# Patient Record
Sex: Male | Born: 1965 | Race: White | Hispanic: No | Marital: Married | Smoking: Never smoker
Health system: Southern US, Community
[De-identification: ages and names within clinical notes are randomized; demographics above are authoritative.]

## PROBLEM LIST (undated history)

## (undated) DIAGNOSIS — B354 Tinea corporis: Secondary | ICD-10-CM

## (undated) DIAGNOSIS — G2581 Restless legs syndrome: Secondary | ICD-10-CM

## (undated) DIAGNOSIS — Z0000 Encounter for general adult medical examination without abnormal findings: Secondary | ICD-10-CM

## (undated) DIAGNOSIS — F3289 Other specified depressive episodes: Secondary | ICD-10-CM

## (undated) DIAGNOSIS — Z7901 Long term (current) use of anticoagulants: Secondary | ICD-10-CM

## (undated) DIAGNOSIS — E7800 Pure hypercholesterolemia, unspecified: Secondary | ICD-10-CM

## (undated) DIAGNOSIS — Z86718 Personal history of other venous thrombosis and embolism: Secondary | ICD-10-CM

## (undated) DIAGNOSIS — N529 Male erectile dysfunction, unspecified: Secondary | ICD-10-CM

## (undated) DIAGNOSIS — Z5181 Encounter for therapeutic drug level monitoring: Secondary | ICD-10-CM

## (undated) DIAGNOSIS — F988 Other specified behavioral and emotional disorders with onset usually occurring in childhood and adolescence: Secondary | ICD-10-CM

## (undated) DIAGNOSIS — I2699 Other pulmonary embolism without acute cor pulmonale: Secondary | ICD-10-CM

## (undated) DIAGNOSIS — E663 Overweight: Secondary | ICD-10-CM

## (undated) DIAGNOSIS — R7989 Other specified abnormal findings of blood chemistry: Secondary | ICD-10-CM

## (undated) DIAGNOSIS — Z86711 Personal history of pulmonary embolism: Secondary | ICD-10-CM

## (undated) DIAGNOSIS — M19049 Primary osteoarthritis, unspecified hand: Secondary | ICD-10-CM

## (undated) DIAGNOSIS — B9789 Other viral agents as the cause of diseases classified elsewhere: Secondary | ICD-10-CM

## (undated) DIAGNOSIS — D688 Other specified coagulation defects: Secondary | ICD-10-CM

## (undated) DIAGNOSIS — F329 Major depressive disorder, single episode, unspecified: Secondary | ICD-10-CM

## (undated) DIAGNOSIS — R7309 Other abnormal glucose: Secondary | ICD-10-CM

## (undated) HISTORY — DX: Long term (current) use of anticoagulants: Z79.01

## (undated) HISTORY — DX: Other specified depressive episodes: F32.89

## (undated) HISTORY — DX: Restless legs syndrome: G25.81

## (undated) HISTORY — DX: Personal history of other venous thrombosis and embolism: Z86.718

## (undated) HISTORY — DX: Other specified behavioral and emotional disorders with onset usually occurring in childhood and adolescence: F98.8

## (undated) HISTORY — DX: Male erectile dysfunction, unspecified: N52.9

## (undated) HISTORY — DX: Other viral agents as the cause of diseases classified elsewhere: B97.89

## (undated) HISTORY — DX: Primary osteoarthritis, unspecified hand: M19.049

## (undated) HISTORY — DX: Tinea corporis: B35.4

## (undated) HISTORY — DX: Pure hypercholesterolemia, unspecified: E78.00

## (undated) HISTORY — DX: Encounter for general adult medical examination without abnormal findings: Z00.00

## (undated) HISTORY — DX: Encounter for therapeutic drug level monitoring: Z51.81

## (undated) HISTORY — DX: Other pulmonary embolism without acute cor pulmonale: I26.99

## (undated) HISTORY — DX: Other abnormal glucose: R73.09

## (undated) HISTORY — DX: Overweight: E66.3

## (undated) HISTORY — DX: Major depressive disorder, single episode, unspecified: F32.9

---

## 1992-07-03 HISTORY — PX: LIPOMA EXCISION: SHX5283

## 2001-11-29 ENCOUNTER — Encounter: Payer: Self-pay | Admitting: Family Medicine

## 2002-02-07 HISTORY — PX: VASECTOMY: SHX75

## 2003-11-16 ENCOUNTER — Encounter: Payer: Self-pay | Admitting: Family Medicine

## 2004-12-13 ENCOUNTER — Ambulatory Visit: Payer: Self-pay | Admitting: Internal Medicine

## 2005-02-24 ENCOUNTER — Ambulatory Visit: Payer: Self-pay | Admitting: Family Medicine

## 2005-03-07 ENCOUNTER — Ambulatory Visit: Payer: Self-pay | Admitting: Family Medicine

## 2005-03-14 ENCOUNTER — Ambulatory Visit: Payer: Self-pay | Admitting: Family Medicine

## 2006-07-05 ENCOUNTER — Ambulatory Visit: Payer: Self-pay | Admitting: Family Medicine

## 2006-07-10 ENCOUNTER — Ambulatory Visit: Payer: Self-pay | Admitting: Professional

## 2006-07-16 ENCOUNTER — Ambulatory Visit: Payer: Self-pay | Admitting: Professional

## 2006-07-24 ENCOUNTER — Ambulatory Visit: Payer: Self-pay | Admitting: Professional

## 2006-07-31 ENCOUNTER — Ambulatory Visit: Payer: Self-pay | Admitting: Professional

## 2006-08-07 ENCOUNTER — Ambulatory Visit: Payer: Self-pay | Admitting: Professional

## 2006-08-13 ENCOUNTER — Ambulatory Visit: Payer: Self-pay | Admitting: Family Medicine

## 2007-07-10 ENCOUNTER — Ambulatory Visit: Payer: Self-pay | Admitting: Family Medicine

## 2007-07-10 DIAGNOSIS — F329 Major depressive disorder, single episode, unspecified: Secondary | ICD-10-CM

## 2007-07-19 ENCOUNTER — Ambulatory Visit: Payer: Self-pay | Admitting: Internal Medicine

## 2007-07-26 ENCOUNTER — Ambulatory Visit: Payer: Self-pay | Admitting: Family Medicine

## 2007-08-07 ENCOUNTER — Encounter: Payer: Self-pay | Admitting: Family Medicine

## 2007-08-07 DIAGNOSIS — E7800 Pure hypercholesterolemia, unspecified: Secondary | ICD-10-CM

## 2007-08-07 DIAGNOSIS — R7309 Other abnormal glucose: Secondary | ICD-10-CM

## 2008-01-23 ENCOUNTER — Ambulatory Visit: Payer: Self-pay | Admitting: Family Medicine

## 2008-03-23 ENCOUNTER — Ambulatory Visit: Payer: Self-pay | Admitting: Family Medicine

## 2008-03-23 DIAGNOSIS — F988 Other specified behavioral and emotional disorders with onset usually occurring in childhood and adolescence: Secondary | ICD-10-CM | POA: Insufficient documentation

## 2008-03-25 ENCOUNTER — Telehealth: Payer: Self-pay | Admitting: Family Medicine

## 2008-04-08 ENCOUNTER — Ambulatory Visit: Payer: Self-pay | Admitting: Family Medicine

## 2008-06-10 ENCOUNTER — Ambulatory Visit: Payer: Self-pay | Admitting: Family Medicine

## 2008-06-10 ENCOUNTER — Telehealth: Payer: Self-pay | Admitting: Family Medicine

## 2008-06-11 LAB — CONVERTED CEMR LAB
Alkaline Phosphatase: 48 units/L (ref 39–117)
Bilirubin, Direct: 0.1 mg/dL (ref 0.0–0.3)
GFR calc Af Amer: 85 mL/min
GFR calc non Af Amer: 70 mL/min
LDL Cholesterol: 122 mg/dL — ABNORMAL HIGH (ref 0–99)
Microalb Creat Ratio: 2.1 mg/g (ref 0.0–30.0)
Potassium: 4.1 meq/L (ref 3.5–5.1)
Sodium: 142 meq/L (ref 135–145)
TSH: 1 microintl units/mL (ref 0.35–5.50)
Total CHOL/HDL Ratio: 5.3
VLDL: 27 mg/dL (ref 0–40)

## 2008-06-17 ENCOUNTER — Ambulatory Visit: Payer: Self-pay | Admitting: Family Medicine

## 2008-08-17 ENCOUNTER — Ambulatory Visit: Payer: Self-pay | Admitting: Family Medicine

## 2008-08-31 ENCOUNTER — Ambulatory Visit: Payer: Self-pay | Admitting: Internal Medicine

## 2008-09-12 ENCOUNTER — Ambulatory Visit: Payer: Self-pay | Admitting: Cardiology

## 2008-09-12 ENCOUNTER — Inpatient Hospital Stay: Payer: Self-pay | Admitting: Internal Medicine

## 2008-09-14 ENCOUNTER — Encounter: Payer: Self-pay | Admitting: Family Medicine

## 2008-09-16 ENCOUNTER — Ambulatory Visit: Payer: Self-pay | Admitting: Internal Medicine

## 2008-09-16 ENCOUNTER — Encounter: Payer: Self-pay | Admitting: Family Medicine

## 2008-10-01 ENCOUNTER — Ambulatory Visit: Payer: Self-pay | Admitting: Internal Medicine

## 2008-10-13 ENCOUNTER — Telehealth: Payer: Self-pay | Admitting: Family Medicine

## 2008-10-18 ENCOUNTER — Encounter: Payer: Self-pay | Admitting: Family Medicine

## 2008-10-20 ENCOUNTER — Ambulatory Visit: Payer: Self-pay | Admitting: Family Medicine

## 2008-10-21 LAB — CONVERTED CEMR LAB
INR: 7 (ref 0.8–1.0)
Prothrombin Time: 67.8 s (ref 10.9–13.3)

## 2008-10-23 ENCOUNTER — Ambulatory Visit: Payer: Self-pay | Admitting: Family Medicine

## 2008-10-28 ENCOUNTER — Ambulatory Visit: Payer: Self-pay | Admitting: Family Medicine

## 2008-10-28 LAB — CONVERTED CEMR LAB: INR: 1.5

## 2008-10-29 ENCOUNTER — Encounter: Payer: Self-pay | Admitting: Family Medicine

## 2008-10-31 ENCOUNTER — Ambulatory Visit: Payer: Self-pay | Admitting: Internal Medicine

## 2008-11-04 ENCOUNTER — Ambulatory Visit: Payer: Self-pay | Admitting: Family Medicine

## 2008-11-04 LAB — CONVERTED CEMR LAB: INR: 4.4

## 2008-11-13 ENCOUNTER — Telehealth: Payer: Self-pay | Admitting: Family Medicine

## 2008-11-13 ENCOUNTER — Ambulatory Visit: Payer: Self-pay | Admitting: Family Medicine

## 2008-11-13 LAB — CONVERTED CEMR LAB
INR: 1.9
Prothrombin Time: 17.1 s

## 2008-12-04 ENCOUNTER — Ambulatory Visit: Payer: Self-pay | Admitting: Family Medicine

## 2008-12-04 LAB — CONVERTED CEMR LAB: INR: 2.8

## 2009-01-01 ENCOUNTER — Telehealth: Payer: Self-pay | Admitting: Family Medicine

## 2009-01-01 ENCOUNTER — Ambulatory Visit: Payer: Self-pay | Admitting: Family Medicine

## 2009-01-01 LAB — CONVERTED CEMR LAB: Prothrombin Time: 16.7 s

## 2009-01-08 ENCOUNTER — Ambulatory Visit: Payer: Self-pay | Admitting: Family Medicine

## 2009-01-12 ENCOUNTER — Telehealth: Payer: Self-pay | Admitting: Family Medicine

## 2009-01-14 ENCOUNTER — Ambulatory Visit: Payer: Self-pay | Admitting: Family Medicine

## 2009-01-14 ENCOUNTER — Encounter (INDEPENDENT_AMBULATORY_CARE_PROVIDER_SITE_OTHER): Payer: Self-pay | Admitting: *Deleted

## 2009-01-15 LAB — CONVERTED CEMR LAB: Prothrombin Time: 38.3 s — ABNORMAL HIGH (ref 11.6–15.2)

## 2009-01-22 ENCOUNTER — Ambulatory Visit: Payer: Self-pay | Admitting: Family Medicine

## 2009-01-22 LAB — CONVERTED CEMR LAB
INR: 1.8
Prothrombin Time: 16.4 s

## 2009-02-08 ENCOUNTER — Ambulatory Visit: Payer: Self-pay | Admitting: Family Medicine

## 2009-02-08 LAB — CONVERTED CEMR LAB: Prothrombin Time: 20.1 s

## 2009-02-24 ENCOUNTER — Telehealth: Payer: Self-pay | Admitting: Family Medicine

## 2009-03-12 ENCOUNTER — Ambulatory Visit: Payer: Self-pay | Admitting: Family Medicine

## 2009-03-12 LAB — CONVERTED CEMR LAB: INR: 2.9

## 2009-04-01 ENCOUNTER — Telehealth: Payer: Self-pay | Admitting: Family Medicine

## 2009-04-09 ENCOUNTER — Ambulatory Visit: Payer: Self-pay | Admitting: Family Medicine

## 2009-05-07 ENCOUNTER — Ambulatory Visit: Payer: Self-pay | Admitting: Family Medicine

## 2009-05-07 LAB — CONVERTED CEMR LAB
INR: 1.9
Prothrombin Time: 17 s

## 2009-06-01 ENCOUNTER — Telehealth: Payer: Self-pay | Admitting: Family Medicine

## 2009-06-03 ENCOUNTER — Ambulatory Visit: Payer: Self-pay | Admitting: Family Medicine

## 2009-06-03 LAB — CONVERTED CEMR LAB: INR: 2.4

## 2009-06-07 ENCOUNTER — Telehealth: Payer: Self-pay | Admitting: Family Medicine

## 2009-07-01 ENCOUNTER — Ambulatory Visit: Payer: Self-pay | Admitting: Family Medicine

## 2009-07-01 ENCOUNTER — Telehealth: Payer: Self-pay | Admitting: Family Medicine

## 2009-07-01 LAB — CONVERTED CEMR LAB
INR: 1.6
Prothrombin Time: 15.6 s

## 2009-07-15 ENCOUNTER — Ambulatory Visit: Payer: Self-pay | Admitting: Family Medicine

## 2009-07-29 ENCOUNTER — Ambulatory Visit: Payer: Self-pay | Admitting: Family Medicine

## 2009-07-29 LAB — CONVERTED CEMR LAB
INR: 3.3
Prothrombin Time: 22 s

## 2009-07-30 LAB — CONVERTED CEMR LAB
Free T4: 0.7 ng/dL (ref 0.6–1.6)
T4, Total: 6.5 ug/dL (ref 5.0–12.5)

## 2009-08-25 ENCOUNTER — Telehealth: Payer: Self-pay | Admitting: Family Medicine

## 2009-08-27 ENCOUNTER — Ambulatory Visit: Payer: Self-pay | Admitting: Family Medicine

## 2009-09-09 ENCOUNTER — Ambulatory Visit: Payer: Self-pay | Admitting: Family Medicine

## 2009-09-09 DIAGNOSIS — N529 Male erectile dysfunction, unspecified: Secondary | ICD-10-CM | POA: Insufficient documentation

## 2009-09-09 DIAGNOSIS — B354 Tinea corporis: Secondary | ICD-10-CM | POA: Insufficient documentation

## 2009-09-09 DIAGNOSIS — E663 Overweight: Secondary | ICD-10-CM

## 2009-10-07 ENCOUNTER — Encounter: Payer: Self-pay | Admitting: Family Medicine

## 2009-10-07 ENCOUNTER — Telehealth: Payer: Self-pay | Admitting: Family Medicine

## 2009-10-08 ENCOUNTER — Ambulatory Visit: Payer: Self-pay | Admitting: Family Medicine

## 2009-11-01 ENCOUNTER — Ambulatory Visit: Payer: Self-pay | Admitting: Family Medicine

## 2009-11-01 LAB — CONVERTED CEMR LAB
INR: 5.3
Prothrombin Time: 63.7 s

## 2009-11-08 ENCOUNTER — Ambulatory Visit: Payer: Self-pay | Admitting: Family Medicine

## 2009-11-08 LAB — CONVERTED CEMR LAB: Prothrombin Time: 25.7 s

## 2009-11-22 ENCOUNTER — Ambulatory Visit: Payer: Self-pay | Admitting: Family Medicine

## 2009-11-22 LAB — CONVERTED CEMR LAB
INR: 2.4
Prothrombin Time: 28.7 s

## 2009-12-27 ENCOUNTER — Ambulatory Visit: Payer: Self-pay | Admitting: Family Medicine

## 2009-12-27 LAB — CONVERTED CEMR LAB
INR: 3.2
Prothrombin Time: 38.3 s

## 2009-12-28 ENCOUNTER — Telehealth: Payer: Self-pay | Admitting: Family Medicine

## 2010-01-24 ENCOUNTER — Ambulatory Visit: Payer: Self-pay | Admitting: Family Medicine

## 2010-02-07 ENCOUNTER — Encounter (INDEPENDENT_AMBULATORY_CARE_PROVIDER_SITE_OTHER): Payer: Self-pay | Admitting: *Deleted

## 2010-02-21 ENCOUNTER — Ambulatory Visit: Payer: Self-pay | Admitting: Family Medicine

## 2010-02-21 LAB — CONVERTED CEMR LAB: INR: 5.5

## 2010-02-22 ENCOUNTER — Telehealth: Payer: Self-pay | Admitting: Family Medicine

## 2010-02-24 ENCOUNTER — Telehealth (INDEPENDENT_AMBULATORY_CARE_PROVIDER_SITE_OTHER): Payer: Self-pay | Admitting: *Deleted

## 2010-02-28 ENCOUNTER — Ambulatory Visit: Payer: Self-pay | Admitting: Family Medicine

## 2010-02-28 LAB — CONVERTED CEMR LAB: Prothrombin Time: 40.4 s

## 2010-03-14 ENCOUNTER — Ambulatory Visit: Payer: Self-pay | Admitting: Family Medicine

## 2010-03-14 LAB — CONVERTED CEMR LAB: Prothrombin Time: 49.1 s

## 2010-03-21 ENCOUNTER — Telehealth: Payer: Self-pay | Admitting: Family Medicine

## 2010-03-28 ENCOUNTER — Ambulatory Visit: Payer: Self-pay | Admitting: Family Medicine

## 2010-03-28 LAB — CONVERTED CEMR LAB: Prothrombin Time: 35.6 s

## 2010-04-25 ENCOUNTER — Ambulatory Visit: Payer: Self-pay | Admitting: Family Medicine

## 2010-04-25 DIAGNOSIS — B9789 Other viral agents as the cause of diseases classified elsewhere: Secondary | ICD-10-CM

## 2010-04-25 LAB — CONVERTED CEMR LAB: Prothrombin Time: 28.3 s

## 2010-05-23 ENCOUNTER — Ambulatory Visit: Payer: Self-pay | Admitting: Family Medicine

## 2010-05-23 LAB — CONVERTED CEMR LAB
INR: 2.5
Prothrombin Time: 30.1 s

## 2010-06-20 ENCOUNTER — Ambulatory Visit: Payer: Self-pay | Admitting: Family Medicine

## 2010-06-20 ENCOUNTER — Telehealth: Payer: Self-pay | Admitting: Family Medicine

## 2010-06-20 LAB — CONVERTED CEMR LAB
INR: 1.5
Prothrombin Time: 17.5 s

## 2010-06-28 ENCOUNTER — Ambulatory Visit: Payer: Self-pay | Admitting: Family Medicine

## 2010-06-28 LAB — CONVERTED CEMR LAB
INR: 2.3
Prothrombin Time: 27 s

## 2010-07-18 ENCOUNTER — Ambulatory Visit
Admission: RE | Admit: 2010-07-18 | Discharge: 2010-07-18 | Payer: Self-pay | Source: Home / Self Care | Attending: Family Medicine | Admitting: Family Medicine

## 2010-07-18 LAB — CONVERTED CEMR LAB: Prothrombin Time: 20.2 s

## 2010-07-29 ENCOUNTER — Ambulatory Visit: Admit: 2010-07-29 | Payer: Self-pay | Admitting: Family Medicine

## 2010-08-01 ENCOUNTER — Ambulatory Visit: Admit: 2010-08-01 | Payer: Self-pay | Admitting: Family Medicine

## 2010-08-02 NOTE — Letter (Signed)
Summary: Note from pt to Dr.Yareni Creps-re:  Blood Pressure  Note from pt to Dr.Rolinda Impson-re:  Blood Pressure   Imported By: Beau Fanny 10/08/2009 10:23:53  _____________________________________________________________________  External Attachment:    Type:   Image     Comment:   External Document

## 2010-08-02 NOTE — Progress Notes (Signed)
Summary: refill request for vyvanse, levitra  Phone Note Refill Request Call back at Home Phone [phone] Message from:  Patient  Refills Requested: Medication #1:  VYVANSE 50 MG CAPS one tab by mouth every day.  Medication #2:  LEVITRA 10 MG TABS one tab by mouth one hour prior to desired activity. Please call pt when ready.  Initial call taken by: Lowella Petties CMA,  December 28, 2009 11:22 AM  Follow-up for Phone Call        Pt to pick up. Follow-up by: Lowella Petties CMA,  December 28, 2009 1:51 PM    Prescriptions: LEVITRA 10 MG TABS (VARDENAFIL HCL) one tab by mouth one hour prior to desired activity.  #12 x 12   Entered and Authorized by:   Shaune Leeks MD   Signed by:   Shaune Leeks MD on 12/28/2009   Method used:   Print then Give to Patient   RxID:   [id number] VYVANSE 50 MG CAPS (LISDEXAMFETAMINE DIMESYLATE) one tab by mouth every day.  #30 x 0   Entered and Authorized by:   Shaune Leeks MD   Signed by:   Shaune Leeks MD on 12/28/2009   Method used:   Print then Give to Patient   RxID:   [id number]

## 2010-08-02 NOTE — Letter (Signed)
Summary: Nadara Eaton letter  [place]  [address] [place], [place] [address]   Phone: [phone]  Fax: [phone]       02/07/2010 MRN: [id number]  [facility] [address] [place], [place]  [address]  Dear Mr. Sanger,  [facility] [place] and [place] announce the retirement of Arta Silence, M.D., from full-time practice at the [facility] office effective December 30, 2009 and his plans of returning part-time.  It is important to Dr. Hetty Ely and to our practice that you understand that [facility] has seven physicians in our office for your health care needs.  We will continue to offer the same exceptional care that you have today.    Dr. Hetty Ely has spoken to many of you about his plans for retirement and returning part-time in the fall.   We will continue to work with you through the transition to schedule appointments for you in the office and meet the high standards that [facility] is committed to.   Again, it is with great pleasure that we share the news that Dr. Hetty Ely will return to [facility] in October of 2011 with a reduced schedule.    If you have any questions, or would like to request an appointment with one of our physicians, please call us at [phone] and press the option for Scheduling an appointment.  We take pleasure in providing you with excellent patient care and look forward to seeing you at your next office visit.  Our [facility] Physicians are:  Tillman Abide, M.D. Laurita Quint, M.D. Roxy Manns, M.D. Kerby Nora, M.D. Hannah Beat, M.D. Ruthe Mannan, M.D. We proudly welcomed Raechel Ache, M.D. and Eustaquio [facility], M.D. to the practice in July/August 2011.  Sincerely,  [facility]

## 2010-08-02 NOTE — Progress Notes (Signed)
Summary: refill request for vyvanse, coumadin  Phone Note Refill Request Message from:  Patient  Refills Requested: Medication #1:  VYVANSE 50 MG CAPS one tab by mouth every day.  Medication #2:  COUMADIN 2.5 MG TABS one daily as directed due to protime [BMN]. Pt has appt to come in on friday and will pick up scripts then.  He is requesting 90 day supply of coumadin.  Initial call taken by: Lowella Petties CMA,  August 25, 2009 4:28 PM  Follow-up for Phone Call        Have pt see me fotr next Vyvanse refill. Follow-up by: Shaune Leeks MD,  August 25, 2009 4:57 PM    New/Updated Medications: COUMADIN 2.5 MG TABS (WARFARIN SODIUM) one daily as directed due to protime, currently one Tu and Thu [BMN] Prescriptions: COUMADIN 2.5 MG TABS (WARFARIN SODIUM) one daily as directed due to protime, currently one Tu and Thu Brand medically necessary #50 x 3   Entered and Authorized by:   Shaune Leeks MD   Signed by:   Shaune Leeks MD on 08/25/2009   Method used:   Print then Give to Patient   RxID:   [id number] COUMADIN 5 MG TABS (WARFARIN SODIUM) 1 daily by mouth except on tuesday and thursday takes 2.5  #90 x 3   Entered and Authorized by:   Shaune Leeks MD   Signed by:   Shaune Leeks MD on 08/25/2009   Method used:   Print then Give to Patient   RxID:   [id number] VYVANSE 50 MG CAPS (LISDEXAMFETAMINE DIMESYLATE) one tab by mouth every day.  #30 x 0   Entered and Authorized by:   Shaune Leeks MD   Signed by:   Shaune Leeks MD on 08/25/2009   Method used:   Print then Give to Patient   RxID:   [id number]

## 2010-08-02 NOTE — Progress Notes (Signed)
Summary: Biling issue....  Phone Note Outgoing Call   Call placed to: Patient Summary of Call: Pt came into the office inquiring about his bill, charges has been sent to outside collections. Sp w/ Synetta Fail w/ [employer]. Says pymt has been paid and she will handle  removing from  outside collections and crediting patients acct.  Called pt gave him this information.Daine Gip  February 24, 2010 4:47 PM  Initial call taken by: Daine Gip,  February 24, 2010 4:47 PM

## 2010-08-02 NOTE — Progress Notes (Signed)
Summary: Rx Vyvanse  Phone Note Call from Patient Call back at [phone]   Caller: Patient Call For: Dr. Para March Summary of Call: Patient needs a written rx for his Vyvanse. Call when ready for pickup. Initial call taken by: Sydell Axon LPN,  March 21, 2010 11:05 AM  Follow-up for Phone Call        signed.  please notify patient.  Follow-up by: Crawford Givens MD,  March 21, 2010 12:42 PM  Additional Follow-up for Phone Call Additional follow up Details #1::        Patient Advised. Prescription left at front desk.  Additional Follow-up by: Delilah Shan CMA (AAMA),  March 21, 2010 1:46 PM    New/Updated Medications: VYVANSE 50 MG CAPS (LISDEXAMFETAMINE DIMESYLATE) one tab by mouth every day. Prescriptions: VYVANSE 50 MG CAPS (LISDEXAMFETAMINE DIMESYLATE) one tab by mouth every day.  #30 x 0   Entered and Authorized by:   Crawford Givens MD   Signed by:   Crawford Givens MD on 03/21/2010   Method used:   Print then Give to Patient   RxID:   [id number]

## 2010-08-02 NOTE — Progress Notes (Signed)
Summary: regarding BP  Phone Note Call from Patient   Caller: Patient Call For: Shaune Leeks MD Summary of Call: Pt has sent a fax regarding his BP. Fax is on your shelf. Initial call taken by: Lowella Petties CMA,  October 07, 2009 12:06 PM  Follow-up for Phone Call        See Email in chart. Please let pt know, BPs will require trmt IF BP REMAINS HIGH OVER TIME. His pressures right now are elevated and will need trmt if nort controlled in andother manner. His own recent FH reinforces my own suggestion to you that weight loss will probably make your current elevated BP normalize. LOSE WEIGHT. Do whatever your sister ansd mother have done to do that. Seeing me May 2 still reasonable unless something changes for the worse....then just call for an appt, come in and  be seen. Follow-up by: Shaune Leeks MD,  October 07, 2009 12:59 PM  Additional Follow-up for Phone Call Additional follow up Details #1::        Advised pt, he agrees. Additional Follow-up by: Lowella Petties CMA,  October 07, 2009 1:04 PM

## 2010-08-02 NOTE — Assessment & Plan Note (Signed)
Summary: REFILL VYVANSE, NEEDS PROTIME   Vital Signs:  Patient profile:   45 year old male Weight:      252 pounds Temp:     98.1 degrees F oral Pulse rate:   76 / minute Pulse rhythm:   regular BP sitting:   130 / 98  (left arm) Cuff size:   large  Vitals Entered By: Sydell Axon LPN (September 09, 2009 11:40 AM) CC: Refill medication and protime   History of Present Illness: [name] is a 45 yo caucasian male who presents today for a refill of Vyvanse.   He is also concerned about trying to lose weight.  SInce December he has gained 15 lbs all while eating a diet that is less than 1000 kcal/day.  He states that he is interested in a gluten free diet and wants counseling. He also has 2 lesions on his left thigh that are red and dry about 2 cm in diameter.  He previously had ringworm but states these  look different.   Recently he has used some Levitra  from a friend and states there is a substantial difference when using them and would like a prescription.  Finally, he is having some mild episodic substernal chest pains.  Although they are not nearly as severe as the pain from the PE he had in the past, he is still a little concerned.  Allergies: No Known Drug Allergies  Physical Exam  General:  Well-developed,well-nourished,in no acute distress; alert,appropriate and cooperative throughout examination, mildly obese. Head:  Normocephalic and atraumatic without obvious abnormalities. No apparent alopecia or balding. Eyes:  Conjunctiva clear bilaterally.  Ears:  External ear exam shows no significant lesions or deformities.  Otoscopic examination reveals clear canals, tympanic membranes are intact bilaterally without bulging, retraction, inflammation or discharge. Hearing is grossly normal bilaterally. Nose:  External nasal examination shows no deformity or inflammation. Nasal mucosa are pink and moist without lesions or exudates. Mouth:  Oral mucosa and oropharynx without lesions or  exudates.  Teeth in good repair. Neck:  No deformities, masses, or tenderness noted. Lungs:  Normal respiratory effort, chest expands symmetrically. Lungs are clear to auscultation, no crackles or wheezes. Good lung sounds to the periphery. Heart:  Normal rate and regular rhythm. S1 and S2 normal without gallop, murmur, click, rub or other extra sounds. Abdomen:  Bowel sounds positive,abdomen soft and non-tender without masses, organomegaly or hernias noted. Mildly protuberant. Skin:  2 1.5 cm flaky mildly erythematous somewhat more intensely red outlined macules on upper outer thigh. Psych:  Cognition and judgment appear intact. Alert and cooperative with normal attention span and concentration. No apparent delusions, illusions, hallucinations, ageeable and interactive with good eye contact.   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 Conts to take Vyvanse.  Problem # 2:  CHEST PAIN (ICD-786.50) Assessment: New Pt c/o chest pain while being on a stimulant. I have never been in favor of him being on the stimulant in the first place and we discussed the possibility of him causing the minimal discomfort he has by taking this medication. He is unwilling to stop. .  GERD, Anxiety and Sternocostal irritation could all be possible causes. We discussed classic cardiac chest pain as substernal pressure worse with exertion and better with rest, sweaty and SOB, sometimes radiates to neck or shoulder. His discomfort does not appear to be cardiac. Discussed prophylaxis for GERD and trying relaxation techniques for the anxiety.  Problem #  3:  TINEA CORPORIS (ICD-110.5) Assessment: New Suggested OTC Lamisil and addition of Cortaid after first day. Call if unimproved after one week.  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 His updated medication list for this  problem includes:    Levitra 10 Mg Tabs (Vardenafil hcl) ..... One tab by mouth one hour prior to desired activity.  Problem # 5:  OVERWEIGHT (ICD-278.02) Assessment: Unchanged  Have discussed this in the past. No easy approach. Does not sound like he is on 1000kcal diet! See no reason to go to Gluten free diet and discussed difficulty of actually doing that. Suggest he investigate low carb diets like [place] or SUGAR BUSTERS.  Ht: 71 (10/20/2008)   Wt: 252 (09/09/2009)   BMI: 33.59 (01/14/2009)  Complete Medication List: 1)  Vyvanse 50 Mg Caps (Lisdexamfetamine dimesylate) .... One tab by mouth every day. 2)  Coumadin 2.5 Mg Tabs (Warfarin sodium) .... One daily as directed due to protime, currently one tu and thu 3)  Levitra 10 Mg Tabs (Vardenafil hcl) .... One tab by mouth one hour prior to desired activity.  Other Orders: Protime ([id number])  Patient Instructions: 1)  RTC as needed. Prescriptions: LEVITRA 10 MG TABS (VARDENAFIL HCL) one tab by mouth one hour prior to desired activity.  #12 x 0   Entered and Authorized by:   Shaune Leeks MD   Signed by:   Shaune Leeks MD on 09/09/2009   Method used:   Print then Give to Patient   RxID:   [id number] VYVANSE 50 MG CAPS (LISDEXAMFETAMINE DIMESYLATE) one tab by mouth every day.  #30 x 0   Entered and Authorized by:   Shaune Leeks MD   Signed by:   Shaune Leeks MD on 09/09/2009   Method used:   Print then Give to Patient   RxID:   [id number]   Current Allergies (reviewed today): No known allergies   Laboratory Results   Blood Tests   Date/Time Recieved: September 09, 2009 11:50 AM  Date/Time Reported: September 09, 2009 11:50 AM    INR: 3.3   (Normal Range: 0.88-1.12   Therap INR: 2.0-3.5)      ANTICOAGULATION RECORD PREVIOUS REGIMEN & LAB RESULTS Anticoagulation Diagnosis:  415.19 on  10/20/2008 Previous INR Goal Range:  2.5-3-5 on  10/20/2008 Previous INR:  3.8 on   08/27/2009  Previous Regimen:  2.5mg  qd, 5mg  MWF on  07/15/2009 Previous Coagulation Comments:  . on  07/29/2009  NEW REGIMEN & LAB RESULTS Current INR: 3.3 Regimen: 2.5mg  qd, 5mg  MWF  (no change)  Provider: Elidia Bonenfant      Repeat testing in: 4 weeks MEDICATIONS VYVANSE 50 MG CAPS (LISDEXAMFETAMINE DIMESYLATE) one tab by mouth every day. COUMADIN 2.5 MG TABS (WARFARIN SODIUM) one daily as directed due to protime, currently one Tu and Thu [BMN] LEVITRA 10 MG TABS (VARDENAFIL HCL) one tab by mouth one hour prior to desired activity.   Anticoagulation Visit Questionnaire      Coumadin dose missed/changed:  No      Abnormal Bleeding Symptoms:  No Any diet changes including alcohol intake, vegetables or greens since the last visit:  No Any illnesses or hospitalizations since the last visit:  No Any signs of clotting since the last visit (including chest discomfort, dizziness, shortness of breath, arm tingling, slurred speech, swelling or redness in leg):  No

## 2010-08-02 NOTE — Progress Notes (Signed)
Summary: refill request for vyvanse, levitra  Phone Note Refill Request Call back at Home Phone [phone] Message from:  Patient  Refills Requested: Medication #1:  VYVANSE 50 MG CAPS one tab by mouth every day.  Medication #2:  LEVITRA 10 MG TABS one tab by mouth one hour prior to desired activity. Pt uses [facility], please call pt when vyvanse is ready.  Initial call taken by: Lowella Petties CMA,  February 22, 2010 10:05 AM  Follow-up for Phone Call        Pt had rx sent in for vyvanse 6/11 with 12RF.  please clarify this with pharmacy.  I didn't redo that rx.  Vyvanse signed.  Follow-up by: Crawford Givens MD,  February 22, 2010 11:29 AM  Additional Follow-up for Phone Call Additional follow up Details #1::        Do you mean a Rx. for Levitra on 6/11?  If so, I've taken care of it.  The pharmacy is updating their records to reflect those refills from June. [name] CMA (AAMA)  February 22, 2010 12:00 PM     Additional Follow-up for Phone Call Additional follow up Details #2::    yes, I meant levitra.  thanks.  Follow-up by: Crawford Givens MD,  February 22, 2010 12:36 PM  Prescriptions: VYVANSE 50 MG CAPS (LISDEXAMFETAMINE DIMESYLATE) one tab by mouth every day.  #30 x 0   Entered and Authorized by:   Crawford Givens MD   Signed by:   Crawford Givens MD on 02/22/2010   Method used:   Print then Give to Patient   RxID:   [id number]

## 2010-08-02 NOTE — Assessment & Plan Note (Signed)
Summary: cpx   Vital Signs:  Patient profile:   45 year old male Height:      70 inches Weight:      247.25 pounds BMI:     35.60 Temp:     98.9 degrees F oral Pulse rate:   84 / minute Pulse rhythm:   regular BP sitting:   120 / 84  (right arm) Cuff size:   large  Vitals Entered By: Sydell Axon LPN (Nov 01, 1608 3:14 PM) CC: 30 Minute checkup   History of Present Illness: Pt here for Comp Exam....is still heavy, but 5 pounds lighter than in Mar. He just had his PT checked. He has no complaints today and feels reasonably well.  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 One Tab By Mouth Every Day. 2)  Coumadin 2.5 Mg Tabs (Warfarin Sodium) .... One Daily As Directed Due To Protime, Currently One Tu and Thu 3)  Levitra 10 Mg Tabs (Vardenafil Hcl) .... One Tab By Mouth One Hour Prior To  Desired Activity.  Allergies: No Known Drug Allergies  Past History:  Past Medical History: Last updated: 07/19/2007 Never had OM as a child  Family History: Last updated: 11/01/2009 Father: A  72  ETOH dry since 87// DVT(H/o Phlebitis) Mother: A 88 DVT Skin Ca                      GRANDPARENTS: +PGF RUPTURED APPY                      +PGM ALZHEIMERS                      +MGF ETOH                      +MGM-DM/ CAD, STROKE BROTHER  A48  (Bill) Thyroid Dz SISTER  A 50  (Marilee)  DVT Allergies CV: +MGM HBP: NEGATIVE DM: +MGM GOUT/ARTHRITIS:  PROSTATE CANCER: BREAST/OVARIAN/UTERINE CANCER: NEGATIVE COLON CANCER: DEPRESSION:NEGATIVE ETOH ABUSE: +MGF; + FATHER OTHER: + STROKE MGM  Social History: Last updated: 06/17/2008 Marital Status: Married LIVES WITH WIFE Children: 2 CHILDREN 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  Risk Factors: Alcohol Use: 1 (11/01/2009) Caffeine Use: 0 (11/01/2009) Exercise: yes (11/01/2009)  Risk  Factors: Smoking Status: never (11/01/2009) Passive Smoke Exposure: no (11/01/2009)  Past Surgical History: Lipoma removal right Clavicular head 1994 VASECTOMY:(DR.Valeria Boza):(02/07/2002) HOSP [facility] R Pulm Emboli  09/12/08-09/16/2008  Family History: Father: A  72  ETOH dry since 87// DVT(H/o Phlebitis) Mother: A 20 DVT Skin Ca                      GRANDPARENTS: +PGF RUPTURED APPY                      +PGM ALZHEIMERS                      +MGF ETOH                      +MGM-DM/ CAD, STROKE BROTHER  A48  (Bill) Thyroid Dz SISTER  A 50  (Marilee)  DVT Allergies CV: +MGM HBP: NEGATIVE DM: +MGM GOUT/ARTHRITIS:  PROSTATE CANCER: BREAST/OVARIAN/UTERINE CANCER: NEGATIVE COLON CANCER: DEPRESSION:NEGATIVE ETOH ABUSE: +MGF; + FATHER OTHER: + STROKE MGM  Social History: Does Patient Exercise:  yes  Review of Systems General:  Denies chills, fatigue, fever, sweats, weakness, and weight loss. Eyes:  Denies blurring, discharge, and eye pain;  wears glasses for 3-4 years.. ENT:  Denies decreased hearing, earache, and ringing in ears. CV:  Complains of shortness of breath with exertion; denies chest pain or discomfort, fainting, fatigue, palpitations, swelling of feet, and swelling of hands. Resp:  Denies cough, shortness of breath, and wheezing. GI:  Complains of bloody stools; denies abdominal pain, change in bowel habits, constipation, dark tarry stools, diarrhea, indigestion, loss of appetite, nausea, vomiting, vomiting blood, and yellowish skin color; on TP. GU:  Denies discharge, dysuria, nocturia, urinary frequency, and urinary hesitancy. MS:  Complains of low back pain; denies joint pain, muscle aches, muscle weakness, and stiffness. Derm:  Denies dryness, itching, and rash; rash on lateral left thigh. Neuro:  Denies numbness, poor balance, tingling, and tremors.  Physical Exam  General:  Well-developed,well-nourished,in no acute distress; alert,appropriate and cooperative throughout examination, mildly obese. Head:  Normocephalic and atraumatic without obvious abnormalities. No apparent alopecia or balding. Sinuses NT. Eyes:  Conjunctiva clear bilaterally.  Ears:  External ear exam shows no significant lesions or deformities.  Otoscopic examination reveals clear canals, tympanic membranes are intact bilaterally without bulging, retraction, inflammation or discharge. Hearing is grossly normal bilaterally. Nose:  External nasal examination shows no deformity or inflammation. Nasal mucosa are pink and moist without lesions or exudates. Mouth:  Oral mucosa and oropharynx without lesions or exudates.  Teeth in good repair. Neck:  No deformities, masses, or tenderness noted. Chest Wall:  No deformities, masses, tenderness or gynecomastia noted. Breasts:  No masses or gynecomastia noted Lungs:  Normal respiratory effort, chest expands symmetrically. Lungs are clear to auscultation, no crackles or wheezes. Good lung sounds to the  periphery. Heart:  Normal rate and regular rhythm. S1 and S2 normal without gallop, murmur, click, rub or other extra sounds. Abdomen:  Bowel sounds positive,abdomen soft and non-tender without masses, organomegaly or hernias noted. Mildly protuberant. Rectal:  No external abnormalities noted. Normal sphincter tone. No rectal masses or tenderness. G neg. Genitalia:  Testes bilaterally descended without nodularity, tenderness or masses. No scrotal masses or lesions. No penis lesions or urethral discharge. Prostate:  Prostate gland firm and smooth, no enlargement, nodularity, tenderness, mass, asymmetry or induration. 10-20gms, diff exam due to size. Msk:  No deformity or scoliosis noted  of thoracic or lumbar spine.   Pulses:  R and L carotid,radial,femoral,dorsalis pedis and posterior tibial pulses are full and equal bilaterally Extremities:  No clubbing, cyanosis, edema, or deformity noted with normal full range of motion of all joints.   Neurologic:  No cranial nerve deficits noted. Station and gait are normal. Plantar reflexes are down-going bilaterally. DTRs are symmetrical throughout. Sensory, motor and coordinative functions appear intact. Skin:  Intact without suspicious lesions or rashes, intensely echymotic 4cm area under right lateral areola from hitting gate swung open by the wind on Sat. Cervical Nodes:  No lymphadenopathy noted Inguinal Nodes:  No significant adenopathy Psych:  Cognition and judgment appear intact. Alert and cooperative with normal attention span and concentration. No apparent delusions, illusions, hallucinations   Impression & Recommendations:  Problem # 1:  HEALTH MAINTENANCE EXAM (ICD-V70.0) Assessment Comment Only  Reviewed preventive care protocols, scheduled due services, and updated immunizations.  Problem # 2:  ORGANIC IMPOTENCE (ICD-607.84) Assessment: Improved Doesn't use Levitra much anymore. His updated medication list for this problem includes:     Levitra 10 Mg Tabs (Vardenafil hcl) ..... One tab by mouth one hour prior to desired activity.  Problem # 3:  TINEA CORPORIS (ICD-110.5) Assessment: Improved Now benign rash...suggested Eucerin cream two times a day.  Problem # 4:  OVERWEIGHT (ICD-278.02) Assessment: Improved  Has dropped 5 pounds. Needs to continue efforts at weight loss.  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 Significant FH of coaguable Disease. W/U was negative. Tried to caution to stay active. His updated medication list for this problem includes:    Coumadin 2.5 Mg Tabs (Warfarin sodium) ..... One daily as directed due to protime, currently one tu and thu    Warfarin Sodium 1 Mg Tabs (Warfarin sodium) .[name]... Take 3 by mouth daily or as directed  Reviewed the following: PT: 22.0 (07/29/2009)   INR: 3.5 (10/08/2009)    Next Protime: 4 weeks (dated on 10/08/2009)  Problem # 6:  ATTENTION DEFICIT DISORDER, ADULT (ICD-314.00) Assessment: Unchanged Basically stable. Has cut back on use of stimulants.  Problem # 7:  HYPERCHOLESTEROLEMIA (ICD-272.0) Assessment: Unchanged Adequate in past, not great. Discussed diet and exercise. Needs fasting labs. Labs Reviewed: SGOT: 18 (06/10/2008)   SGPT: 29 (06/10/2008)   HDL:34.9 (06/10/2008)  LDL:122 (06/10/2008)  Chol:184 (06/10/2008)  Trig:136 (06/10/2008)  Problem # 8:  HYPERGLYCEMIA (ICD-790.29) Assessment: Unchanged Needs fasting labs to assess. Weight loss will help.  Problem # 9:  DEPRESSION (ICD-311) Assessment: Unchanged Stable on no medication.  Complete Medication List: 1)  Vyvanse 50 Mg Caps (Lisdexamfetamine dimesylate) .... One tab by mouth every day. 2)  Coumadin 2.5 Mg Tabs (Warfarin sodium) .... One daily as directed due to protime, currently one tu and thu 3)  Levitra 10 Mg Tabs (Vardenafil  hcl) .... One tab by mouth one hour prior to desired activity. 4)  Warfarin Sodium 1 Mg Tabs (Warfarin sodium) .... Take 3 by mouth daily or as directed  Patient Instructions: 1)  Try Eucerin Cream to skin. 2)  Fasting labs with PT. Prescriptions: WARFARIN SODIUM 1 MG TABS (WARFARIN SODIUM) Take 3 by mouth daily or as directed  #90 x 3   Entered by:   Sydell Axon LPN   Authorized by:   Shaune Leeks MD   Signed by:  Sydell Axon LPN on 30/86/5784   Method used:   Print then Give to Patient   RxID:   [id number] WARFARIN SODIUM 1 MG TABS (WARFARIN SODIUM) Take 3 by mouth daily or as directed  #90 x 1   Entered by:   Sydell Axon LPN   Authorized by:   Shaune Leeks MD   Signed by:   Sydell Axon LPN on 02/72/5366   Method used:   Electronically to        [phone]* (retail)       [address] [place], [place]  [address]       Ph: [phone] or [phone]       Fax: [phone]   RxID:   [id number]   Current Allergies (reviewed today): No known allergies

## 2010-08-02 NOTE — Assessment & Plan Note (Signed)
Summary: COLD SXS   Vital Signs:  Patient profile:   45 year old male Height:      70 inches Weight:      245.50 pounds BMI:     35.35 Temp:     98.3 degrees F oral Pulse rate:   80 / minute Pulse rhythm:   regular BP sitting:   120 / 84  (right arm) Cuff size:   large  Vitals Entered By: Delilah Shan CMA Duncan Dull) (April 25, 2010 3:29 PM) CC: Cold symptoms, head congestion, runny nose.   History of Present Illness: "I got a cold."  Wife was sick last week.  Rhinorrhea and sleep is disrupted.  Coughing/clearing throat; worse at night.  No FCNAV.  ST and post nasal gtt.  Has used otc robitussen and mucinex.  No change in appetite.  1 episode of possible blood in stool, but this may have been related to coloring of cough syrup.  No other episodes of bleeding.    Allergies: No Known Drug Allergies  Social History: Marital Status: Married LIVES WITH WIFE Children: 2 CHILDREN 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 nonsmoker.   Review of Systems       See HPI.  Otherwise negative.    Physical Exam  General:  GEN: nad, alert and oriented HEENT: mucous membranes moist, TM w/o erythema, nasal epithelium injected, OP with cobblestoning NECK: supple w/o LA CV: rrr. PULM: ctab, no inc wob ABD: soft, +bs EXT: no edema    Impression & Recommendations:  Problem # 1:  VIRAL INFECTION (ICD-079.99) no indication for antibiotics.  Will start tussionex with sedation caution.  This should resolve in the next few days.  supportive tx in the meantime.  His updated medication list for this problem includes:    Tussionex Pennkinetic Er 10-8 Mg/6ml Lqcr (Hydrocod polst-chlorphen polst) .Marland KitchenMarland KitchenMarland KitchenMarland Kitchen 5ml by mouth two times a day as needed for cough, sedation caution  Complete Medication List: 1)  Vyvanse 50 Mg Caps (Lisdexamfetamine dimesylate) .... One tab by mouth every day. 2)  Coumadin 2.5 Mg Tabs (Warfarin sodium) .... One daily as directed due to protime, currently one tu and thu 3)   Levitra 10 Mg Tabs (Vardenafil hcl) .... One tab by mouth one hour prior to desired activity. 4)  Warfarin Sodium 1 Mg Tabs (Warfarin sodium) .... Take 3 by mouth daily or as directed 5)  Coumadin 3 Mg Tabs (Warfarin sodium) .... Take one by mouth daily or as directed 6)  Tussionex Pennkinetic Er 10-8 Mg/30ml Lqcr (Hydrocod polst-chlorphen polst) .... 5ml by mouth two times a day as needed for cough, sedation caution  Patient Instructions: 1)  Use the cough syrup as needed.  It may make you drowsy.   2)  Get plenty of rest, drink lots of clear liquids, and use Tylenol for fever and comfort. Return in 7-10 days if you're not better: sooner if you'er feeling worse.  Prescriptions: Sandria Senter ER 10-8 MG/5ML LQCR (HYDROCOD POLST-CHLORPHEN POLST) 5ml by mouth two times a day as needed for cough, sedation caution  #4oz x 0   Entered and Authorized by:   Crawford Givens MD   Signed by:   Crawford Givens MD on 04/25/2010   Method used:   Print then Give to Patient   RxID:   [id number]    Orders Added: 1)  Est. Patient Level III [14782]    Current Allergies (reviewed today): No known allergies

## 2010-08-04 NOTE — Progress Notes (Signed)
Summary: refill request for vyvanse, levitra  Phone Note Refill Request Call back at Home Phone [phone] Message from:  Patient  Refills Requested: Medication #1:  VYVANSE 50 MG CAPS one tab by mouth every day.  Medication #2:  LEVITRA 10 MG TABS one tab by mouth one hour prior to desired activity. Please call pt when ready.  Initial call taken by: Lowella Petties CMA, AAMA,  June 20, 2010 12:55 PM  Follow-up for Phone Call        signed.  Follow-up by: Crawford Givens MD,  June 20, 2010 1:48 PM  Additional Follow-up for Phone Call Additional follow up Details #1::        Detailed message left for patient on his voicemail that rx's are up front and ready for pickup. Additional Follow-up by: Sydell Axon LPN,  June 20, 2010 2:16 PM    Prescriptions: LEVITRA 10 MG TABS (VARDENAFIL HCL) one tab by mouth one hour prior to desired activity.  #12 x 12   Entered and Authorized by:   Crawford Givens MD   Signed by:   Crawford Givens MD on 06/20/2010   Method used:   Print then Give to Patient   RxID:   [id number] VYVANSE 50 MG CAPS (LISDEXAMFETAMINE DIMESYLATE) one tab by mouth every day.  #30 x 0   Entered and Authorized by:   Crawford Givens MD   Signed by:   Crawford Givens MD on 06/20/2010   Method used:   Print then Give to Patient   RxID:   [id number]     Allergies: No Known Drug Allergies

## 2010-08-10 ENCOUNTER — Encounter: Payer: Self-pay | Admitting: Family Medicine

## 2010-08-10 ENCOUNTER — Ambulatory Visit (INDEPENDENT_AMBULATORY_CARE_PROVIDER_SITE_OTHER): Payer: 59

## 2010-08-10 DIAGNOSIS — I2699 Other pulmonary embolism without acute cor pulmonale: Secondary | ICD-10-CM

## 2010-08-10 DIAGNOSIS — Z7901 Long term (current) use of anticoagulants: Secondary | ICD-10-CM

## 2010-08-10 DIAGNOSIS — Z5181 Encounter for therapeutic drug level monitoring: Secondary | ICD-10-CM

## 2010-08-10 LAB — CONVERTED CEMR LAB: INR: 2.9

## 2010-09-02 ENCOUNTER — Telehealth: Payer: Self-pay | Admitting: Family Medicine

## 2010-09-08 NOTE — Progress Notes (Signed)
Summary: refill request for vyvanse, levitra  Phone Note Refill Request Call back at [phone] Message from:  Patient  Refills Requested: Medication #1:  VYVANSE 50 MG CAPS one tab by mouth every day.  Medication #2:  LEVITRA 10 MG TABS one tab by mouth one hour prior to desired activity. Please call pt when ready, ok to wait until next wednesday on the vyvanse. Wants the levitra called to [facility] pharmacy.  Initial call taken by: Lowella Petties CMA, AAMA,  September 02, 2010 3:22 PM  Follow-up for Phone Call        please give vyvanse to patient.  He had refill on the levitra on the last rx.  thanks.  Follow-up by: Crawford Givens MD,  September 02, 2010 4:03 PM  Additional Follow-up for Phone Call Additional follow up Details #1::        Patient Advised. Prescription left at front desk.  Additional Follow-up by: Delilah Shan CMA Masami Plata Dull),  September 02, 2010 4:41 PM    Prescriptions: VYVANSE 50 MG CAPS (LISDEXAMFETAMINE DIMESYLATE) one tab by mouth every day.  #30 x 0   Entered and Authorized by:   Crawford Givens MD   Signed by:   Crawford Givens MD on 09/02/2010   Method used:   Print then Give to Patient   RxID:   [id number]

## 2010-09-09 ENCOUNTER — Ambulatory Visit: Payer: 59

## 2010-09-15 ENCOUNTER — Encounter: Payer: Self-pay | Admitting: Family Medicine

## 2010-09-15 ENCOUNTER — Ambulatory Visit (INDEPENDENT_AMBULATORY_CARE_PROVIDER_SITE_OTHER): Payer: 59

## 2010-09-15 DIAGNOSIS — Z7901 Long term (current) use of anticoagulants: Secondary | ICD-10-CM

## 2010-09-15 DIAGNOSIS — I2699 Other pulmonary embolism without acute cor pulmonale: Secondary | ICD-10-CM

## 2010-09-15 DIAGNOSIS — Z5181 Encounter for therapeutic drug level monitoring: Secondary | ICD-10-CM

## 2010-09-20 NOTE — Medication Information (Signed)
Summary: protime/tw   Indication 1: 415.19 PT 37.3 INR RANGE 2.5-3-5           Allergies: No Known Drug Allergies  Anticoagulation Management History:      Negative risk factors for bleeding include an age less than 47 years old.  The bleeding index is 'low risk'.  Negative CHADS2 values include Age > 76 years old.  His last INR was 2.9 and today's INR is 3.1.  Prothrombin time is 37.3.    Anticoagulation Management Assessment/Plan:      The patient's current anticoagulation dose is Coumadin 2.5 mg tabs: one daily as directed due to protime, currently one Tu and Thu, Warfarin sodium 1 mg tabs: Take 3 by mouth daily or as directed, Coumadin 3 mg tabs: Take one by mouth daily or as directed.  The next INR is due 4 weeks.         ANTICOAGULATION RECORD PREVIOUS REGIMEN & LAB RESULTS Anticoagulation Diagnosis:  415.19 on  10/20/2008 Previous INR Goal Range:  2.5-3-5 on  10/20/2008 Previous INR:  2.9 on  08/10/2010 Previous Coumadin Dose(mg):  3 mg daily on  07/18/2010 Previous Regimen:  3mg  qd on  08/10/2010 Previous Coagulation Comments:  . on  11/22/2009  NEW REGIMEN & LAB RESULTS Current INR: 3.1 Current Coumadin Dose(mg): 3 mg daily Regimen: 3 mg daily  Provider: Kameah Rawl Repeat testing in: 4 weeks Dose has been reviewed with patient or caretaker during this visit. Reviewed by: Celso Sickle  Anticoagulation Visit Questionnaire Coumadin dose missed/changed:  No Abnormal Bleeding Symptoms:  No  Any diet changes including alcohol intake, vegetables or greens since the last visit:  No Any illnesses or hospitalizations since the last visit:  No Any signs of clotting since the last visit (including chest discomfort, dizziness, shortness of breath, arm tingling, slurred speech, swelling or redness in leg):  No  MEDICATIONS VYVANSE 50 MG CAPS (LISDEXAMFETAMINE DIMESYLATE) one tab by mouth every day. COUMADIN 2.5 MG TABS (WARFARIN SODIUM) one daily as directed due to  protime, currently one Tu and Thu [BMN] LEVITRA 10 MG TABS (VARDENAFIL HCL) one tab by mouth one hour prior to desired activity. WARFARIN SODIUM 1 MG TABS (WARFARIN SODIUM) Take 3 by mouth daily or as directed COUMADIN 3 MG TABS (WARFARIN SODIUM) Take one by mouth daily or as directed TUSSIONEX PENNKINETIC ER 10-8 MG/5ML LQCR (HYDROCOD POLST-CHLORPHEN POLST) 5ml by mouth two times a day as needed for cough, sedation caution    Laboratory Results   Blood Tests   Date/Time Received: September 15, 2010 1:15 PM Date/Time Reported: September 15, 2010 1:15 PM  PT: 37.3 s   (Normal Range: 10.6-13.4)  INR: 3.1   (Normal Range: 0.88-1.12   Therap INR: 2.0-3.5)

## 2010-10-06 ENCOUNTER — Telehealth: Payer: Self-pay | Admitting: Radiology

## 2010-10-06 DIAGNOSIS — Z5181 Encounter for therapeutic drug level monitoring: Secondary | ICD-10-CM

## 2010-10-06 DIAGNOSIS — Z7901 Long term (current) use of anticoagulants: Secondary | ICD-10-CM

## 2010-10-06 DIAGNOSIS — I2699 Other pulmonary embolism without acute cor pulmonale: Secondary | ICD-10-CM

## 2010-10-06 NOTE — Telephone Encounter (Signed)
Order for INR

## 2010-10-07 IMAGING — US US EXTREM LOW VENOUS BILAT
1 series · 17 of 24 positions shown · non-contrast
Comparison: none

REASON FOR EXAM: Pt with PE, eval for dvt
COMMENTS:

[Series 1: us extrem low venous bilat · 17 of 44 slices shown]
[im 1/44]
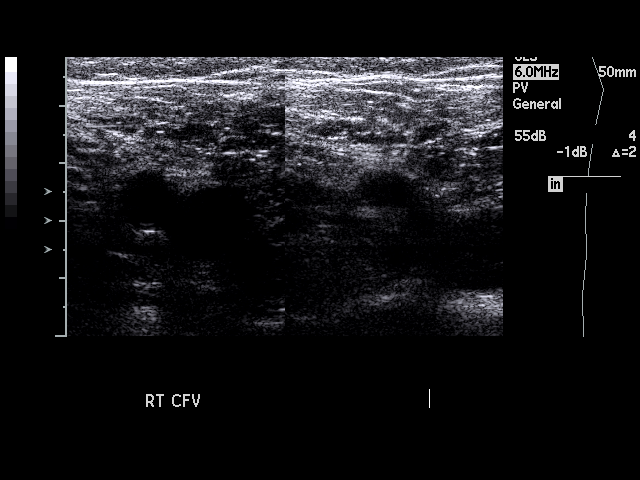
[im 4/44]
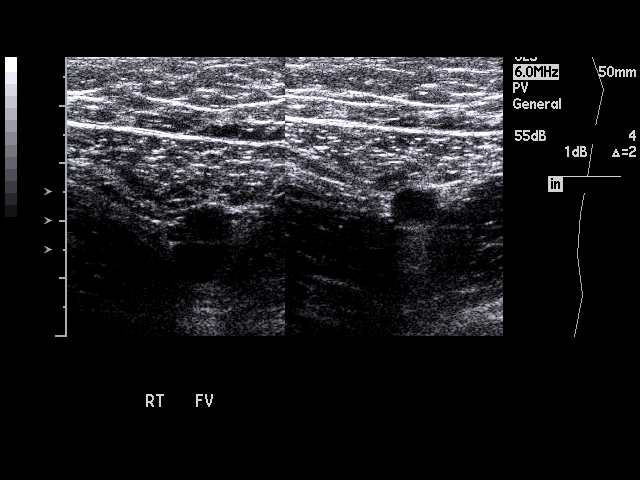
[im 6/44]
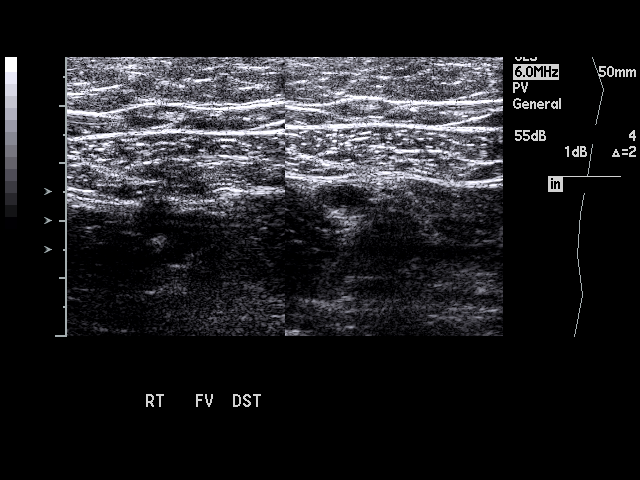
[im 8/44]
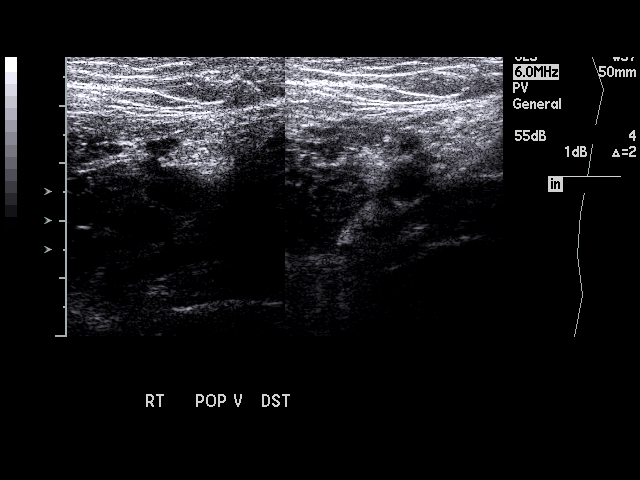
[im 12/44]
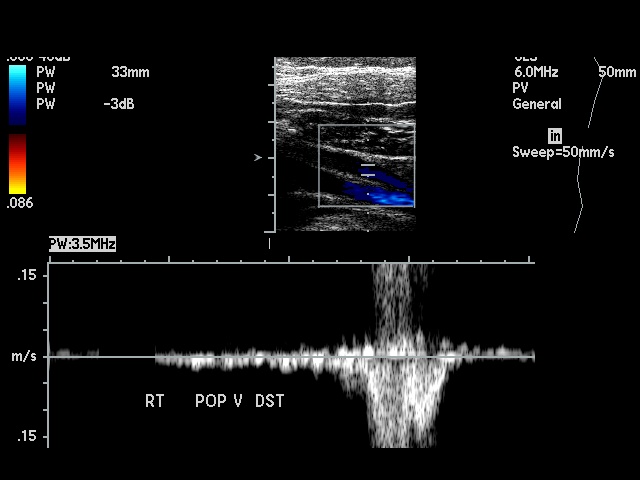
[im 14/44]
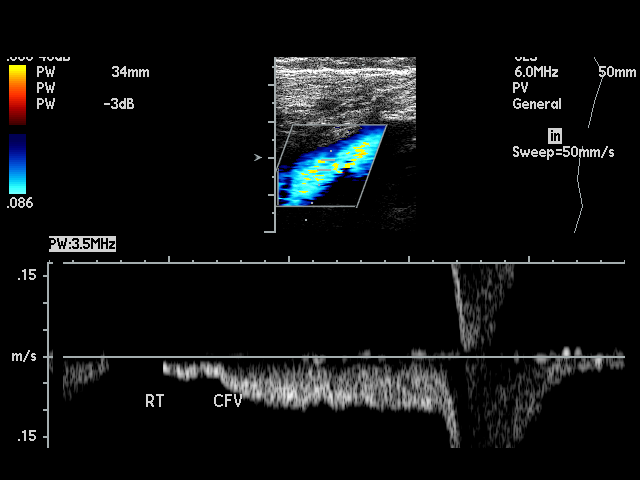
[im 17/44]
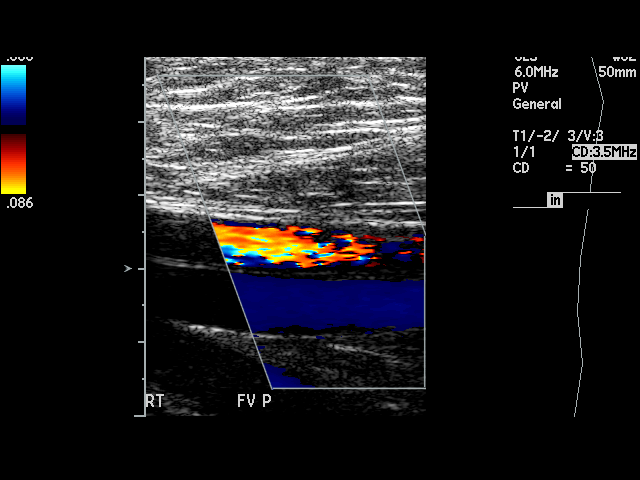
[im 19/44]
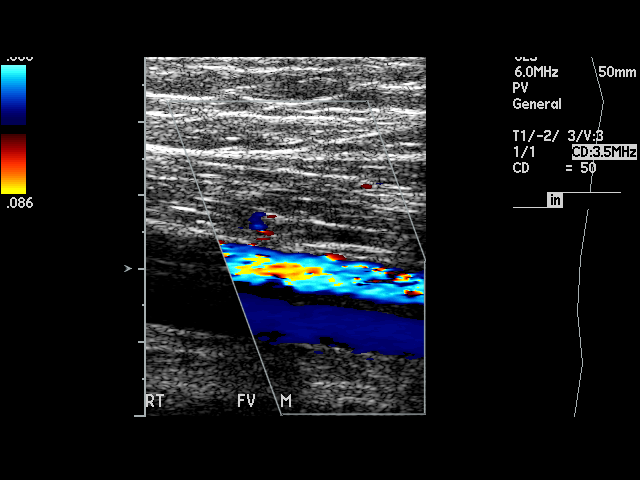
[im 23/44]
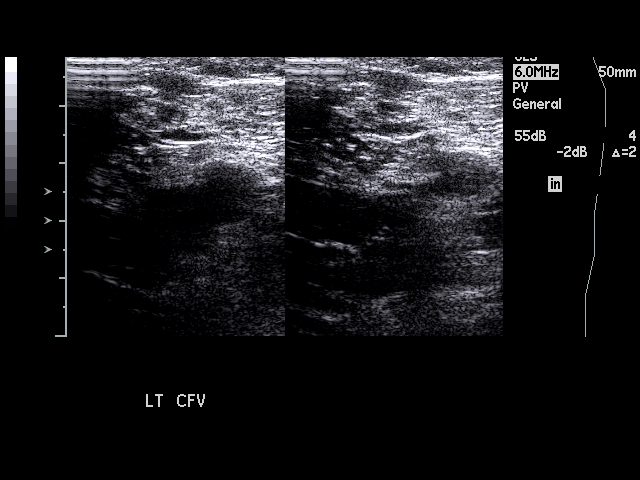
[im 25/44]
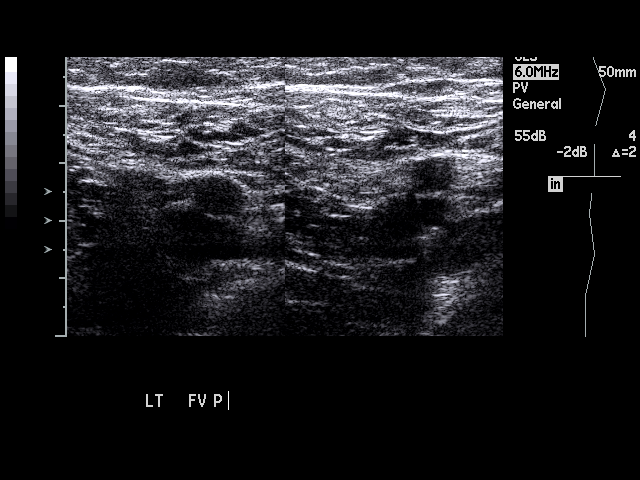
[im 27/44]
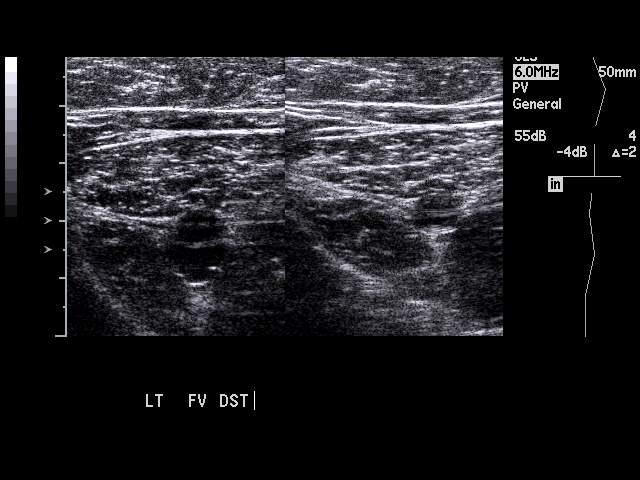
[im 30/44]
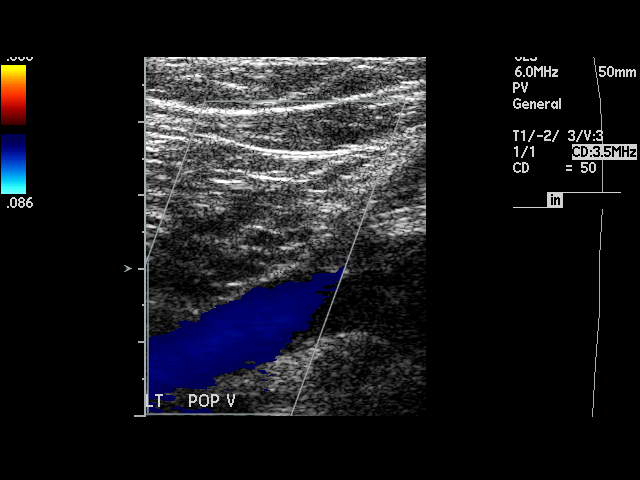
[im 32/44]
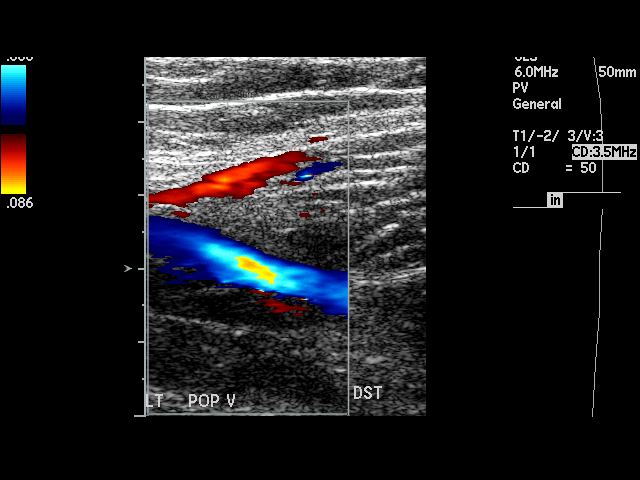
[im 36/44]
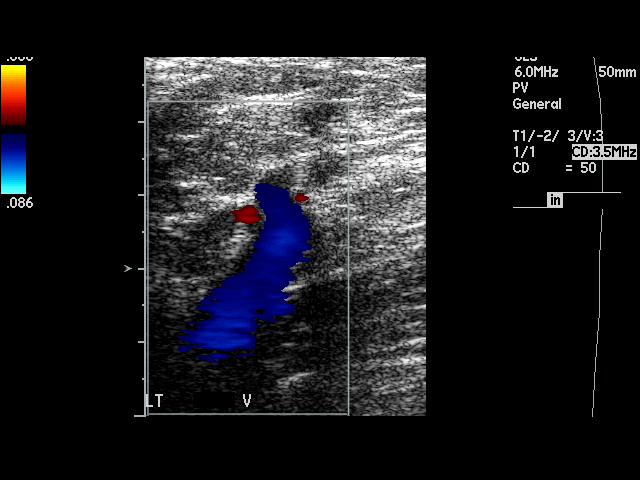
[im 38/44]
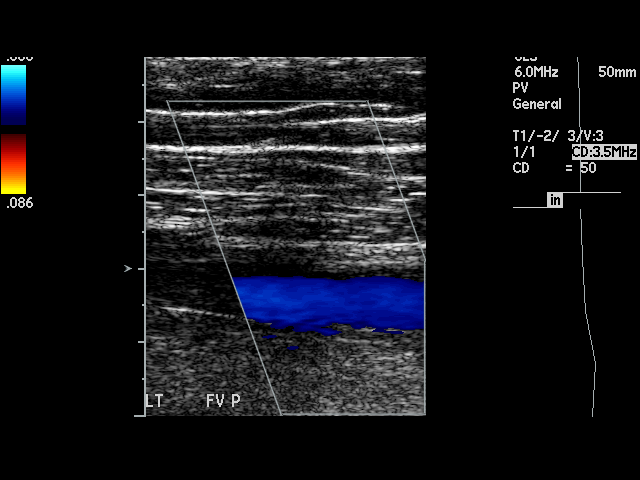
[im 40/44]
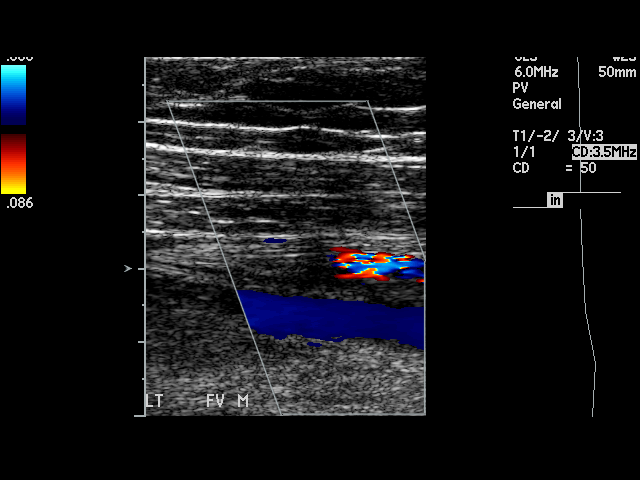
[im 44/44]
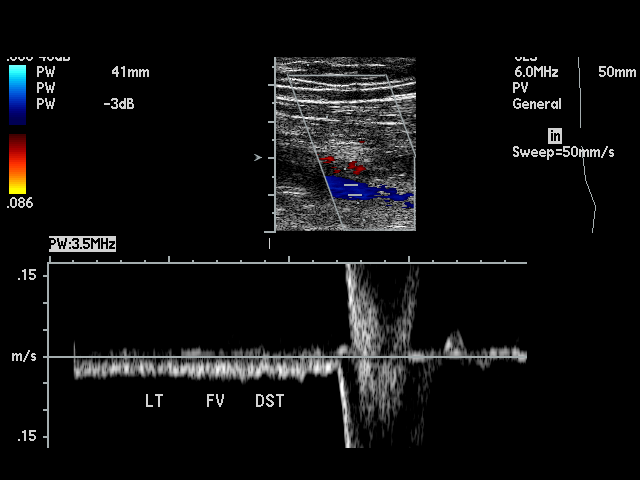

[17 of 24 positions shown; findings below may reference images not displayed]

PROCEDURE:     US  - US DOPPLER LOW EXTR BILATERAL  - September 12, 2008  [DATE]

RESULT:     Gray scale, Duplex, SPECTRAL waveform imaging was performed of
the deep venous structures of the RIGHT and LEFT lower extremities.

There is no evidence of increased echogenicity, non-compressibility,
abnormal waveform or abnormal gray scale flow. There is appropriate response
to Valsalva and augmentation within the interrogated vessels.
IMPRESSION: 1. No sonographic evidence of deep venous thrombus within the interrogated
structures of the RIGHT or LEFT lower extremity.

## 2010-10-07 IMAGING — CT CT CHEST W/ CM
1 series · 15 of 32 positions shown, 19 images · IV contrast (APPLIED)
Comparison: none

REASON FOR EXAM: chest pain, strong family history of PE
COMMENTS:

[Series 4: soft tissue · axial · 0.74mm/px · z∈[-354,-72]mm · 15 of 106 slices shown, 19 images]
[im 8/106  mediastinal]
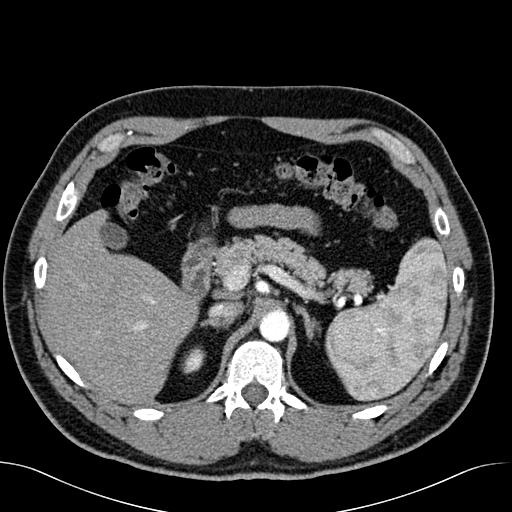
[im 8/106  lung]
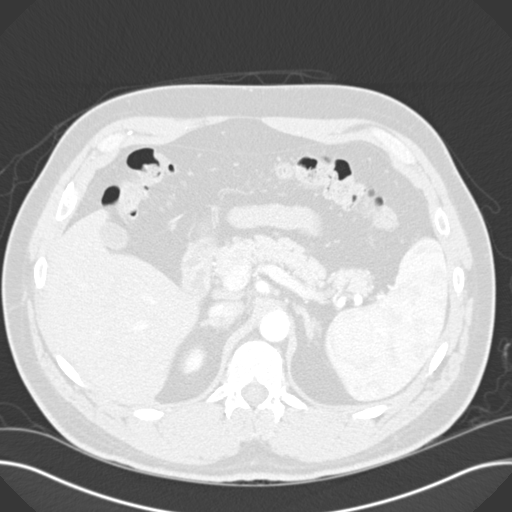
[im 16/106  lung]
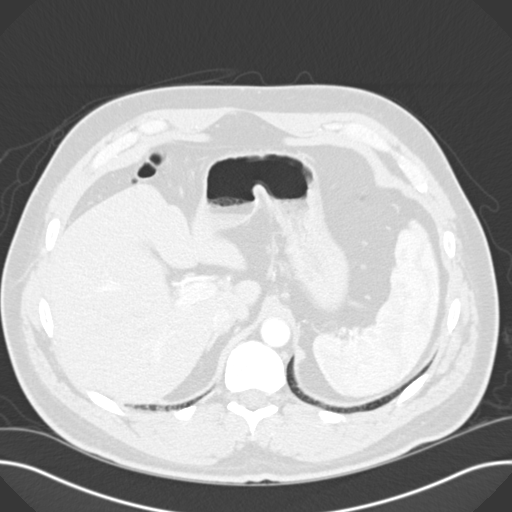
[im 22/106  lung]
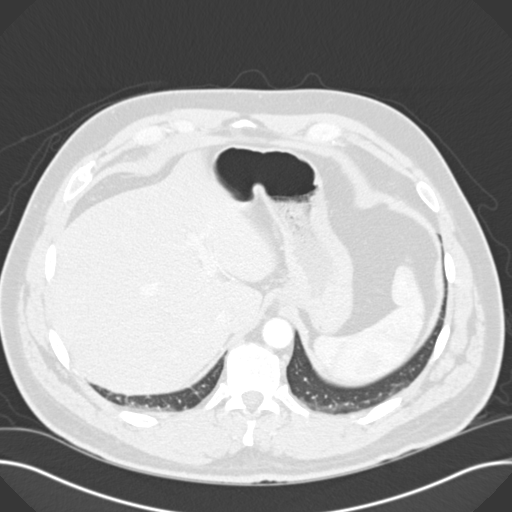
[im 28/106  lung]
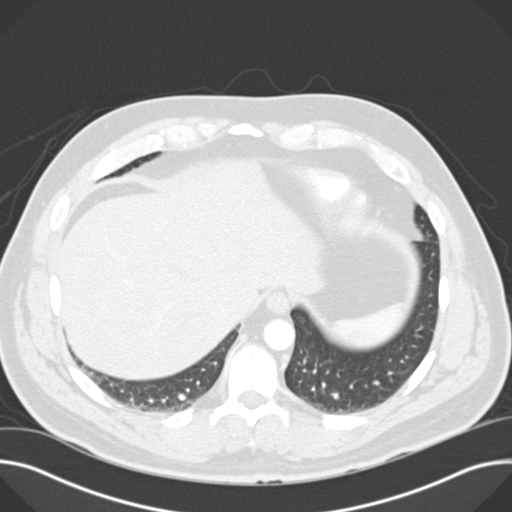
[im 36/106  mediastinal]
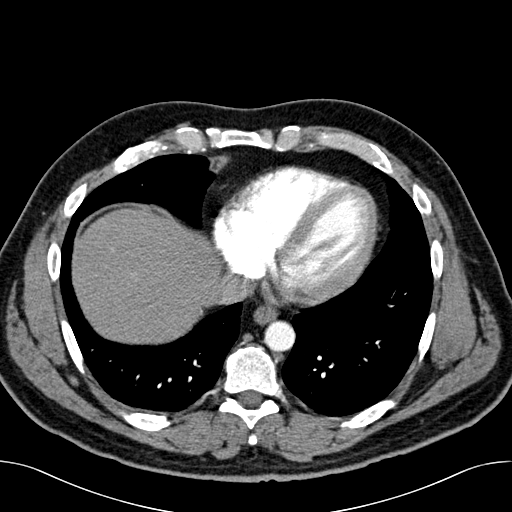
[im 36/106  lung]
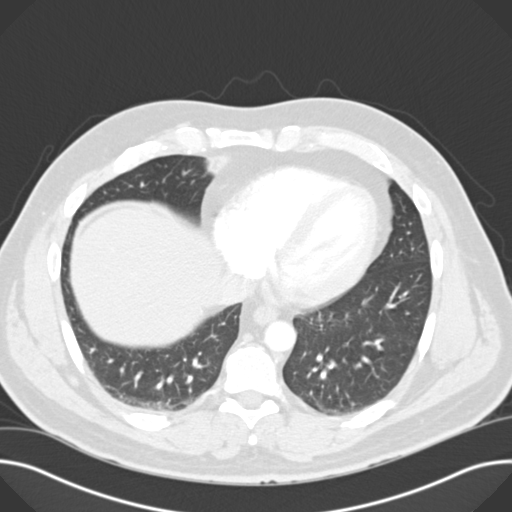
[im 43/106  lung]
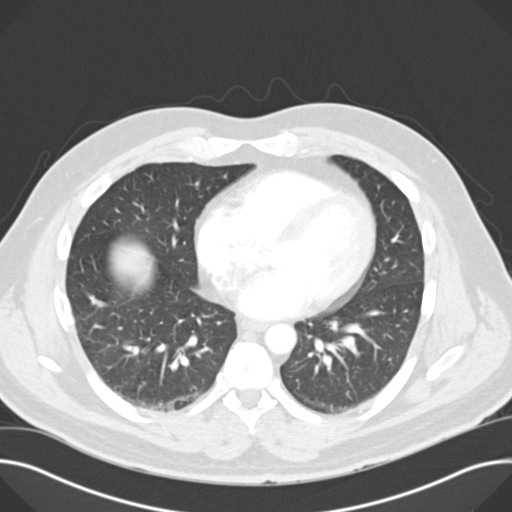
[im 51/106  lung]
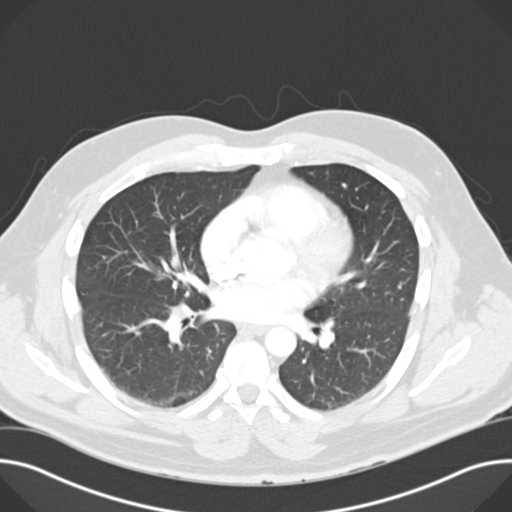
[im 56/106  lung]
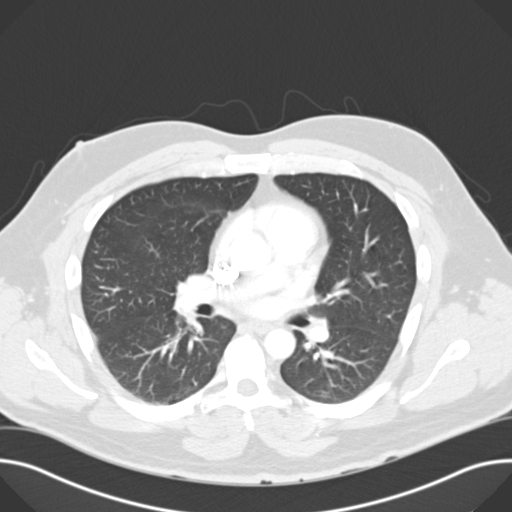
[im 63/106  mediastinal]
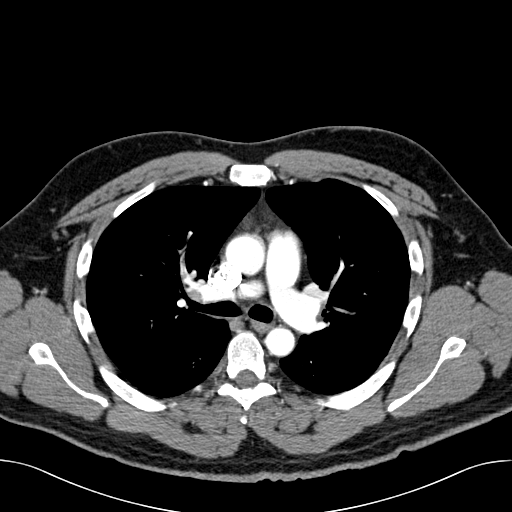
[im 63/106  lung]
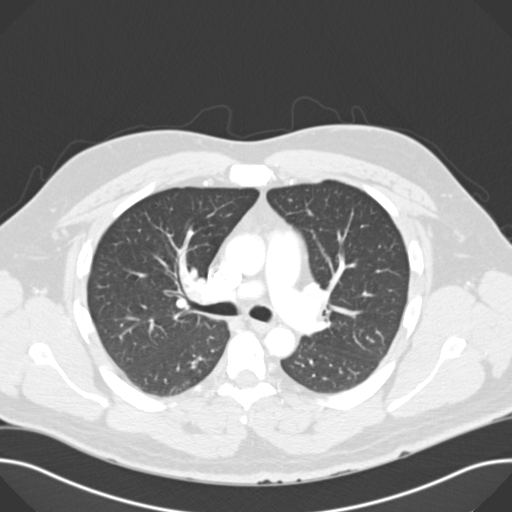
[im 67/106  lung]
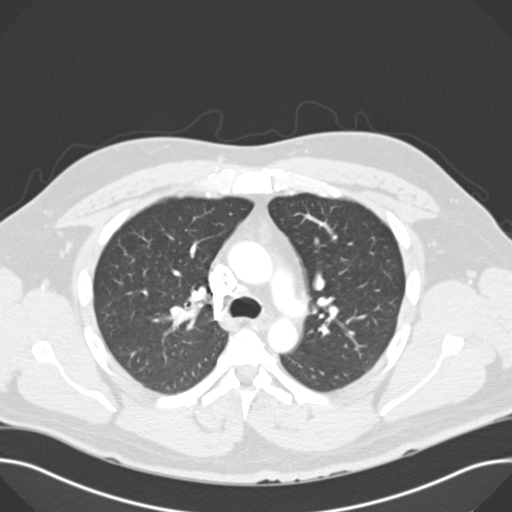
[im 74/106  lung]
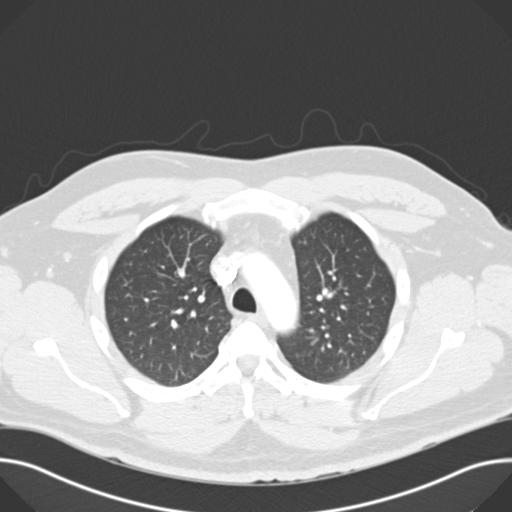
[im 82/106  lung]
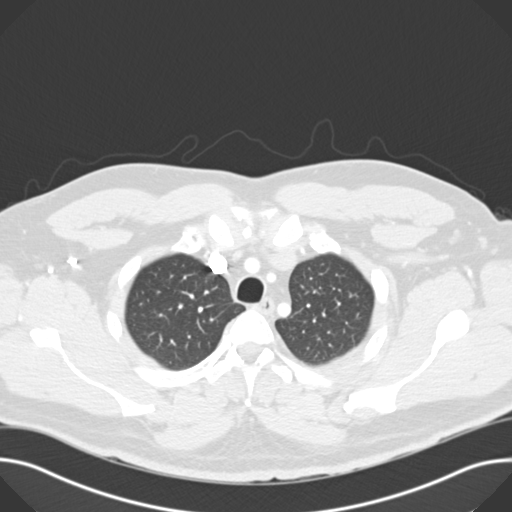
[im 86/106  mediastinal]
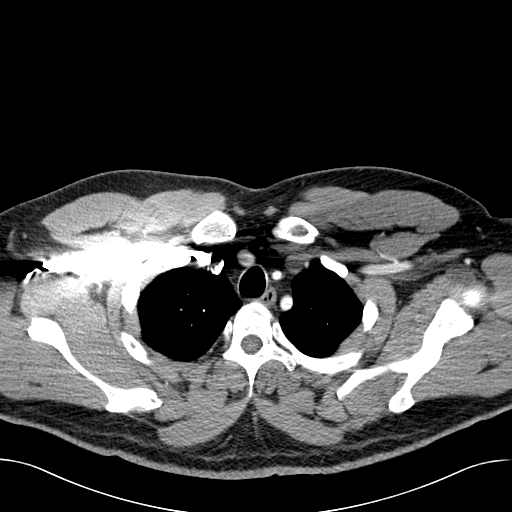
[im 86/106  lung]
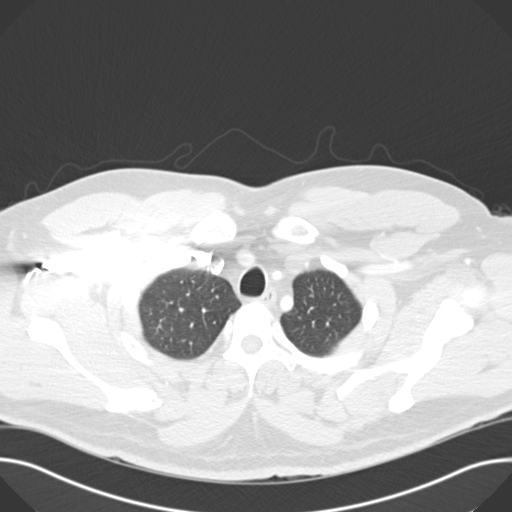
[im 94/106  lung]
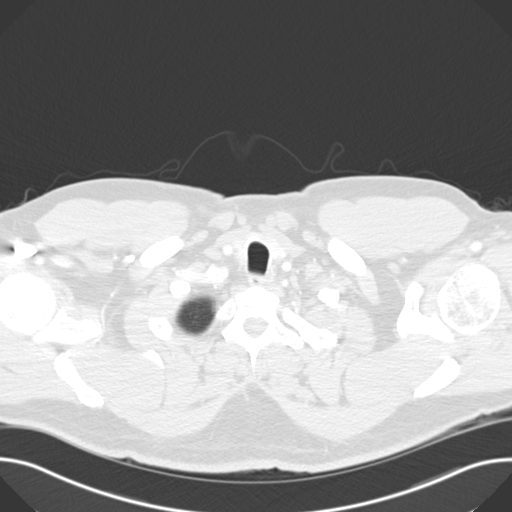
[im 102/106  lung]
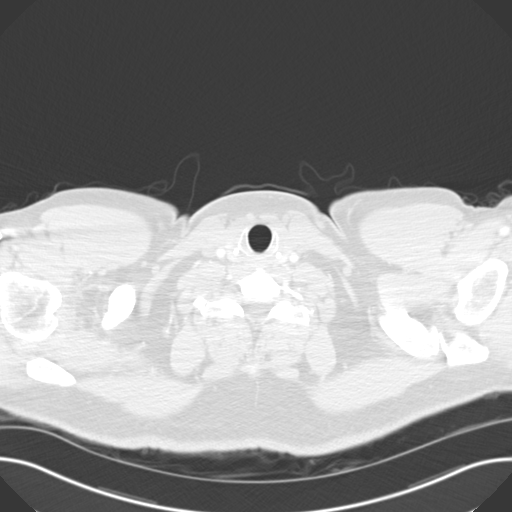

[15 of 32 positions shown; findings below may reference images not displayed]

PROCEDURE:     CT  - CT CHEST (FOR PE) W  - September 12, 2008  [DATE]

RESULT:     Helical 3 mm sections were obtained from the thoracic inlet
through the lung bases status post intravenous administration of 3 ml of
Isovue 370.

Evaluation of the mediastinum and hilar regions and structures demonstrates
no evidence of mediastinum or hilar adenopathy nor masses. Filling defects
are appreciated within the RIGHT upper lobe pulmonary artery extending to
segmental branches as well as within the bifurcation of the RIGHT middle
lobe and RIGHT lower lobe pulmonary arteries. There is evidence of extension
into segmental branches of the RIGHT lower lobe. These areas of filling
defects are partially occlusive. Contrast is appreciated within the vessels.
Evaluation of the lung parenchyma demonstrates no evidence of focal
infiltrates, effusions, edema, masses or nodules. The visualized upper
abdominal viscera demonstrate no gross abnormalities.
IMPRESSION: 1. Findings consistent with pulmonary arterial embolic disease involving the
RIGHT upper, middle and lower lobe pulmonary arteries as well as segmental
branches. There appears to be moderate burden of clot.

Dr. Bassel of the Emergency Department was informed of these findings at
the time of the initial interpretation.

## 2010-10-07 IMAGING — CR DG CHEST 2V
1 series · 2 of 2 positions shown · non-contrast
Comparison: none

REASON FOR EXAM: cough
COMMENTS:

PROCEDURE:     DXR - DXR CHEST PA (OR AP) AND LATERAL  - September 12, 2008 [DATE]
RESULT:     The lungs are clear. The cardiac silhouette and visualized bony
skeleton are unremarkable.

[Series 1: view not recorded · 0.17mm/px · 2 of 2 slices shown]
[im 1/2]
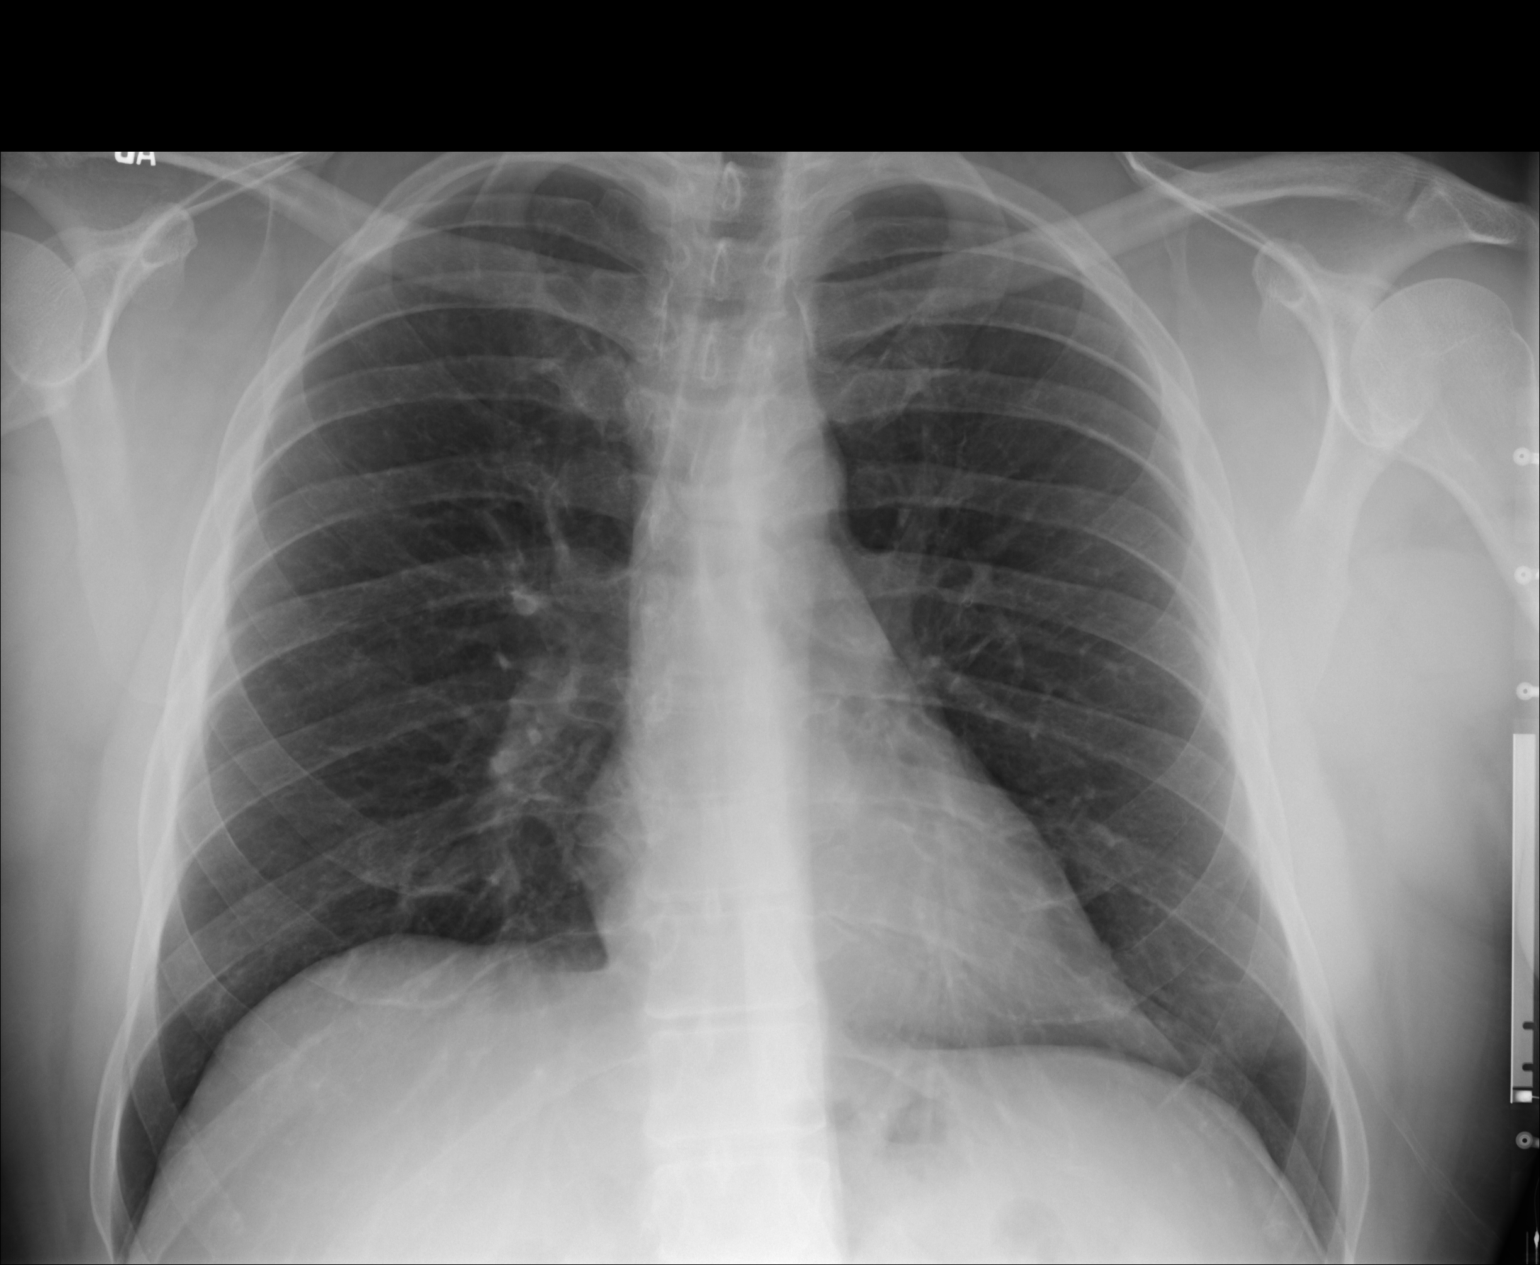
[im 2/2]
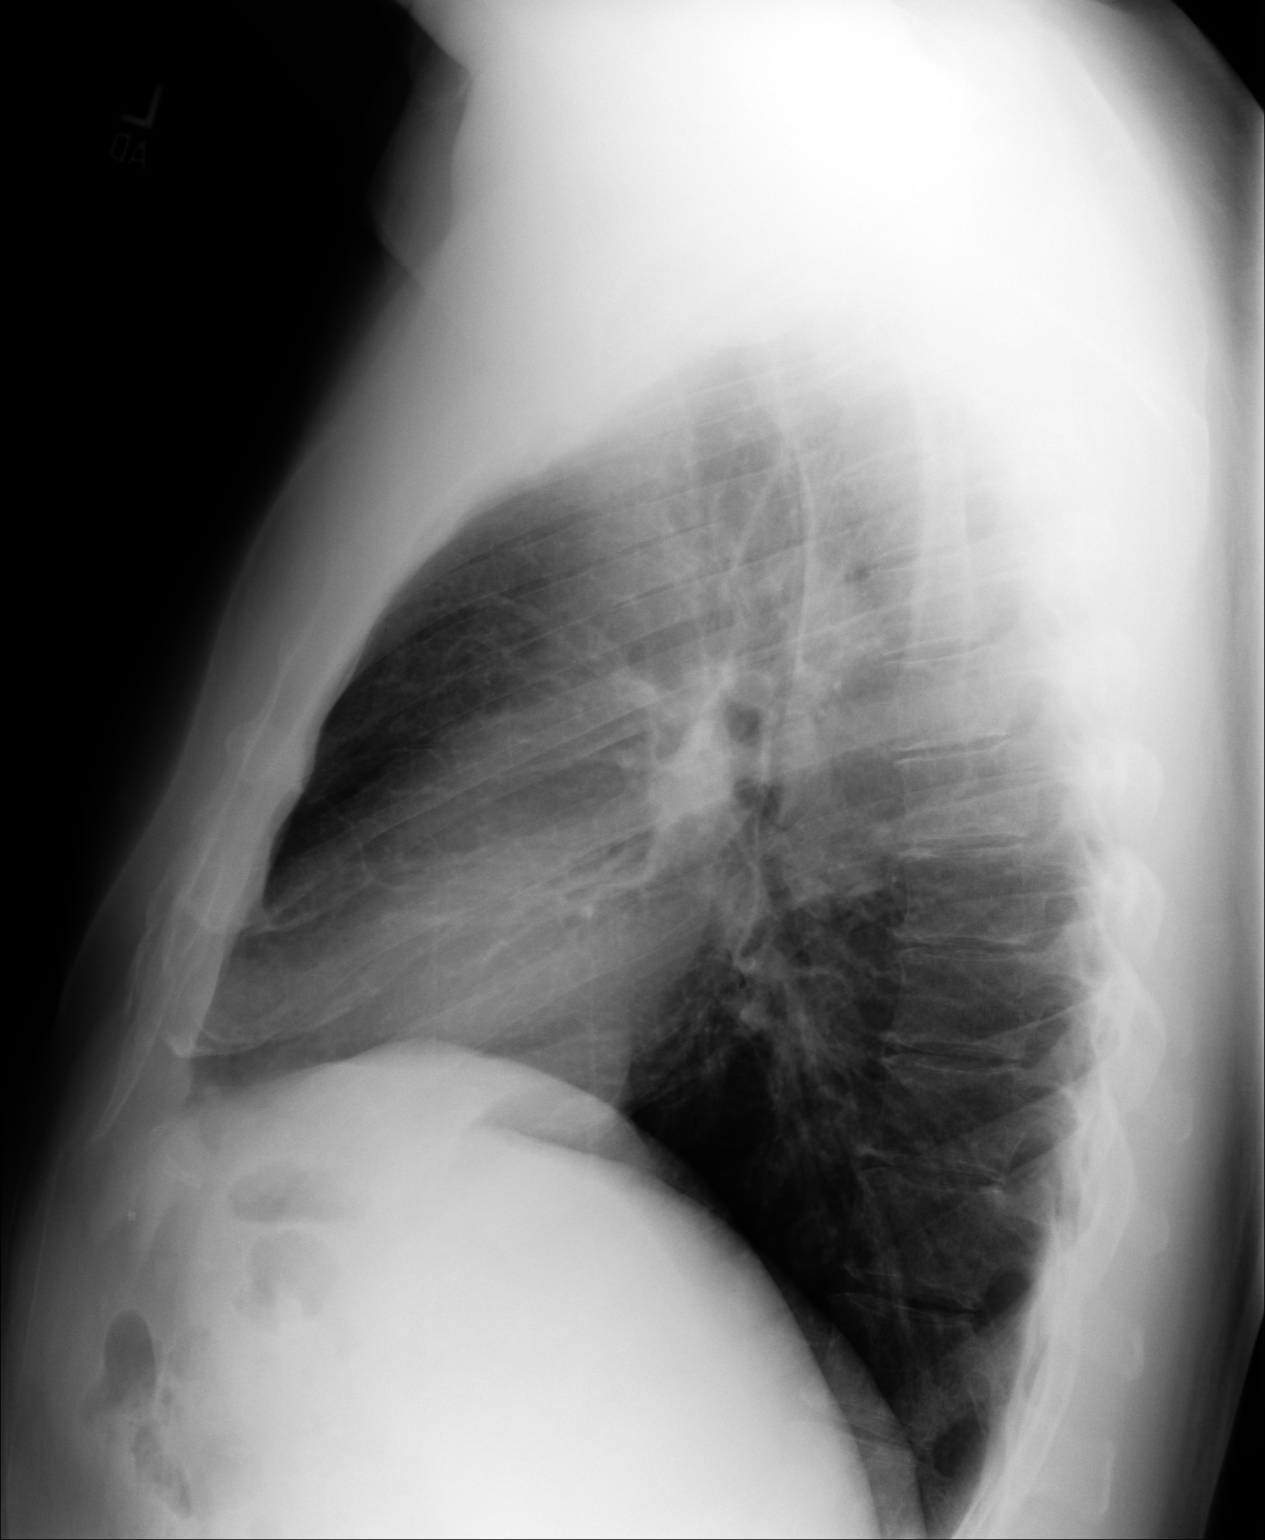

[2 of 2 positions shown; findings below may reference images not displayed]

IMPRESSION: 1. Chest radiograph without evidence of acute cardiopulmonary disease.

## 2010-10-14 ENCOUNTER — Ambulatory Visit: Payer: 59

## 2010-10-21 ENCOUNTER — Ambulatory Visit: Payer: 59

## 2010-10-26 ENCOUNTER — Ambulatory Visit (INDEPENDENT_AMBULATORY_CARE_PROVIDER_SITE_OTHER): Payer: 59 | Admitting: Family Medicine

## 2010-10-26 ENCOUNTER — Other Ambulatory Visit: Payer: Self-pay | Admitting: *Deleted

## 2010-10-26 ENCOUNTER — Encounter: Payer: Self-pay | Admitting: Family Medicine

## 2010-10-26 VITALS — BP 126/80 | HR 76 | Temp 98.3°F | Ht 71.0 in | Wt 245.1 lb

## 2010-10-26 DIAGNOSIS — Z7901 Long term (current) use of anticoagulants: Secondary | ICD-10-CM

## 2010-10-26 DIAGNOSIS — R21 Rash and other nonspecific skin eruption: Secondary | ICD-10-CM

## 2010-10-26 DIAGNOSIS — I2699 Other pulmonary embolism without acute cor pulmonale: Secondary | ICD-10-CM

## 2010-10-26 DIAGNOSIS — Z5181 Encounter for therapeutic drug level monitoring: Secondary | ICD-10-CM

## 2010-10-26 LAB — POCT INR: INR: 4.2

## 2010-10-26 MED ORDER — VARDENAFIL HCL 10 MG PO TABS: 10.0000 mg | ORAL_TABLET | ORAL | Status: DC

## 2010-10-26 MED ORDER — PREDNISONE 20 MG PO TABS: ORAL_TABLET | ORAL | Status: DC

## 2010-10-26 MED ORDER — LISDEXAMFETAMINE DIMESYLATE 50 MG PO CAPS: 50.0000 mg | ORAL_CAPSULE | ORAL | Status: DC

## 2010-10-26 MED ORDER — DEXAMETHASONE SODIUM PHOSPHATE 10 MG/ML IJ SOLN
10.0000 mg | Freq: Once | INTRAMUSCULAR | Status: AC
  Administered 2010-10-26: 10 mg via INTRAMUSCULAR

## 2010-10-26 MED ORDER — WARFARIN SODIUM 3 MG PO TABS: ORAL_TABLET | ORAL | Status: DC

## 2010-10-26 NOTE — Patient Instructions (Signed)
Hold dose today, then 3 mg daily. Recheck in 2 weeks, out of town until then.

## 2010-10-26 NOTE — Patient Instructions (Signed)
Looks like poison ivy/oak exposure. Steroid shot today. Start steroid taper tonight or tomorrow (weeklong). Continue benadryl and calomine. Call us with questions.  Good to see you today.  Poison Ivy (Toxicodendron Dermatitis) Poison ivy is a inflammation of the skin (contact dermatitis) caused by touching the allergens on the leaves of the ivy plant following previous exposure to the plant. The rash usually appears 48 hours after exposure. The rash is usually bumps (papules) or blisters (vesicles) in a linear pattern. Depending on your own sensitivity, the rash may simply cause redness and itching, or it may also progress to blisters which may break open. These must be well cared for to prevent secondary bacterial (germ) infection, followed by scarring. Keep any open areas dry, clean, dressed, and covered with an antibacterial ointment if needed. The eyes may also get puffy. The puffiness is worst in the morning and gets better as the day progresses. This dermatitis usually heals without scarring, within 2 to 3 weeks without treatment. HOME CARE INSTRUCTIONS Thoroughly wash with soap and water as soon as you have been exposed to poison ivy. You have about one half hour to remove the plant resin before it will cause the rash. This washing will destroy the oil or antigen on the skin that is causing, or will cause, the rash. Be sure to wash under your fingernails as any plant resin there will continue to spread the rash. Do not rub skin vigorously when washing affected area. Poison ivy cannot spread if no oil from the plant remains on your body. A rash that has progressed to weeping sores will not spread the rash unless you have not washed thoroughly. It is also important to wash any clothes you have been wearing as these may carry active allergens. The rash will return if you wear the unwashed clothing, even several days later. Avoidance of the plant in the future is the best measure. Poison ivy plant can  be recognized by the number of leaves. Generally, poison ivy has three leaves with flowering branches on a single stem. Diphenhydramine may be purchased over the counter and used as needed for itching. Do not drive with this medication if it makes you drowsy. Ask your caregiver about medication for children. SEEK MEDICAL CARE IF   Open sores develop.   Redness spreads beyond area of rash.   You notice purulent (pus-like) discharge.   You have increased pain.   Other signs of infection develop (such as fever).  Document Released: 06/16/2000 Document Re-Released: 06/07/2009 [facility] Patient Information 2011 [place], [place].

## 2010-10-26 NOTE — Telephone Encounter (Signed)
Patient advised.  Rx. Left at front desk for pick up. 

## 2010-10-26 NOTE — Telephone Encounter (Signed)
Please have pt get OV in the next 2-3 months with me.  Thanks.

## 2010-10-26 NOTE — Progress Notes (Signed)
  Subjective:    Patient ID: Ronald Hatfield, male    DOB: 28-Dec-1965, 45 y.o.   MRN: [id number]  HPI CC: poison oak?  Working in yard this weekend.  Then noticed rash on arms bilaterally, today woke up noticing rash on face and also noticing some swelling on face.    Using calomine lotion, bendaryl, some OTC foam.  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  Never had significant reaction to poison oak like currently.  No new meds recently.  No new lotions, detergents, shampoos or soaps.  No smoke exposure.  No trouble breathing, n/v, abd pain, throat swelling.  Review of Systems Per HPI    Objective:   Physical Exam  Constitutional: He appears well-developed and well-nourished. No distress.  HENT:  Head: Normocephalic and atraumatic.  Mouth/Throat: Oropharynx is clear and moist. No oropharyngeal exudate.  Cardiovascular: Normal rate, regular rhythm, normal heart sounds and intact distal pulses.   No murmur heard. Pulmonary/Chest: Effort normal and breath sounds normal. No respiratory distress. He has no wheezes. He has no rales.  Skin: Skin is warm. Rash noted.       Linear papular rash forearms and legs, pruritic. + linear papular rash right neck as well + limited papules nasal bridge bilaterally, mild periorbital swelling and flushed face          Assessment & Plan:

## 2010-10-26 NOTE — Assessment & Plan Note (Signed)
Anticipate poison ivy. Spread to face/neck. Given swelling, treat with steroid shot and taper. Update [place] if not improving as expected, no red flags today.

## 2010-11-11 ENCOUNTER — Ambulatory Visit: Payer: 59

## 2010-12-02 ENCOUNTER — Ambulatory Visit (INDEPENDENT_AMBULATORY_CARE_PROVIDER_SITE_OTHER): Payer: 59 | Admitting: Family Medicine

## 2010-12-02 ENCOUNTER — Ambulatory Visit: Payer: 59

## 2010-12-02 ENCOUNTER — Other Ambulatory Visit: Payer: 59

## 2010-12-02 DIAGNOSIS — I2699 Other pulmonary embolism without acute cor pulmonale: Secondary | ICD-10-CM

## 2010-12-02 DIAGNOSIS — Z5181 Encounter for therapeutic drug level monitoring: Secondary | ICD-10-CM

## 2010-12-02 DIAGNOSIS — Z7901 Long term (current) use of anticoagulants: Secondary | ICD-10-CM

## 2010-12-02 LAB — POCT INR: INR: 2.8

## 2010-12-02 NOTE — Patient Instructions (Signed)
Continue current dose and return in 4 weeks.  

## 2011-01-10 ENCOUNTER — Ambulatory Visit (INDEPENDENT_AMBULATORY_CARE_PROVIDER_SITE_OTHER): Payer: 59 | Admitting: Family Medicine

## 2011-01-10 DIAGNOSIS — I2699 Other pulmonary embolism without acute cor pulmonale: Secondary | ICD-10-CM

## 2011-01-10 DIAGNOSIS — Z7901 Long term (current) use of anticoagulants: Secondary | ICD-10-CM

## 2011-01-10 DIAGNOSIS — Z5181 Encounter for therapeutic drug level monitoring: Secondary | ICD-10-CM

## 2011-01-10 NOTE — Patient Instructions (Signed)
Hold x 1 day then resume 3 mg daily. Patient states he did not eat well due to heat  last week. Refused dose change. Recheck in 1 week. Patients diet is not stable. Talked to patient about being more consistent with vegetable intake.

## 2011-01-20 ENCOUNTER — Other Ambulatory Visit: Payer: Self-pay | Admitting: *Deleted

## 2011-01-20 ENCOUNTER — Ambulatory Visit (INDEPENDENT_AMBULATORY_CARE_PROVIDER_SITE_OTHER): Payer: 59 | Admitting: Family Medicine

## 2011-01-20 DIAGNOSIS — I2699 Other pulmonary embolism without acute cor pulmonale: Secondary | ICD-10-CM

## 2011-01-20 DIAGNOSIS — Z7901 Long term (current) use of anticoagulants: Secondary | ICD-10-CM

## 2011-01-20 DIAGNOSIS — Z5181 Encounter for therapeutic drug level monitoring: Secondary | ICD-10-CM

## 2011-01-20 LAB — POCT INR: INR: 3.2

## 2011-01-20 NOTE — Patient Instructions (Signed)
Take 3 mg daily and recheck in 4 weeks .

## 2011-01-22 NOTE — Telephone Encounter (Signed)
Please give to pt.  He is due for CPE.

## 2011-01-23 MED ORDER — VARDENAFIL HCL 10 MG PO TABS: 10.0000 mg | ORAL_TABLET | ORAL | Status: DC

## 2011-01-23 MED ORDER — LISDEXAMFETAMINE DIMESYLATE 50 MG PO CAPS: 50.0000 mg | ORAL_CAPSULE | ORAL | Status: DC

## 2011-01-23 NOTE — Telephone Encounter (Signed)
Patient notified as instructed by telephone. Patient will schedule an appointment for a physical when he comes in to pick the prescriptions up.

## 2011-03-13 ENCOUNTER — Other Ambulatory Visit: Payer: Self-pay | Admitting: *Deleted

## 2011-03-13 NOTE — Telephone Encounter (Signed)
Please give to pt.  

## 2011-03-13 NOTE — Telephone Encounter (Signed)
Pt is asking for written scripts for all of these meds.  Please call when ready for pick up.

## 2011-03-14 ENCOUNTER — Ambulatory Visit (INDEPENDENT_AMBULATORY_CARE_PROVIDER_SITE_OTHER): Payer: 59 | Admitting: Family Medicine

## 2011-03-14 DIAGNOSIS — I2699 Other pulmonary embolism without acute cor pulmonale: Secondary | ICD-10-CM

## 2011-03-14 DIAGNOSIS — Z5181 Encounter for therapeutic drug level monitoring: Secondary | ICD-10-CM

## 2011-03-14 DIAGNOSIS — Z7901 Long term (current) use of anticoagulants: Secondary | ICD-10-CM

## 2011-03-14 LAB — POCT INR: INR: 3.2

## 2011-03-14 MED ORDER — VARDENAFIL HCL 10 MG PO TABS: 10.0000 mg | ORAL_TABLET | ORAL | Status: DC

## 2011-03-14 MED ORDER — WARFARIN SODIUM 3 MG PO TABS: ORAL_TABLET | ORAL | Status: DC

## 2011-03-14 MED ORDER — LISDEXAMFETAMINE DIMESYLATE 50 MG PO CAPS: 50.0000 mg | ORAL_CAPSULE | ORAL | Status: DC

## 2011-03-14 NOTE — Telephone Encounter (Signed)
Patient advised.  Rx. Left at front desk for pick up. 

## 2011-03-14 NOTE — Patient Instructions (Signed)
Continue  3 mg daily , recheck 4 weeks

## 2011-03-14 NOTE — Telephone Encounter (Deleted)
Please give to pt.  

## 2011-04-19 ENCOUNTER — Other Ambulatory Visit: Payer: 59

## 2011-04-19 ENCOUNTER — Other Ambulatory Visit (INDEPENDENT_AMBULATORY_CARE_PROVIDER_SITE_OTHER): Payer: 59

## 2011-04-19 ENCOUNTER — Other Ambulatory Visit: Payer: Self-pay | Admitting: Family Medicine

## 2011-04-19 DIAGNOSIS — E7800 Pure hypercholesterolemia, unspecified: Secondary | ICD-10-CM

## 2011-04-19 DIAGNOSIS — Z125 Encounter for screening for malignant neoplasm of prostate: Secondary | ICD-10-CM

## 2011-04-19 DIAGNOSIS — R7309 Other abnormal glucose: Secondary | ICD-10-CM

## 2011-04-19 DIAGNOSIS — R739 Hyperglycemia, unspecified: Secondary | ICD-10-CM

## 2011-04-19 LAB — COMPREHENSIVE METABOLIC PANEL
BUN: 14 mg/dL (ref 6–23)
CO2: 29 mEq/L (ref 19–32)
Calcium: 8.8 mg/dL (ref 8.4–10.5)
Chloride: 103 mEq/L (ref 96–112)
Creatinine, Ser: 1.2 mg/dL (ref 0.4–1.5)
GFR: 68.8 mL/min (ref >60.00)
Glucose, Bld: 116 mg/dL — ABNORMAL HIGH (ref 70–99)
Total Bilirubin: 1.1 mg/dL (ref 0.3–1.2)

## 2011-04-19 LAB — CBC WITH DIFFERENTIAL/PLATELET
Basophils Relative: 0.6 % (ref 0.0–3.0)
Eosinophils Absolute: 0.2 10*3/uL (ref 0.0–0.7)
Eosinophils Relative: 4.8 % (ref 0.0–5.0)
Lymphocytes Relative: 28.3 % (ref 12.0–46.0)
MCV: 88.5 fl (ref 78.0–100.0)
Monocytes Absolute: 0.4 10*3/uL (ref 0.1–1.0)
Neutrophils Relative %: 58.8 % (ref 43.0–77.0)
Platelets: 243 10*3/uL (ref 150.0–400.0)
RBC: 5.53 Mil/uL (ref 4.22–5.81)
WBC: 5.2 10*3/uL (ref 4.5–10.5)

## 2011-04-19 LAB — LIPID PANEL
Cholesterol: 226 mg/dL — ABNORMAL HIGH (ref 0–200)
Triglycerides: 156 mg/dL — ABNORMAL HIGH (ref 0.0–149.0)

## 2011-04-19 LAB — LDL CHOLESTEROL, DIRECT: Direct LDL: 150.1 mg/dL

## 2011-04-19 LAB — HEMOGLOBIN A1C: Hgb A1c MFr Bld: 5.2 % (ref 4.6–6.5)

## 2011-04-19 LAB — TSH: TSH: 0.9 u[IU]/mL (ref 0.35–5.50)

## 2011-04-21 ENCOUNTER — Ambulatory Visit (INDEPENDENT_AMBULATORY_CARE_PROVIDER_SITE_OTHER): Payer: 59 | Admitting: Internal Medicine

## 2011-04-21 ENCOUNTER — Encounter: Payer: Self-pay | Admitting: Family Medicine

## 2011-04-21 ENCOUNTER — Ambulatory Visit (INDEPENDENT_AMBULATORY_CARE_PROVIDER_SITE_OTHER): Payer: 59 | Admitting: Family Medicine

## 2011-04-21 VITALS — BP 122/84 | HR 81 | Temp 98.1°F | Wt 242.1 lb

## 2011-04-21 DIAGNOSIS — R7309 Other abnormal glucose: Secondary | ICD-10-CM

## 2011-04-21 DIAGNOSIS — F988 Other specified behavioral and emotional disorders with onset usually occurring in childhood and adolescence: Secondary | ICD-10-CM

## 2011-04-21 DIAGNOSIS — Z5181 Encounter for therapeutic drug level monitoring: Secondary | ICD-10-CM

## 2011-04-21 DIAGNOSIS — E7800 Pure hypercholesterolemia, unspecified: Secondary | ICD-10-CM

## 2011-04-21 DIAGNOSIS — Z86711 Personal history of pulmonary embolism: Secondary | ICD-10-CM

## 2011-04-21 DIAGNOSIS — Z7901 Long term (current) use of anticoagulants: Secondary | ICD-10-CM

## 2011-04-21 DIAGNOSIS — N529 Male erectile dysfunction, unspecified: Secondary | ICD-10-CM

## 2011-04-21 DIAGNOSIS — B009 Herpesviral infection, unspecified: Secondary | ICD-10-CM

## 2011-04-21 DIAGNOSIS — Z23 Encounter for immunization: Secondary | ICD-10-CM

## 2011-04-21 DIAGNOSIS — I4891 Unspecified atrial fibrillation: Secondary | ICD-10-CM

## 2011-04-21 DIAGNOSIS — B001 Herpesviral vesicular dermatitis: Secondary | ICD-10-CM

## 2011-04-21 DIAGNOSIS — Z0000 Encounter for general adult medical examination without abnormal findings: Secondary | ICD-10-CM

## 2011-04-21 LAB — POCT INR: INR: 2.4

## 2011-04-21 MED ORDER — VALACYCLOVIR HCL 1 G PO TABS: 2000.0000 mg | ORAL_TABLET | Freq: Two times a day (BID) | ORAL | Status: AC

## 2011-04-21 MED ORDER — LISDEXAMFETAMINE DIMESYLATE 40 MG PO CAPS: 40.0000 mg | ORAL_CAPSULE | ORAL | Status: DC

## 2011-04-21 MED ORDER — WARFARIN SODIUM 3 MG PO TABS: ORAL_TABLET | ORAL | Status: DC

## 2011-04-21 MED ORDER — VARDENAFIL HCL 10 MG PO TABS: 10.0000 mg | ORAL_TABLET | ORAL | Status: DC

## 2011-04-21 NOTE — Progress Notes (Signed)
CPE- See plan.  Routine anticipatory guidance given to patient.  See health maintenance.  H/o PE and anticoagulated.  No blood in urine, stool (but some bleed from hemorrhoid).  Occ bruising, minimal.  Occ nosebleeds, mild. Compliant with meds.    ADD Compliant with meds: yes, taken M-F benefit from med (ie increase in concentration): yes change in mood: no change in appetite:yes, decreased with med Insomnia: occ Tremor: occ, mild compliant with behavioral modification: yes  ED: good effect from med, occ HA and lights seem brighter when on levitra; sx mild and self resolve.  Not SOB, BLE edema.    PMH and SH reviewed  Meds, vitals, and allergies reviewed.   ROS: See HPI.  Otherwise negative.    GEN: nad, alert and oriented, affect wnl and appropriate HEENT: mucous membranes moist, no nosebleed/clot NECK: supple w/o LA CV: rrr.  PULM: ctab, no inc wob ABD: soft, +bs EXT: no edema CN 2-12 wnl, s/s/dtr wnl x4.  No tremor.

## 2011-04-21 NOTE — Patient Instructions (Signed)
Continue  3 mg daily , recheck 4 weeks 

## 2011-04-21 NOTE — Patient Instructions (Signed)
Work on Nurse, learning disability and exercise more.  Goal for 1 lbs weight loss per month.   I would recheck labs at a physical next year.  Let me know if the lower dose of vyvanse doesn't help.   Knee to chest stretching may help your back.   Glad to see you.

## 2011-04-23 ENCOUNTER — Encounter: Payer: Self-pay | Admitting: Family Medicine

## 2011-04-23 DIAGNOSIS — Z0000 Encounter for general adult medical examination without abnormal findings: Secondary | ICD-10-CM | POA: Insufficient documentation

## 2011-04-23 DIAGNOSIS — B001 Herpesviral vesicular dermatitis: Secondary | ICD-10-CM | POA: Insufficient documentation

## 2011-04-23 DIAGNOSIS — Z86711 Personal history of pulmonary embolism: Secondary | ICD-10-CM | POA: Insufficient documentation

## 2011-04-23 NOTE — Assessment & Plan Note (Signed)
Decrease total dose and he'll call back if not effective. He agrees.  D/w pt about behavioral mods to help with med effect.

## 2011-04-23 NOTE — Assessment & Plan Note (Signed)
Continue anticoag.

## 2011-04-23 NOTE — Assessment & Plan Note (Signed)
rx for valtrex done for prn use per patient request.

## 2011-04-23 NOTE — Assessment & Plan Note (Signed)
Flu shot today.  Tetanus not due yet. No indication for early prostate/colon CA eval.  Diet/exercise discussed with healthy habits.

## 2011-04-23 NOTE — Assessment & Plan Note (Signed)
Labs d/w pt, esp re: diet and exercise along with weight.  He understood.  Recheck yearly.

## 2011-04-23 NOTE — Assessment & Plan Note (Signed)
Continue current meds.  Tolerated w/o sig side effects.

## 2011-04-23 NOTE — Assessment & Plan Note (Signed)
Continue work on Occupational hygienist.  D/w pt.

## 2011-05-19 ENCOUNTER — Ambulatory Visit: Payer: 59

## 2011-06-02 ENCOUNTER — Ambulatory Visit: Payer: 59

## 2011-06-05 ENCOUNTER — Ambulatory Visit: Payer: 59

## 2011-06-07 ENCOUNTER — Other Ambulatory Visit: Payer: Self-pay | Admitting: Internal Medicine

## 2011-06-07 ENCOUNTER — Ambulatory Visit (INDEPENDENT_AMBULATORY_CARE_PROVIDER_SITE_OTHER): Payer: 59 | Admitting: Family Medicine

## 2011-06-07 DIAGNOSIS — I2699 Other pulmonary embolism without acute cor pulmonale: Secondary | ICD-10-CM

## 2011-06-07 DIAGNOSIS — Z7901 Long term (current) use of anticoagulants: Secondary | ICD-10-CM

## 2011-06-07 DIAGNOSIS — Z5181 Encounter for therapeutic drug level monitoring: Secondary | ICD-10-CM

## 2011-06-07 MED ORDER — LISDEXAMFETAMINE DIMESYLATE 40 MG PO CAPS: 40.0000 mg | ORAL_CAPSULE | ORAL | Status: DC

## 2011-06-07 NOTE — Telephone Encounter (Signed)
Request for refill on Vyvanse. 

## 2011-06-07 NOTE — Patient Instructions (Signed)
Continue current dose, check in 4 weeks  

## 2011-06-07 NOTE — Telephone Encounter (Signed)
Patient notified by telephone that rx is up front and ready for pickup.

## 2011-07-14 ENCOUNTER — Ambulatory Visit (INDEPENDENT_AMBULATORY_CARE_PROVIDER_SITE_OTHER): Payer: 59 | Admitting: Family Medicine

## 2011-07-14 DIAGNOSIS — Z5181 Encounter for therapeutic drug level monitoring: Secondary | ICD-10-CM

## 2011-07-14 DIAGNOSIS — Z7901 Long term (current) use of anticoagulants: Secondary | ICD-10-CM

## 2011-07-14 DIAGNOSIS — I2699 Other pulmonary embolism without acute cor pulmonale: Secondary | ICD-10-CM

## 2011-07-14 NOTE — Patient Instructions (Signed)
Continue  3 mg daily , Take 6 mg today only then resume 3 mg daily recheck 2 weeks

## 2011-07-28 ENCOUNTER — Ambulatory Visit (INDEPENDENT_AMBULATORY_CARE_PROVIDER_SITE_OTHER): Payer: 59 | Admitting: Family Medicine

## 2011-07-28 DIAGNOSIS — I2699 Other pulmonary embolism without acute cor pulmonale: Secondary | ICD-10-CM

## 2011-07-28 DIAGNOSIS — Z5181 Encounter for therapeutic drug level monitoring: Secondary | ICD-10-CM

## 2011-07-28 DIAGNOSIS — Z7901 Long term (current) use of anticoagulants: Secondary | ICD-10-CM

## 2011-07-28 NOTE — Patient Instructions (Signed)
Continue current dose, check in 4 weeks  

## 2011-08-25 ENCOUNTER — Ambulatory Visit: Payer: 59

## 2011-09-08 ENCOUNTER — Telehealth: Payer: Self-pay | Admitting: Family Medicine

## 2011-09-08 ENCOUNTER — Ambulatory Visit (INDEPENDENT_AMBULATORY_CARE_PROVIDER_SITE_OTHER): Payer: 59 | Admitting: Family Medicine

## 2011-09-08 DIAGNOSIS — Z5181 Encounter for therapeutic drug level monitoring: Secondary | ICD-10-CM

## 2011-09-08 DIAGNOSIS — Z7901 Long term (current) use of anticoagulants: Secondary | ICD-10-CM

## 2011-09-08 DIAGNOSIS — I2699 Other pulmonary embolism without acute cor pulmonale: Secondary | ICD-10-CM

## 2011-09-08 MED ORDER — LISDEXAMFETAMINE DIMESYLATE 40 MG PO CAPS: 40.0000 mg | ORAL_CAPSULE | ORAL | Status: DC

## 2011-09-08 NOTE — Telephone Encounter (Signed)
Please give to pt.  Thanks.   

## 2011-09-08 NOTE — Telephone Encounter (Signed)
Pt request meds while in for Coumdin check .Marland KitchenMarland Kitchen

## 2011-09-08 NOTE — Patient Instructions (Signed)
Continue current dose, check in 4 weeks  

## 2011-09-12 NOTE — Telephone Encounter (Signed)
Rx given to patient..Ronald Hatfield

## 2011-10-13 ENCOUNTER — Ambulatory Visit: Payer: 59

## 2011-10-20 ENCOUNTER — Ambulatory Visit: Payer: 59

## 2011-10-23 ENCOUNTER — Ambulatory Visit (INDEPENDENT_AMBULATORY_CARE_PROVIDER_SITE_OTHER): Payer: 59 | Admitting: Family Medicine

## 2011-10-23 ENCOUNTER — Other Ambulatory Visit: Payer: Self-pay

## 2011-10-23 ENCOUNTER — Telehealth: Payer: Self-pay | Admitting: Family Medicine

## 2011-10-23 DIAGNOSIS — Z7901 Long term (current) use of anticoagulants: Secondary | ICD-10-CM

## 2011-10-23 DIAGNOSIS — Z5181 Encounter for therapeutic drug level monitoring: Secondary | ICD-10-CM

## 2011-10-23 DIAGNOSIS — I4891 Unspecified atrial fibrillation: Secondary | ICD-10-CM

## 2011-10-23 MED ORDER — LISDEXAMFETAMINE DIMESYLATE 40 MG PO CAPS: 40.0000 mg | ORAL_CAPSULE | ORAL | Status: DC

## 2011-10-23 NOTE — Telephone Encounter (Signed)
The patient, Ronald Hatfield, Ronald Hatfield Male, 46 y.o., 09/28/1965, called with complaints today about billing.  The patient said that he was charged at Coumadin visit for co pay and now receiving bills for an additional amount for office visit and is questioning the bill because he said he did not see the doctor and only spoke to our lab person.  I explained providers review and advise as well.   The patient said that this has been going on for a year and that he doesn't feel he should have to pay for any of the office visit charges.  I do not see any previous notes on the patient questioning the bill; however, explained to the patient that we'd be glad to take a look at it.  Sent to coding/billing to review documentation and advise.     The patient may be called at [phone] if you have questions or would like to speak with him.

## 2011-10-23 NOTE — Telephone Encounter (Signed)
Please give to patient after I sign.  Printed.

## 2011-10-23 NOTE — Telephone Encounter (Signed)
Pt walked in and request written Vyvanse rx. Pt can be reached at [phone] when rx ready for pick up. Pt last seen 04/21/11.

## 2011-10-23 NOTE — Patient Instructions (Signed)
Continue current dose, check in 4 weeks  

## 2011-10-24 NOTE — Telephone Encounter (Signed)
Patient advised.  Rx. Left at front desk for pick up. 

## 2011-10-31 NOTE — Telephone Encounter (Signed)
Called patient to inform that we were researching his questions and I am sending to [facility] for follow up.  This patient is upset because he has talked to 4 different people.  I will request Kennyth Arnold call me back or email me with info prior to contacting patient due to nature of his questions/concerns.

## 2011-10-31 NOTE — Telephone Encounter (Signed)
Geri reviewed billing/coding and sent message back that she also saw that he has a balance on his account of $54.37, which most of it dates back to April 19, 2011; I had some questions in how some funds were posted to his account.  I called [employer] and they are putting his account on hold until they research his account and make sure they have billed him correctly.

## 2011-11-24 ENCOUNTER — Ambulatory Visit: Payer: 59

## 2011-12-01 ENCOUNTER — Ambulatory Visit (INDEPENDENT_AMBULATORY_CARE_PROVIDER_SITE_OTHER): Payer: 59 | Admitting: Family Medicine

## 2011-12-01 ENCOUNTER — Institutional Professional Consult (permissible substitution) (INDEPENDENT_AMBULATORY_CARE_PROVIDER_SITE_OTHER): Payer: 59

## 2011-12-01 DIAGNOSIS — Z7901 Long term (current) use of anticoagulants: Secondary | ICD-10-CM

## 2011-12-01 DIAGNOSIS — Z5181 Encounter for therapeutic drug level monitoring: Secondary | ICD-10-CM

## 2011-12-01 DIAGNOSIS — Z86711 Personal history of pulmonary embolism: Secondary | ICD-10-CM

## 2011-12-01 LAB — POCT INR: INR: 4

## 2011-12-01 NOTE — Patient Instructions (Signed)
.  hold today, then 3 mg daily, 1 week

## 2011-12-08 ENCOUNTER — Ambulatory Visit (INDEPENDENT_AMBULATORY_CARE_PROVIDER_SITE_OTHER): Payer: 59 | Admitting: Internal Medicine

## 2011-12-08 DIAGNOSIS — Z5181 Encounter for therapeutic drug level monitoring: Secondary | ICD-10-CM

## 2011-12-08 DIAGNOSIS — Z7901 Long term (current) use of anticoagulants: Secondary | ICD-10-CM

## 2011-12-08 DIAGNOSIS — Z86711 Personal history of pulmonary embolism: Secondary | ICD-10-CM

## 2011-12-08 LAB — POCT INR: INR: 1.7

## 2011-12-08 NOTE — Patient Instructions (Signed)
Continue current dose, check in 2 weeks  

## 2011-12-22 ENCOUNTER — Ambulatory Visit (INDEPENDENT_AMBULATORY_CARE_PROVIDER_SITE_OTHER): Payer: 59 | Admitting: Family Medicine

## 2011-12-22 DIAGNOSIS — Z86711 Personal history of pulmonary embolism: Secondary | ICD-10-CM

## 2011-12-22 DIAGNOSIS — Z5181 Encounter for therapeutic drug level monitoring: Secondary | ICD-10-CM

## 2011-12-22 DIAGNOSIS — Z7901 Long term (current) use of anticoagulants: Secondary | ICD-10-CM

## 2011-12-22 NOTE — Patient Instructions (Signed)
Continue current dose, check in 4 weeks  

## 2012-01-15 ENCOUNTER — Other Ambulatory Visit: Payer: Self-pay

## 2012-01-15 NOTE — Telephone Encounter (Signed)
Pt request rx Vyvanse. Call when ready for pick up. 

## 2012-01-16 MED ORDER — LISDEXAMFETAMINE DIMESYLATE 40 MG PO CAPS: 40.0000 mg | ORAL_CAPSULE | ORAL | Status: DC

## 2012-01-16 NOTE — Telephone Encounter (Signed)
Please give to patient after I sign. Will need CPE this fall. Thanks.

## 2012-01-16 NOTE — Telephone Encounter (Signed)
Patient advised.  Rx left at front desk for pick up. 

## 2012-01-19 ENCOUNTER — Ambulatory Visit (INDEPENDENT_AMBULATORY_CARE_PROVIDER_SITE_OTHER): Payer: 59 | Admitting: Family Medicine

## 2012-01-19 DIAGNOSIS — Z7901 Long term (current) use of anticoagulants: Secondary | ICD-10-CM

## 2012-01-19 DIAGNOSIS — Z86711 Personal history of pulmonary embolism: Secondary | ICD-10-CM

## 2012-01-19 DIAGNOSIS — Z5181 Encounter for therapeutic drug level monitoring: Secondary | ICD-10-CM

## 2012-01-19 NOTE — Patient Instructions (Addendum)
3 mg daily; Return in 2 weeks per Dr. Para March

## 2012-02-02 ENCOUNTER — Ambulatory Visit (INDEPENDENT_AMBULATORY_CARE_PROVIDER_SITE_OTHER): Payer: 59 | Admitting: Family Medicine

## 2012-02-02 DIAGNOSIS — Z86711 Personal history of pulmonary embolism: Secondary | ICD-10-CM

## 2012-02-02 DIAGNOSIS — Z5181 Encounter for therapeutic drug level monitoring: Secondary | ICD-10-CM

## 2012-02-02 DIAGNOSIS — Z7901 Long term (current) use of anticoagulants: Secondary | ICD-10-CM

## 2012-02-02 NOTE — Patient Instructions (Signed)
Continue current dose, check in 4 weeks  

## 2012-03-01 ENCOUNTER — Other Ambulatory Visit: Payer: Self-pay | Admitting: Family Medicine

## 2012-03-01 ENCOUNTER — Ambulatory Visit (INDEPENDENT_AMBULATORY_CARE_PROVIDER_SITE_OTHER): Payer: 59 | Admitting: Family Medicine

## 2012-03-01 ENCOUNTER — Ambulatory Visit: Payer: 59

## 2012-03-01 DIAGNOSIS — Z7901 Long term (current) use of anticoagulants: Secondary | ICD-10-CM

## 2012-03-01 DIAGNOSIS — Z5181 Encounter for therapeutic drug level monitoring: Secondary | ICD-10-CM

## 2012-03-01 DIAGNOSIS — Z86711 Personal history of pulmonary embolism: Secondary | ICD-10-CM

## 2012-03-01 NOTE — Patient Instructions (Signed)
Continue current dose, check in 4 weeks  

## 2012-03-01 NOTE — Telephone Encounter (Signed)
Pt stated he needed a refill of his VYVANSE. Please Advise.

## 2012-03-04 MED ORDER — LISDEXAMFETAMINE DIMESYLATE 40 MG PO CAPS: 40.0000 mg | ORAL_CAPSULE | ORAL | Status: DC

## 2012-03-04 NOTE — Telephone Encounter (Signed)
Please give to pt after I sign. Thanks.  

## 2012-03-05 NOTE — Telephone Encounter (Signed)
Patient advised that written script is up front and ready for pickup.

## 2012-03-29 ENCOUNTER — Ambulatory Visit: Payer: 59

## 2012-04-14 ENCOUNTER — Other Ambulatory Visit: Payer: Self-pay | Admitting: Family Medicine

## 2012-04-14 DIAGNOSIS — E7800 Pure hypercholesterolemia, unspecified: Secondary | ICD-10-CM

## 2012-04-14 DIAGNOSIS — Z86711 Personal history of pulmonary embolism: Secondary | ICD-10-CM

## 2012-04-17 ENCOUNTER — Other Ambulatory Visit: Payer: Self-pay | Admitting: *Deleted

## 2012-04-17 ENCOUNTER — Other Ambulatory Visit (INDEPENDENT_AMBULATORY_CARE_PROVIDER_SITE_OTHER): Payer: 59

## 2012-04-17 DIAGNOSIS — Z86711 Personal history of pulmonary embolism: Secondary | ICD-10-CM

## 2012-04-17 DIAGNOSIS — E7800 Pure hypercholesterolemia, unspecified: Secondary | ICD-10-CM

## 2012-04-17 LAB — LIPID PANEL
HDL: 46.1 mg/dL (ref >39.00)
Triglycerides: 148 mg/dL (ref 0.0–149.0)

## 2012-04-17 LAB — CBC WITH DIFFERENTIAL/PLATELET
Basophils Relative: 0.9 % (ref 0.0–3.0)
Eosinophils Relative: 4.1 % (ref 0.0–5.0)
HCT: 50.4 % (ref 39.0–52.0)
Lymphs Abs: 1.5 10*3/uL (ref 0.7–4.0)
MCV: 90.3 fl (ref 78.0–100.0)
Monocytes Absolute: 0.5 10*3/uL (ref 0.1–1.0)
RBC: 5.58 Mil/uL (ref 4.22–5.81)
WBC: 5.4 10*3/uL (ref 4.5–10.5)

## 2012-04-17 LAB — COMPREHENSIVE METABOLIC PANEL
Alkaline Phosphatase: 46 U/L (ref 39–117)
BUN: 11 mg/dL (ref 6–23)
Glucose, Bld: 88 mg/dL (ref 70–99)
Sodium: 140 mEq/L (ref 135–145)
Total Bilirubin: 1.1 mg/dL (ref 0.3–1.2)

## 2012-04-17 NOTE — Telephone Encounter (Signed)
Patient requested a refill on his Vyvanse. Patient would like to pick it up Monday when he comes for his appointment.

## 2012-04-18 MED ORDER — LISDEXAMFETAMINE DIMESYLATE 40 MG PO CAPS: 40.0000 mg | ORAL_CAPSULE | ORAL | Status: DC

## 2012-04-18 NOTE — Telephone Encounter (Signed)
Printed, please hold for patient after I sign.

## 2012-04-18 NOTE — Telephone Encounter (Signed)
Patient advised.  Rx left at front desk for pick up. 

## 2012-04-22 ENCOUNTER — Ambulatory Visit (INDEPENDENT_AMBULATORY_CARE_PROVIDER_SITE_OTHER): Payer: 59 | Admitting: Family Medicine

## 2012-04-22 ENCOUNTER — Encounter: Payer: Self-pay | Admitting: Family Medicine

## 2012-04-22 VITALS — BP 112/80 | HR 71 | Temp 98.0°F | Ht 71.0 in | Wt 240.0 lb

## 2012-04-22 DIAGNOSIS — Z23 Encounter for immunization: Secondary | ICD-10-CM

## 2012-04-22 DIAGNOSIS — Z0289 Encounter for other administrative examinations: Secondary | ICD-10-CM

## 2012-04-22 DIAGNOSIS — Z0000 Encounter for general adult medical examination without abnormal findings: Secondary | ICD-10-CM

## 2012-04-22 DIAGNOSIS — Z86711 Personal history of pulmonary embolism: Secondary | ICD-10-CM

## 2012-04-22 DIAGNOSIS — Z5181 Encounter for therapeutic drug level monitoring: Secondary | ICD-10-CM

## 2012-04-22 DIAGNOSIS — Z7901 Long term (current) use of anticoagulants: Secondary | ICD-10-CM

## 2012-04-22 DIAGNOSIS — E7800 Pure hypercholesterolemia, unspecified: Secondary | ICD-10-CM

## 2012-04-22 DIAGNOSIS — F988 Other specified behavioral and emotional disorders with onset usually occurring in childhood and adolescence: Secondary | ICD-10-CM

## 2012-04-22 LAB — POCT INR: INR: 2.6

## 2012-04-22 MED ORDER — LISDEXAMFETAMINE DIMESYLATE 40 MG PO CAPS: 40.0000 mg | ORAL_CAPSULE | ORAL | Status: DC

## 2012-04-22 NOTE — Patient Instructions (Signed)
Continue current dose, check in 4 weeks  

## 2012-04-22 NOTE — Patient Instructions (Addendum)
Try to get more exercise.  Don't change you meds for now.  Take care. Call with concerns.  Glad to see you.  Recheck in 1 year.

## 2012-04-23 NOTE — Progress Notes (Signed)
CPE- See plan.  Routine anticipatory guidance given to patient.  See health maintenance. Tetanus 2013 Flu 2013 Advance directive d/w pt.  He has a living will.  Wife is HCPOA No indication for early prostate, colon ca screening.  Discussed.    Elevated Cholesterol: No meds Diet compliance: discussed.  Exercise: needs to increase. Labs discussed.   ADD Compliant with meds:yes benefit from med (ie increase in concentration):yes change in mood:no change in appetite:no Insomnia: occ, tolerable tremor:no compliant with behavioral modification:yes  H/o PE, compliant with coumadin.  No ADE.  No sig bleeding- occ epistaxis during allergy season.  FH DVT.    PMH and SH reviewed  ROS: See HPI.  Otherwise noncontributory.   Meds, vitals, and allergies reviewed.   GEN: nad, alert and oriented, affect wnl and appropriate HEENT: mucous membranes moist NECK: supple w/o LA CV: rrr.  PULM: ctab, no inc wob ABD: soft, +bs EXT: no edema CN 2-12 wnl, s/s/dtr wnl x4.  No tremor.

## 2012-04-23 NOTE — Assessment & Plan Note (Signed)
Work on diet/exercise, recheck yearly.  No meds at this point.

## 2012-04-23 NOTE — Assessment & Plan Note (Signed)
Continue anticoagulation.  Doing well with treatment.  Benefit continues to outweigh risk of tx.

## 2012-04-23 NOTE — Assessment & Plan Note (Signed)
Routine anticipatory guidance given to patient. See health maintenance.  Tetanus 2013  Flu 2013  Advance directive d/w pt. He has a living will. Wife is HCPOA  No indication for early prostate, colon ca screening. Discussed.

## 2012-04-23 NOTE — Assessment & Plan Note (Signed)
Continue with current dose.  Inc exercise as this may help.  He agrees.  3 months of rxs given to patient. 2 rxs postdated with fill on/after dating.

## 2012-05-24 ENCOUNTER — Ambulatory Visit: Payer: 59

## 2012-06-18 ENCOUNTER — Ambulatory Visit (INDEPENDENT_AMBULATORY_CARE_PROVIDER_SITE_OTHER): Payer: 59 | Admitting: General Practice

## 2012-06-18 DIAGNOSIS — Z5181 Encounter for therapeutic drug level monitoring: Secondary | ICD-10-CM

## 2012-06-18 DIAGNOSIS — Z7901 Long term (current) use of anticoagulants: Secondary | ICD-10-CM

## 2012-06-18 DIAGNOSIS — Z86711 Personal history of pulmonary embolism: Secondary | ICD-10-CM

## 2012-07-09 ENCOUNTER — Other Ambulatory Visit: Payer: Self-pay | Admitting: *Deleted

## 2012-07-09 MED ORDER — VARDENAFIL HCL 10 MG PO TABS: 10.0000 mg | ORAL_TABLET | ORAL | Status: DC

## 2012-07-09 MED ORDER — WARFARIN SODIUM 3 MG PO TABS: ORAL_TABLET | ORAL | Status: DC

## 2012-07-09 NOTE — Telephone Encounter (Signed)
Faxed refill request. Please advise.  

## 2012-07-09 NOTE — Telephone Encounter (Signed)
Printed

## 2012-07-09 NOTE — Telephone Encounter (Signed)
Faxed

## 2012-07-11 ENCOUNTER — Ambulatory Visit (INDEPENDENT_AMBULATORY_CARE_PROVIDER_SITE_OTHER): Payer: 59 | Admitting: General Practice

## 2012-07-11 DIAGNOSIS — Z7901 Long term (current) use of anticoagulants: Secondary | ICD-10-CM

## 2012-07-11 DIAGNOSIS — Z86711 Personal history of pulmonary embolism: Secondary | ICD-10-CM

## 2012-07-11 DIAGNOSIS — Z5181 Encounter for therapeutic drug level monitoring: Secondary | ICD-10-CM

## 2012-08-12 ENCOUNTER — Ambulatory Visit (INDEPENDENT_AMBULATORY_CARE_PROVIDER_SITE_OTHER): Payer: 59 | Admitting: General Practice

## 2012-08-12 DIAGNOSIS — Z7901 Long term (current) use of anticoagulants: Secondary | ICD-10-CM

## 2012-08-12 DIAGNOSIS — Z5181 Encounter for therapeutic drug level monitoring: Secondary | ICD-10-CM

## 2012-08-12 DIAGNOSIS — Z86711 Personal history of pulmonary embolism: Secondary | ICD-10-CM

## 2012-09-09 ENCOUNTER — Ambulatory Visit (INDEPENDENT_AMBULATORY_CARE_PROVIDER_SITE_OTHER): Payer: 59 | Admitting: General Practice

## 2012-09-09 DIAGNOSIS — Z86711 Personal history of pulmonary embolism: Secondary | ICD-10-CM

## 2012-09-09 DIAGNOSIS — Z5181 Encounter for therapeutic drug level monitoring: Secondary | ICD-10-CM

## 2012-09-09 DIAGNOSIS — Z7901 Long term (current) use of anticoagulants: Secondary | ICD-10-CM

## 2012-10-21 ENCOUNTER — Ambulatory Visit (INDEPENDENT_AMBULATORY_CARE_PROVIDER_SITE_OTHER): Payer: 59 | Admitting: General Practice

## 2012-10-21 ENCOUNTER — Encounter: Payer: Self-pay | Admitting: General Practice

## 2012-10-21 DIAGNOSIS — Z5181 Encounter for therapeutic drug level monitoring: Secondary | ICD-10-CM

## 2012-10-21 DIAGNOSIS — Z86711 Personal history of pulmonary embolism: Secondary | ICD-10-CM

## 2012-10-21 DIAGNOSIS — Z7901 Long term (current) use of anticoagulants: Secondary | ICD-10-CM

## 2012-10-21 LAB — POCT INR: INR: 2.8

## 2012-10-22 ENCOUNTER — Other Ambulatory Visit: Payer: Self-pay

## 2012-10-22 NOTE — Telephone Encounter (Signed)
Pt left note requesting 3 prescriptions written for vyvanse. Call when ready for pick up. (usually write one rx for Vyvanse; Annice Pih at [facility] said last time got Vyvanse was 07/08/12).Please advise.

## 2012-10-23 MED ORDER — LISDEXAMFETAMINE DIMESYLATE 40 MG PO CAPS: 40.0000 mg | ORAL_CAPSULE | ORAL | Status: DC

## 2012-10-23 NOTE — Telephone Encounter (Signed)
Printed.  In my office. Thanks.

## 2012-10-23 NOTE — Telephone Encounter (Signed)
Spoke with patient and advised rx ready for pick-up and it will be at the front desk.  

## 2012-12-02 ENCOUNTER — Encounter: Payer: Self-pay | Admitting: Family Medicine

## 2012-12-02 ENCOUNTER — Ambulatory Visit (INDEPENDENT_AMBULATORY_CARE_PROVIDER_SITE_OTHER): Payer: 59 | Admitting: Family Medicine

## 2012-12-02 DIAGNOSIS — Z5181 Encounter for therapeutic drug level monitoring: Secondary | ICD-10-CM

## 2012-12-02 DIAGNOSIS — Z7901 Long term (current) use of anticoagulants: Secondary | ICD-10-CM

## 2012-12-02 DIAGNOSIS — Z86711 Personal history of pulmonary embolism: Secondary | ICD-10-CM

## 2012-12-02 LAB — POCT INR: INR: 2.6

## 2012-12-18 ENCOUNTER — Ambulatory Visit (INDEPENDENT_AMBULATORY_CARE_PROVIDER_SITE_OTHER): Payer: 59 | Admitting: Family Medicine

## 2012-12-18 ENCOUNTER — Encounter: Payer: Self-pay | Admitting: Family Medicine

## 2012-12-18 VITALS — BP 112/70 | HR 72 | Temp 98.3°F | Wt 240.0 lb

## 2012-12-18 DIAGNOSIS — F988 Other specified behavioral and emotional disorders with onset usually occurring in childhood and adolescence: Secondary | ICD-10-CM

## 2012-12-18 NOTE — Progress Notes (Signed)
ADD on meds, with effect.  Recreational MJ, admits.  D/w pt about drug screen.  See plan.   Meds, vitals, and allergies reviewed.   ROS: See HPI.  Otherwise, noncontributory.  nad ncat rrr ctab No tremor.  Speech wnl.

## 2012-12-18 NOTE — Assessment & Plan Note (Signed)
He'll need monthly UDS, THC level should dec gradually then zero out.  Discussed cessation of MJ.  D/w pt about plan- if level increases, then we can't rx controls.  He understands.  Not using MJ for ADD.

## 2012-12-18 NOTE — Patient Instructions (Addendum)
We'll plan to recheck your urine monthly.  Come back in about 30 days- lab visit.  Take care.  Glad to see you.

## 2012-12-24 ENCOUNTER — Encounter: Payer: Self-pay | Admitting: Family Medicine

## 2013-01-13 ENCOUNTER — Ambulatory Visit (INDEPENDENT_AMBULATORY_CARE_PROVIDER_SITE_OTHER): Payer: 59 | Admitting: Family Medicine

## 2013-01-13 DIAGNOSIS — Z5181 Encounter for therapeutic drug level monitoring: Secondary | ICD-10-CM

## 2013-01-13 DIAGNOSIS — Z7901 Long term (current) use of anticoagulants: Secondary | ICD-10-CM

## 2013-01-13 DIAGNOSIS — Z86711 Personal history of pulmonary embolism: Secondary | ICD-10-CM

## 2013-01-13 LAB — POCT INR: INR: 3.9

## 2013-01-30 ENCOUNTER — Encounter: Payer: Self-pay | Admitting: Family Medicine

## 2013-01-30 ENCOUNTER — Ambulatory Visit: Payer: 59

## 2013-02-04 ENCOUNTER — Ambulatory Visit: Payer: 59 | Admitting: Family Medicine

## 2013-02-06 ENCOUNTER — Encounter: Payer: Self-pay | Admitting: Family Medicine

## 2013-02-06 ENCOUNTER — Ambulatory Visit (INDEPENDENT_AMBULATORY_CARE_PROVIDER_SITE_OTHER): Payer: 59 | Admitting: Family Medicine

## 2013-02-06 VITALS — BP 132/100 | HR 84 | Temp 98.0°F | Wt 240.0 lb

## 2013-02-06 DIAGNOSIS — L255 Unspecified contact dermatitis due to plants, except food: Secondary | ICD-10-CM

## 2013-02-06 DIAGNOSIS — Z5181 Encounter for therapeutic drug level monitoring: Secondary | ICD-10-CM

## 2013-02-06 DIAGNOSIS — Z7901 Long term (current) use of anticoagulants: Secondary | ICD-10-CM

## 2013-02-06 DIAGNOSIS — Z86711 Personal history of pulmonary embolism: Secondary | ICD-10-CM

## 2013-02-06 LAB — POCT INR: INR: 3.8

## 2013-02-06 MED ORDER — TRIAMCINOLONE ACETONIDE 0.1 % EX CREA: TOPICAL_CREAM | Freq: Two times a day (BID) | CUTANEOUS | Status: DC

## 2013-02-06 NOTE — Patient Instructions (Addendum)
Use the cream twice a day if needed and this should improve. Take care.

## 2013-02-07 DIAGNOSIS — L255 Unspecified contact dermatitis due to plants, except food: Secondary | ICD-10-CM | POA: Insufficient documentation

## 2013-02-07 NOTE — Progress Notes (Signed)
UDS d/w pt.  THC level much lower.  No more use.  We can f/u later with repeat testing.   Irregular itchy rash on the R forearm and R lower abd after pulling weeds. No FCNAVD.  Feels well o/w.  Asking about options.  He reports small vesicles prev on the rash.   Meds, vitals, and allergies reviewed.   ROS: See HPI.  Otherwise, noncontributory.  nad Blanching red irregular rash w/o ulceration on the R forearm and R lower abd.

## 2013-02-07 NOTE — Assessment & Plan Note (Signed)
Presumed, typical history.  Nontoxic.  Doesn't appear infected.  Would use topical TAC and fu prn.

## 2013-02-11 ENCOUNTER — Encounter: Payer: Self-pay | Admitting: Family Medicine

## 2013-02-27 ENCOUNTER — Ambulatory Visit: Payer: 59

## 2013-03-12 ENCOUNTER — Telehealth: Payer: Self-pay

## 2013-03-12 NOTE — Telephone Encounter (Signed)
Prior authorization required for Vyvanse; form in Dr Lianne Bushy in box.

## 2013-03-12 NOTE — Telephone Encounter (Signed)
I'll address the hard copy.  

## 2013-03-13 ENCOUNTER — Ambulatory Visit (INDEPENDENT_AMBULATORY_CARE_PROVIDER_SITE_OTHER): Payer: 59 | Admitting: Family Medicine

## 2013-03-13 DIAGNOSIS — Z7901 Long term (current) use of anticoagulants: Secondary | ICD-10-CM

## 2013-03-13 DIAGNOSIS — Z5181 Encounter for therapeutic drug level monitoring: Secondary | ICD-10-CM

## 2013-03-13 DIAGNOSIS — Z86711 Personal history of pulmonary embolism: Secondary | ICD-10-CM

## 2013-04-10 ENCOUNTER — Ambulatory Visit (INDEPENDENT_AMBULATORY_CARE_PROVIDER_SITE_OTHER): Payer: 59 | Admitting: Family Medicine

## 2013-04-10 DIAGNOSIS — Z5181 Encounter for therapeutic drug level monitoring: Secondary | ICD-10-CM

## 2013-04-10 DIAGNOSIS — Z86711 Personal history of pulmonary embolism: Secondary | ICD-10-CM

## 2013-04-10 DIAGNOSIS — Z7901 Long term (current) use of anticoagulants: Secondary | ICD-10-CM

## 2013-04-10 LAB — POCT INR: INR: 1.8

## 2013-05-08 ENCOUNTER — Ambulatory Visit (INDEPENDENT_AMBULATORY_CARE_PROVIDER_SITE_OTHER): Payer: 59 | Admitting: Family Medicine

## 2013-05-08 ENCOUNTER — Other Ambulatory Visit: Payer: Self-pay

## 2013-05-08 DIAGNOSIS — Z23 Encounter for immunization: Secondary | ICD-10-CM

## 2013-05-08 DIAGNOSIS — Z5181 Encounter for therapeutic drug level monitoring: Secondary | ICD-10-CM

## 2013-05-08 DIAGNOSIS — Z7901 Long term (current) use of anticoagulants: Secondary | ICD-10-CM

## 2013-05-08 DIAGNOSIS — Z86711 Personal history of pulmonary embolism: Secondary | ICD-10-CM

## 2013-05-08 LAB — POCT INR: INR: 2.5

## 2013-06-12 ENCOUNTER — Ambulatory Visit (INDEPENDENT_AMBULATORY_CARE_PROVIDER_SITE_OTHER): Payer: 59 | Admitting: Family Medicine

## 2013-06-12 DIAGNOSIS — Z5181 Encounter for therapeutic drug level monitoring: Secondary | ICD-10-CM

## 2013-06-12 DIAGNOSIS — Z7901 Long term (current) use of anticoagulants: Secondary | ICD-10-CM

## 2013-06-12 DIAGNOSIS — Z86711 Personal history of pulmonary embolism: Secondary | ICD-10-CM

## 2013-06-12 LAB — POCT INR: INR: 1.1

## 2013-07-07 ENCOUNTER — Ambulatory Visit (INDEPENDENT_AMBULATORY_CARE_PROVIDER_SITE_OTHER): Payer: 59 | Admitting: Family Medicine

## 2013-07-07 DIAGNOSIS — Z86711 Personal history of pulmonary embolism: Secondary | ICD-10-CM

## 2013-07-07 DIAGNOSIS — Z7901 Long term (current) use of anticoagulants: Secondary | ICD-10-CM

## 2013-07-07 DIAGNOSIS — Z5181 Encounter for therapeutic drug level monitoring: Secondary | ICD-10-CM

## 2013-07-07 LAB — POCT INR: INR: 2.1

## 2013-07-10 ENCOUNTER — Other Ambulatory Visit: Payer: Self-pay

## 2013-07-10 NOTE — Telephone Encounter (Signed)
It doesn't look like he has taken this since May. Why was it stopped?

## 2013-07-10 NOTE — Telephone Encounter (Signed)
Pt left v/m requesting rx vyvanse. Call when ready for pick up. 

## 2013-07-11 ENCOUNTER — Other Ambulatory Visit: Payer: Self-pay | Admitting: Internal Medicine

## 2013-07-11 MED ORDER — LISDEXAMFETAMINE DIMESYLATE 40 MG PO CAPS: 40.0000 mg | ORAL_CAPSULE | ORAL | Status: DC

## 2013-07-11 NOTE — Telephone Encounter (Signed)
Rx left in front office for pick up and pt is aware  

## 2013-07-11 NOTE — Telephone Encounter (Signed)
Pt states Dr Damita Dunnings gave him 3 separate written Rx for medication and he last filled in 05/2013. Pt also states he does not take medication on the weekends. He wants to know if you could do the same so he will not have to come back so often--please advise

## 2013-07-11 NOTE — Telephone Encounter (Signed)
Left message on voicemail for pt to return call  

## 2013-08-18 ENCOUNTER — Ambulatory Visit: Payer: 59

## 2013-08-21 ENCOUNTER — Ambulatory Visit (INDEPENDENT_AMBULATORY_CARE_PROVIDER_SITE_OTHER): Payer: 59 | Admitting: Family Medicine

## 2013-08-21 DIAGNOSIS — Z7901 Long term (current) use of anticoagulants: Secondary | ICD-10-CM

## 2013-08-21 DIAGNOSIS — Z5181 Encounter for therapeutic drug level monitoring: Secondary | ICD-10-CM

## 2013-08-21 DIAGNOSIS — Z86711 Personal history of pulmonary embolism: Secondary | ICD-10-CM

## 2013-08-21 LAB — POCT INR: INR: 2

## 2013-09-02 ENCOUNTER — Other Ambulatory Visit: Payer: Self-pay | Admitting: Family Medicine

## 2013-10-02 ENCOUNTER — Ambulatory Visit: Payer: 59

## 2013-10-02 ENCOUNTER — Ambulatory Visit (INDEPENDENT_AMBULATORY_CARE_PROVIDER_SITE_OTHER): Payer: 59 | Admitting: Family Medicine

## 2013-10-02 DIAGNOSIS — Z5181 Encounter for therapeutic drug level monitoring: Secondary | ICD-10-CM

## 2013-10-02 DIAGNOSIS — Z7901 Long term (current) use of anticoagulants: Secondary | ICD-10-CM

## 2013-10-02 DIAGNOSIS — Z86711 Personal history of pulmonary embolism: Secondary | ICD-10-CM

## 2013-10-02 LAB — POCT INR: INR: 3.4

## 2013-11-13 ENCOUNTER — Other Ambulatory Visit: Payer: Self-pay | Admitting: Family Medicine

## 2013-11-13 ENCOUNTER — Ambulatory Visit (INDEPENDENT_AMBULATORY_CARE_PROVIDER_SITE_OTHER): Payer: 59 | Admitting: Family Medicine

## 2013-11-13 ENCOUNTER — Other Ambulatory Visit (INDEPENDENT_AMBULATORY_CARE_PROVIDER_SITE_OTHER): Payer: 59

## 2013-11-13 ENCOUNTER — Ambulatory Visit: Payer: 59

## 2013-11-13 DIAGNOSIS — Z7901 Long term (current) use of anticoagulants: Secondary | ICD-10-CM

## 2013-11-13 DIAGNOSIS — Z86711 Personal history of pulmonary embolism: Secondary | ICD-10-CM

## 2013-11-13 DIAGNOSIS — E7800 Pure hypercholesterolemia, unspecified: Secondary | ICD-10-CM

## 2013-11-13 DIAGNOSIS — Z5181 Encounter for therapeutic drug level monitoring: Secondary | ICD-10-CM

## 2013-11-13 LAB — COMPREHENSIVE METABOLIC PANEL
ALK PHOS: 43 U/L (ref 39–117)
ALT: 40 U/L (ref 0–53)
AST: 34 U/L (ref 0–37)
Albumin: 4 g/dL (ref 3.5–5.2)
BILIRUBIN TOTAL: 1.1 mg/dL (ref 0.2–1.2)
BUN: 15 mg/dL (ref 6–23)
CO2: 27 mEq/L (ref 19–32)
CREATININE: 1.3 mg/dL (ref 0.4–1.5)
Calcium: 8.9 mg/dL (ref 8.4–10.5)
Chloride: 107 mEq/L (ref 96–112)
GFR: 64.94 mL/min (ref >60.00)
GLUCOSE: 96 mg/dL (ref 70–99)
POTASSIUM: 4 meq/L (ref 3.5–5.1)
Sodium: 138 mEq/L (ref 135–145)
Total Protein: 6.7 g/dL (ref 6.0–8.3)

## 2013-11-13 LAB — CBC WITH DIFFERENTIAL/PLATELET
BASOS ABS: 0 10*3/uL (ref 0.0–0.1)
Basophils Relative: 0.8 % (ref 0.0–3.0)
EOS ABS: 0.3 10*3/uL (ref 0.0–0.7)
Eosinophils Relative: 6.5 % — ABNORMAL HIGH (ref 0.0–5.0)
HCT: 47.5 % (ref 39.0–52.0)
HEMOGLOBIN: 16.5 g/dL (ref 13.0–17.0)
LYMPHS PCT: 26.9 % (ref 12.0–46.0)
Lymphs Abs: 1.3 10*3/uL (ref 0.7–4.0)
MCHC: 34.8 g/dL (ref 30.0–36.0)
MCV: 89.3 fl (ref 78.0–100.0)
MONOS PCT: 8.7 % (ref 3.0–12.0)
Monocytes Absolute: 0.4 10*3/uL (ref 0.1–1.0)
NEUTROS ABS: 2.8 10*3/uL (ref 1.4–7.7)
Neutrophils Relative %: 57.1 % (ref 43.0–77.0)
Platelets: 228 10*3/uL (ref 150.0–400.0)
RBC: 5.31 Mil/uL (ref 4.22–5.81)
RDW: 12.7 % (ref 11.5–15.5)
WBC: 4.8 10*3/uL (ref 4.0–10.5)

## 2013-11-13 LAB — LIPID PANEL
CHOLESTEROL: 206 mg/dL — AB (ref 0–200)
HDL: 52 mg/dL (ref >39.00)
LDL Cholesterol: 124 mg/dL — ABNORMAL HIGH (ref 0–99)
TRIGLYCERIDES: 152 mg/dL — AB (ref 0.0–149.0)
Total CHOL/HDL Ratio: 4
VLDL: 30.4 mg/dL (ref 0.0–40.0)

## 2013-11-13 LAB — POCT INR: INR: 5.4

## 2013-11-18 ENCOUNTER — Ambulatory Visit (INDEPENDENT_AMBULATORY_CARE_PROVIDER_SITE_OTHER): Payer: 59 | Admitting: Family Medicine

## 2013-11-18 ENCOUNTER — Encounter: Payer: Self-pay | Admitting: Family Medicine

## 2013-11-18 VITALS — BP 144/92 | HR 72 | Temp 97.7°F | Ht 70.5 in | Wt 244.2 lb

## 2013-11-18 DIAGNOSIS — Z0000 Encounter for general adult medical examination without abnormal findings: Secondary | ICD-10-CM

## 2013-11-18 DIAGNOSIS — F988 Other specified behavioral and emotional disorders with onset usually occurring in childhood and adolescence: Secondary | ICD-10-CM

## 2013-11-18 DIAGNOSIS — N529 Male erectile dysfunction, unspecified: Secondary | ICD-10-CM

## 2013-11-18 DIAGNOSIS — Z86711 Personal history of pulmonary embolism: Secondary | ICD-10-CM

## 2013-11-18 MED ORDER — LISDEXAMFETAMINE DIMESYLATE 40 MG PO CAPS: 40.0000 mg | ORAL_CAPSULE | ORAL | Status: DC

## 2013-11-18 MED ORDER — VARDENAFIL HCL 20 MG PO TABS: 10.0000 mg | ORAL_TABLET | ORAL | Status: DC

## 2013-11-18 NOTE — Patient Instructions (Addendum)
Let me know when you need a referral for a colonoscopy.   Keep working on diet and exercise and if your BP stays >140/>90, then let me know.   Take care.

## 2013-11-18 NOTE — Progress Notes (Signed)
Pre visit review using our clinic review tool, if applicable. No additional management support is needed unless otherwise documented below in the visit note.  CPE- See plan.  Routine anticipatory guidance given to patient.  See health maintenance. Tetanus 2013 Flu shot 2014 Prostate cancer screening and PSA options (with potential risks and benefits of testing vs not testing) were discussed along with recent recs/guidelines.  He declined testing PSA at this point. D/w patient [id number] for colon cancer screening, including IFOB vs. colonoscopy.  Risks and benefits of both were discussed and patient voiced understanding.  Pt elects for: likely colonoscopy.   Living will d/w pt.  Would have his wife designated if he were incapacitated.   Diet and exercise d/w pt.   Borderline BPs. D/w pt.  See below.   H/o PE,  INRs reviewed. Has f/u with anticoag clinic pending.    ED. Less effect from levitra.  No CP, SOB, BLE edema. Exercise tolerance is still good.    ADD.  No ADE on meds.  Good effect.  Not taken everyday, on weekends.  Can sleep well, able to focus.  This seems to be the right dose for him now.    PMH and SH reviewed  Meds, vitals, and allergies reviewed.   ROS: See HPI.  Otherwise negative.    GEN: nad, alert and oriented HEENT: mucous membranes moist NECK: supple w/o LA CV: rrr. PULM: ctab, no inc wob ABD: soft, +bs EXT: no edema SKIN: no acute rash

## 2013-11-19 NOTE — Assessment & Plan Note (Signed)
Routine anticipatory guidance given to patient.  See health maintenance. Tetanus 2013 Flu shot 2014 Prostate cancer screening and PSA options (with potential risks and benefits of testing vs not testing) were discussed along with recent recs/guidelines.  He declined testing PSA at this point. D/w patient [id number] for colon cancer screening, including IFOB vs. colonoscopy.  Risks and benefits of both were discussed and patient voiced understanding.  Pt elects for: likely colonoscopy.   Living will d/w pt.  Would have his wife designated if he were incapacitated.   Diet and exercise d/w pt.

## 2013-11-19 NOTE — Assessment & Plan Note (Addendum)
No ADE on meds.  Good effect.  Not taken everyday, on weekends.  Can sleep well, able to focus.  This seems to be the right dose for him now.  He'll keep working on diet and exercise and his BP stays >140/>90, then he'll let me know.

## 2013-11-19 NOTE — Assessment & Plan Note (Signed)
No bleeding, continue routine INR fu as scheduled.

## 2013-11-19 NOTE — Assessment & Plan Note (Signed)
Can try higher dose of levitra as needed.  D/w pt.

## 2013-11-27 ENCOUNTER — Ambulatory Visit: Payer: 59

## 2013-11-27 ENCOUNTER — Ambulatory Visit (INDEPENDENT_AMBULATORY_CARE_PROVIDER_SITE_OTHER): Payer: 59 | Admitting: Family Medicine

## 2013-11-27 DIAGNOSIS — Z7901 Long term (current) use of anticoagulants: Secondary | ICD-10-CM

## 2013-11-27 DIAGNOSIS — Z86711 Personal history of pulmonary embolism: Secondary | ICD-10-CM

## 2013-11-27 DIAGNOSIS — Z5181 Encounter for therapeutic drug level monitoring: Secondary | ICD-10-CM

## 2013-11-27 LAB — POCT INR: INR: 3.8

## 2013-12-22 ENCOUNTER — Ambulatory Visit: Payer: 59

## 2013-12-25 ENCOUNTER — Ambulatory Visit (INDEPENDENT_AMBULATORY_CARE_PROVIDER_SITE_OTHER): Payer: 59 | Admitting: Family Medicine

## 2013-12-25 DIAGNOSIS — Z86711 Personal history of pulmonary embolism: Secondary | ICD-10-CM

## 2013-12-25 DIAGNOSIS — Z7901 Long term (current) use of anticoagulants: Secondary | ICD-10-CM

## 2013-12-25 DIAGNOSIS — Z5181 Encounter for therapeutic drug level monitoring: Secondary | ICD-10-CM

## 2013-12-25 LAB — POCT INR: INR: 2

## 2013-12-29 ENCOUNTER — Other Ambulatory Visit: Payer: Self-pay | Admitting: Family Medicine

## 2014-01-22 ENCOUNTER — Ambulatory Visit (INDEPENDENT_AMBULATORY_CARE_PROVIDER_SITE_OTHER): Payer: 59 | Admitting: Family Medicine

## 2014-01-22 DIAGNOSIS — Z86711 Personal history of pulmonary embolism: Secondary | ICD-10-CM

## 2014-01-22 DIAGNOSIS — Z5181 Encounter for therapeutic drug level monitoring: Secondary | ICD-10-CM

## 2014-01-22 DIAGNOSIS — Z7901 Long term (current) use of anticoagulants: Secondary | ICD-10-CM

## 2014-01-22 LAB — POCT INR: INR: 1.9

## 2014-02-11 ENCOUNTER — Encounter: Payer: Self-pay | Admitting: Family Medicine

## 2014-02-11 ENCOUNTER — Ambulatory Visit (INDEPENDENT_AMBULATORY_CARE_PROVIDER_SITE_OTHER): Payer: 59 | Admitting: Family Medicine

## 2014-02-11 VITALS — BP 130/90 | HR 70 | Temp 98.2°F | Wt 239.0 lb

## 2014-02-11 DIAGNOSIS — N529 Male erectile dysfunction, unspecified: Secondary | ICD-10-CM

## 2014-02-11 DIAGNOSIS — Z125 Encounter for screening for malignant neoplasm of prostate: Secondary | ICD-10-CM

## 2014-02-11 DIAGNOSIS — Z1211 Encounter for screening for malignant neoplasm of colon: Secondary | ICD-10-CM

## 2014-02-11 NOTE — Progress Notes (Signed)
Pre visit review using our clinic review tool, if applicable. No additional management support is needed unless otherwise documented below in the visit note.  D/w patient [id number] for colon cancer screening, including IFOB vs. colonoscopy.  Risks and benefits of both were discussed and patient voiced understanding.  Pt elects for: colonoscopy.    H/o PE.  Still on coumadin. Discussed options. He was asking about other oral agents.  We talked about tabling the decision until after the colonoscopy is done.    ED.  20mg  of levitra didn't help a lot.  Dec in libido. Asking about T replacement.  Discussed pros and cons, risks. Would need labs first.  D/w pt.  Return for labs.   Meds, vitals, and allergies reviewed.   ROS: See HPI.  Otherwise, noncontributory.  nad ncat Mmm rrr ctab abd soft Ext w/o edema

## 2014-02-11 NOTE — Patient Instructions (Addendum)
Ronald Hatfield will call about your referral. I'll set up the labs in the meantime- early AM lab draw.  We'll see about the coumadin substitutes after the colonoscopy is done.

## 2014-02-13 ENCOUNTER — Encounter: Payer: Self-pay | Admitting: Family Medicine

## 2014-02-16 ENCOUNTER — Encounter: Payer: Self-pay | Admitting: Gastroenterology

## 2014-02-19 ENCOUNTER — Ambulatory Visit: Payer: 59

## 2014-02-19 ENCOUNTER — Other Ambulatory Visit (INDEPENDENT_AMBULATORY_CARE_PROVIDER_SITE_OTHER): Payer: 59

## 2014-02-19 ENCOUNTER — Ambulatory Visit (INDEPENDENT_AMBULATORY_CARE_PROVIDER_SITE_OTHER): Payer: 59 | Admitting: *Deleted

## 2014-02-19 DIAGNOSIS — Z5181 Encounter for therapeutic drug level monitoring: Secondary | ICD-10-CM

## 2014-02-19 DIAGNOSIS — Z86711 Personal history of pulmonary embolism: Secondary | ICD-10-CM

## 2014-02-19 DIAGNOSIS — N529 Male erectile dysfunction, unspecified: Secondary | ICD-10-CM

## 2014-02-19 DIAGNOSIS — Z125 Encounter for screening for malignant neoplasm of prostate: Secondary | ICD-10-CM

## 2014-02-19 DIAGNOSIS — Z7901 Long term (current) use of anticoagulants: Secondary | ICD-10-CM

## 2014-02-19 LAB — LUTEINIZING HORMONE: LH: 2.95 m[IU]/mL (ref 1.50–9.30)

## 2014-02-19 LAB — PSA: PSA: 0.66 ng/mL (ref 0.10–4.00)

## 2014-02-19 LAB — POCT INR: INR: 1.5

## 2014-02-19 LAB — TESTOSTERONE: TESTOSTERONE: 254.38 ng/dL — AB (ref 300.00–890.00)

## 2014-03-19 ENCOUNTER — Ambulatory Visit (INDEPENDENT_AMBULATORY_CARE_PROVIDER_SITE_OTHER): Payer: 59 | Admitting: Family Medicine

## 2014-03-19 ENCOUNTER — Telehealth: Payer: Self-pay | Admitting: Family Medicine

## 2014-03-19 DIAGNOSIS — Z86711 Personal history of pulmonary embolism: Secondary | ICD-10-CM

## 2014-03-19 DIAGNOSIS — Z5181 Encounter for therapeutic drug level monitoring: Secondary | ICD-10-CM

## 2014-03-19 DIAGNOSIS — E291 Testicular hypofunction: Secondary | ICD-10-CM

## 2014-03-19 DIAGNOSIS — Z7901 Long term (current) use of anticoagulants: Secondary | ICD-10-CM

## 2014-03-19 LAB — POCT INR: INR: 2.7

## 2014-03-19 MED ORDER — LISDEXAMFETAMINE DIMESYLATE 40 MG PO CAPS: 40.0000 mg | ORAL_CAPSULE | ORAL | Status: DC

## 2014-03-19 MED ORDER — CELECOXIB 100 MG PO CAPS: 100.0000 mg | ORAL_CAPSULE | Freq: Every day | ORAL | Status: DC

## 2014-03-19 NOTE — Telephone Encounter (Signed)
Pt also wants clarification on a potential Urology referral per Dr. Damita Dunnings.  Due to low testosterone.

## 2014-03-19 NOTE — Telephone Encounter (Signed)
Pt walked in and requested refill on lisdexamfetamine (VYVANSE) 40 MG capsule [[id number]] and celecoxib (CELEBREX) 100 MG capsule [[id number].  Pharmacy of choice is [facility].  Best number to reach pt is [phone] / lt  Pt requests 90 day supply

## 2014-03-19 NOTE — Telephone Encounter (Signed)
Detailed message left on answering machine regarding the referral and advised that script is up front ready for pickup.

## 2014-03-19 NOTE — Telephone Encounter (Signed)
Referral placed. Thanks. rxs done.

## 2014-04-07 ENCOUNTER — Other Ambulatory Visit: Payer: Self-pay | Admitting: Family Medicine

## 2014-04-28 ENCOUNTER — Telehealth: Payer: Self-pay | Admitting: *Deleted

## 2014-04-28 ENCOUNTER — Ambulatory Visit (INDEPENDENT_AMBULATORY_CARE_PROVIDER_SITE_OTHER): Payer: 59 | Admitting: Gastroenterology

## 2014-04-28 ENCOUNTER — Encounter: Payer: Self-pay | Admitting: Gastroenterology

## 2014-04-28 VITALS — BP 134/80 | HR 76 | Ht 70.5 in | Wt 238.0 lb

## 2014-04-28 DIAGNOSIS — Z1212 Encounter for screening for malignant neoplasm of rectum: Secondary | ICD-10-CM

## 2014-04-28 DIAGNOSIS — Z5181 Encounter for therapeutic drug level monitoring: Secondary | ICD-10-CM

## 2014-04-28 DIAGNOSIS — I2699 Other pulmonary embolism without acute cor pulmonale: Secondary | ICD-10-CM

## 2014-04-28 DIAGNOSIS — Z1211 Encounter for screening for malignant neoplasm of colon: Secondary | ICD-10-CM | POA: Insufficient documentation

## 2014-04-28 NOTE — Addendum Note (Signed)
Addended by: Oda Kilts on: 04/28/2014 02:22 PM   Modules accepted: Orders

## 2014-04-28 NOTE — Assessment & Plan Note (Signed)
Patient will be scheduled for screening colonoscopy.  We'll check with the patient's PCP whether Coumadin can be held.  The risk of holding anticoagulation therapy or antiplatelet medications was discussed including the increased risk for thromboembolic disease that may include DVT, pulmonary emboli and stroke. The patient understands this risk and is willing to proceed with temporally holding the medication provided that this is approved by her PCP or cardiologist.

## 2014-04-28 NOTE — Assessment & Plan Note (Signed)
Patient has been on Coumadin since he suffered a pulmonary embolus 5 years ago.  According to the patient his father had a Coumadin for Blood Clots in His Leg and His Mother Had Problems with Blood Clots As Well.  I recently question whether he the patient may be hypercoagulable, in view of his family history, and, if not, if he needs to be on Coumadin long-term.  I referred he is questions to the patient's PCP, Dr. Damita Dunnings.

## 2014-04-28 NOTE — Patient Instructions (Addendum)
You will be contaced by our office prior to your procedure for directions on holding your Coumadin/Warfarin.  If you do not hear from our office 1 week prior to your scheduled procedure, please call [phone] to discuss.   You have been scheduled for a colonoscopy. Please follow written instructions given to you at your visit today.  Please pick up your prep kit at the pharmacy within the next 1-3 days. If you use inhalers (even only as needed), please bring them with you on the day of your procedure. Your physician has requested that you go to www.startemmi.com and enter the access code given to you at your visit today. This web site gives a general overview about your procedure. However, you should still follow specific instructions given to you by our office regarding your preparation for the procedure.  Suprep sample kit given

## 2014-04-28 NOTE — Telephone Encounter (Signed)
  04/28/2014   RE: Ronald Hatfield DOB: 1966-02-28 MRN: [id number]   Dear  Phillip Heal Duncan,MD   We have scheduled the above patient for an endoscopic procedure. Our records show that he is on anticoagulation therapy.   Please advise as to how long the patient may come off his therapy of Coumadin prior to the procedure, which is scheduled for 06/15/2014 .  Please fax back/ or route the completed form to [facility] at [phone].   Sincerely,    Genella Mech

## 2014-04-28 NOTE — Progress Notes (Signed)
_                                                                                                                History of Present Illness:  Mr. Tangen is a pleasant 48 year old white male referred for colorectal cancer screening.  He patient has no GI complaints including change of bowel habits, abdominal pain, melena or hematochezia.  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  Apparently the patient's father had DVT and was placed on Coumadin for life anything was a brother may have been told that he is prone to blood clots.   Past Medical History  Diagnosis Date  . Attention deficit disorder without mention of hyperactivity   . Depressive disorder, not elsewhere classified   . Long term (current) use of anticoagulants   . Encounter for therapeutic drug monitoring   . Routine general medical examination at a health care facility   . Pure hypercholesterolemia   . Other abnormal glucose   . 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 Impotence of organic origin   . Overweight(278.02)   . Other pulmonary embolism and infarction     Hospitalization 09/12/08-09/16/08  . Dermatophytosis of the body   . Unspecified viral infection, in conditions classified elsewhere and of unspecified site   . RLS (restless legs syndrome)    Past Surgical History  Procedure Laterality Date  . Lipoma excision  1994    Right clavicular head  . Vasectomy  02/07/02    Dr. Council Mechanic   family history includes Alcohol abuse in his father and maternal grandfather; Alzheimer's disease in his paternal grandmother; Coronary artery disease in his maternal grandmother; Deep vein thrombosis in his father, mother, and sister; Diabetes in his maternal grandmother; Skin cancer in his mother; Stroke in his maternal grandmother. There is no history of Prostate cancer or Colon cancer. Current Outpatient Prescriptions  Medication Sig  Dispense Refill  . celecoxib (CELEBREX) 100 MG capsule Take 1 capsule (100 mg total) by mouth daily.  30 capsule  5  . cetirizine (ZYRTEC) 10 MG tablet Take 10 mg by mouth daily.      [name] CIALIS 5 MG tablet Takes one tablet by mouth once daily      . COUMADIN 3 MG tablet TAKE 1 TABLET EVERY DAY AS DIRECTED  90 tablet  0  . lisdexamfetamine (VYVANSE) 40 MG capsule Take 1 capsule (40 mg total) by mouth every morning.  30 capsule  0  . lisdexamfetamine (VYVANSE) 40 MG capsule Take 1 capsule (40 mg total) by mouth every morning.  30 capsule  0   No current facility-administered medications for this visit.   Allergies as of 04/28/2014  . (No Known Allergies)    reports that he has never smoked. He has never used smokeless tobacco. He reports that he drinks alcohol. He reports that he does not use illicit drugs.   Review of Systems: Pertinent positive and negative  review of systems were noted in the above HPI section. All other review of systems were otherwise negative.  Vital signs were reviewed in today's medical record Physical Exam: General: Well developed , well nourished, no acute distress Skin: anicteric Head: Normocephalic and atraumatic Eyes:  sclerae anicteric, EOMI Ears: Normal auditory acuity Mouth: No deformity or lesions Neck: Supple, no masses or thyromegaly Lungs: Clear throughout to auscultation Heart: Regular rate and rhythm; no murmurs, rubs or bruits Abdomen: Soft, non tender and non distended. No masses, hepatosplenomegaly or hernias noted. Normal Bowel sounds Rectal:deferred Musculoskeletal: Symmetrical with no gross deformities  Skin: No lesions on visible extremities Pulses:  Normal pulses noted Extremities: No clubbing, cyanosis, edema or deformities noted Neurological: Alert oriented x 4, grossly nonfocal Cervical Nodes:  No significant cervical adenopathy Inguinal Nodes: No significant inguinal adenopathy Psychological:  Alert and cooperative. Normal mood and  affect  See Assessment and Plan under Problem List

## 2014-04-29 NOTE — Telephone Encounter (Signed)
I called patient and discussed options.  We're going to get his hypercoagulable panel don next week.  I would not stop his coumadin before testing since he has been chronically anticoagulated.  I wouldn't risk coming of the coumadin for the panel, though it may lower his protein C and S levels on the panel.  This panel should be back in plenty of time to make decisions.   I think he'll still be on lifelong anticoagulation given his family hx (mult relatives with DVT or PE).   Given that he will likely be on anticoagulation after the procedure, I'll ask Dr. Deatra Ina what options he would be comfortable with for his procedure.   Routed to Dr. Deatra Ina for input.  Thanks to all involved.

## 2014-04-30 ENCOUNTER — Encounter: Payer: Self-pay | Admitting: Gastroenterology

## 2014-04-30 NOTE — Telephone Encounter (Signed)
Dr Deatra Ina I spoke with patient he wants to leave is colonoscopy scheduled  Its not until 12/14, He will contact Dr Carole Civil office about why he can not hold coumadin. I will leave him on schedule until I hear something different

## 2014-04-30 NOTE — Telephone Encounter (Signed)
I'll await the hypercoag panel results.

## 2014-04-30 NOTE — Telephone Encounter (Signed)
I would defer colonoscopy until his hematology workup is complete.  Should he in fact be hypercoagulable then you might consider a Lovenox bridge while holding Coumadin.  If the panel is negative this option remains or he can simply hold his Coumadin for 4-5 days prior to the procedure.  I will but things on hold until I hear from you. Thanks, Rob

## 2014-05-04 ENCOUNTER — Other Ambulatory Visit (INDEPENDENT_AMBULATORY_CARE_PROVIDER_SITE_OTHER): Payer: 59

## 2014-05-04 ENCOUNTER — Ambulatory Visit: Payer: 59

## 2014-05-04 DIAGNOSIS — I2699 Other pulmonary embolism without acute cor pulmonale: Secondary | ICD-10-CM

## 2014-05-04 LAB — PROTIME-INR
INR: 2.1 ratio — AB (ref 0.8–1.0)
Prothrombin Time: 23.2 s — ABNORMAL HIGH (ref 9.6–13.1)

## 2014-05-07 LAB — HYPERCOAGULABLE PANEL, COMPREHENSIVE
ANTITHROMB III FUNC: 103 % (ref 76–126)
Anticardiolipin IgA: 7 APL U/mL (ref <22)
Anticardiolipin IgG: 9 GPL U/mL (ref <23)
Anticardiolipin IgM: 0 MPL U/mL (ref <11)
BETA 2 GLYCO I IGG: 4 G Units (ref <20)
BETA-2-GLYCOPROTEIN I IGA: 10 A Units (ref <20)
Beta-2-Glycoprotein I IgM: 4 M Units (ref <20)
DRVVT: 40.3 secs (ref <42.9)
LUPUS ANTICOAGULANT: NOT DETECTED
PROTEIN C, TOTAL: 43 % — AB (ref 72–160)
PROTEIN S ACTIVITY: 64 % — AB (ref 69–129)
PROTEIN S TOTAL: 62 % (ref 60–150)
PTT Lupus Anticoagulant: 36.6 secs (ref 28.0–43.0)
Protein C Activity: 18 % — ABNORMAL LOW (ref 75–133)

## 2014-05-10 ENCOUNTER — Telehealth: Payer: Self-pay | Admitting: Family Medicine

## 2014-05-10 DIAGNOSIS — D6859 Other primary thrombophilia: Secondary | ICD-10-CM

## 2014-05-10 NOTE — Telephone Encounter (Signed)
His protein C level was really low on the testing and I don't think we can attribute that to just the coumadin.  A protein C deficiency would explain his family history.  I wouldn't risk taking him off the coumadin to get the test done otherwise.  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   I would prefer a lovenox bridge with the colonoscopy. Let me know what you think. Thanks.

## 2014-05-13 NOTE — Telephone Encounter (Signed)
I am okay with continuing the coumadin or the lovenox bridge for the colonoscopy.  If you prefer to continue coumadin over the lovenox bridge, then I support that.  Thanks.

## 2014-05-13 NOTE — Telephone Encounter (Signed)
Since this is a screening test and he is asymptomatic I recommend he undergo colonoscopy while continuing coumadin with the understanding that, if a polyp is seen, he will have to undergo repeat colo at some point in the near future.  If that should occur I would hold coumadin with a lovenox bridge. If you concur then I will contact the pt.

## 2014-05-14 NOTE — Telephone Encounter (Signed)
I will recommend to the patient that he undergo colonoscopy while continuing Coumadin.

## 2014-05-14 NOTE — Telephone Encounter (Signed)
Patient notified. Reluctant agreement to this plan.Voices understanding.

## 2014-06-15 ENCOUNTER — Encounter: Payer: 59 | Admitting: Gastroenterology

## 2014-06-29 ENCOUNTER — Ambulatory Visit (INDEPENDENT_AMBULATORY_CARE_PROVIDER_SITE_OTHER): Payer: 59 | Admitting: Family Medicine

## 2014-06-29 DIAGNOSIS — Z86711 Personal history of pulmonary embolism: Secondary | ICD-10-CM

## 2014-06-29 DIAGNOSIS — Z5181 Encounter for therapeutic drug level monitoring: Secondary | ICD-10-CM

## 2014-06-29 DIAGNOSIS — Z7901 Long term (current) use of anticoagulants: Secondary | ICD-10-CM

## 2014-06-29 LAB — POCT INR: INR: 1.7

## 2014-07-30 ENCOUNTER — Other Ambulatory Visit: Payer: Self-pay | Admitting: Family Medicine

## 2014-07-30 NOTE — Telephone Encounter (Signed)
Electronic refill request. Last Filled:    90 tablet 0 RF on 04/07/2014  Please advise.

## 2014-07-31 MED ORDER — LISDEXAMFETAMINE DIMESYLATE 40 MG PO CAPS: 40.0000 mg | ORAL_CAPSULE | ORAL | Status: DC

## 2014-07-31 NOTE — Telephone Encounter (Signed)
Patient advised.  Rx left at front desk for pick up. 

## 2014-07-31 NOTE — Telephone Encounter (Signed)
Printed.  Thanks.  

## 2014-07-31 NOTE — Telephone Encounter (Signed)
Sent. Thanks.   

## 2014-08-05 ENCOUNTER — Telehealth: Payer: Self-pay | Admitting: *Deleted

## 2014-08-05 NOTE — Telephone Encounter (Signed)
Judi called patient to remind him of his procedure appointment tomorrow and he wanted to know about the Coumadin.  He was concerned that if he had to stay on Coumadin during procedure and they find something that has to be addressed, he would have to come back again to have procedure.  I was unsure how to answer his question so I called and spoke to his nurse Virgina Evener, LPN.  She advised me to take a telephone note and she would call him back.  I took patient's telephone number and sent telephone note to her.

## 2014-08-05 NOTE — Telephone Encounter (Signed)
The question the patient has is about what his insurance will cover if he has to repeat the colonoscopy due to colonic polyps or other issues. I advised the patient I do not know what his insurance will cover. If he has an issue that requires a repeat procedure in a few days, weeks or years, it is no longer a screening procedure which may change what his insurance pays for. I also advised him he can call his insurance to find out. He asks for someone at this office to investigate this today. He is scheduled for his procedure tomorrow. Thanks for your help with this matter.

## 2014-08-06 ENCOUNTER — Other Ambulatory Visit: Payer: Self-pay

## 2014-08-06 ENCOUNTER — Encounter: Payer: Self-pay | Admitting: Gastroenterology

## 2014-08-06 ENCOUNTER — Ambulatory Visit (AMBULATORY_SURGERY_CENTER): Payer: 59 | Admitting: Gastroenterology

## 2014-08-06 VITALS — BP 119/82 | HR 71 | Temp 97.7°F | Resp 15 | Ht 70.0 in | Wt 238.0 lb

## 2014-08-06 DIAGNOSIS — K635 Polyp of colon: Secondary | ICD-10-CM

## 2014-08-06 DIAGNOSIS — D125 Benign neoplasm of sigmoid colon: Principal | ICD-10-CM

## 2014-08-06 DIAGNOSIS — D126 Benign neoplasm of colon, unspecified: Secondary | ICD-10-CM

## 2014-08-06 DIAGNOSIS — Z1211 Encounter for screening for malignant neoplasm of colon: Secondary | ICD-10-CM

## 2014-08-06 MED ORDER — SODIUM CHLORIDE 0.9 % IV SOLN: 500.0000 mL | INTRAVENOUS | Status: DC

## 2014-08-06 NOTE — Op Note (Addendum)
[facility]  [address]. [place], [address]   COLONOSCOPY PROCEDURE REPORT  PATIENT: Ronald, Hatfield  MR#: [id number] BIRTHDATE: [birth year] , 48  yrs. old GENDER: male ENDOSCOPIST: Inda Castle, MD REFERRED XB:[id number] Duncan, M.D. PROCEDURE DATE:  08/06/2014 PROCEDURE:   Colonoscopy with Submucosal injection, any substance First Screening Colonoscopy - Avg.  risk and is 50 yrs.  old or older Yes.  Prior Negative Screening - Now for repeat screening. N/A  History of Adenoma - Now for follow-up colonoscopy & has been > or = to 3 yrs.  N/A  Polyps Removed Today? Yes. ASA CLASS:   Class II INDICATIONS:first colonoscopy and average risk for colon cancer. MEDICATIONS: Monitored anesthesia care, Propofol 300 mg IV, and lidocaine 40 mg IV  DESCRIPTION OF PROCEDURE:   After the risks benefits and alternatives of the procedure were thoroughly explained, informed consent was obtained.  The digital rectal exam revealed no abnormalities of the rectum.   The LB [id number]  endoscope was introduced through the anus and advanced to the ileum. No adverse events experienced.   The quality of the prep was excellent using Suprep  The instrument was then slowly withdrawn as the colon was fully examined.      COLON FINDINGS: A single polyp was foundIn the proximal sigmoid colon.  A tattoo was applied.   A pedunculated polyp measuring 6 mm in size was found in the sigmoid colon. in the mid descending colon there was a 3 mm sessile polyp.  Remainder the exam was normal Retroflexed views revealed no abnormalities. The time to cecum=1 minutes 18 seconds.  Withdrawal time=8 minutes 26 seconds.  The scope was withdrawn and the procedure completed. COMPLICATIONS: There were no immediate complications.  ENDOSCOPIC IMPRESSION:  1.   Pedunculated polyp was found in the sigmoid colon 2.  sessile polyp descending colon  polyps were not removed because patient is on  Coumadin  RECOMMENDATIONS: sigmoidoscopy with patient off Coumadin for polypectomy  eSigned:  Inda Castle, MD 09/10/2014 8:35 AM Revised: 09/10/2014 8:35 AM  cc:   PATIENT NAME:  Ronald, Hatfield MR#: [id number]

## 2014-08-06 NOTE — Progress Notes (Signed)
Stable to RR 

## 2014-08-06 NOTE — Patient Instructions (Signed)

## 2014-08-06 NOTE — Progress Notes (Signed)
Called to room to assist during endoscopic procedure.  Patient ID and intended procedure confirmed with present staff. Received instructions for my participation in the procedure from the performing physician.  

## 2014-08-07 ENCOUNTER — Telehealth: Payer: Self-pay

## 2014-08-07 NOTE — Telephone Encounter (Signed)
  Follow up Call-  Call back number 08/06/2014  Post procedure Call Back phone  # [phone]  Permission to leave phone message Yes     Patient questions:  Do you have a fever, pain , or abdominal swelling? No. Pain Score  0 *  Have you tolerated food without any problems? Yes.    Have you been able to return to your normal activities? Yes.    Do you have any questions about your discharge instructions: Diet   No. Medications  No. Follow up visit  No.  Do you have questions or concerns about your Care? No.  Actions: * If pain score is 4 or above: No action needed, pain <4.  No problems per the pt. maw

## 2014-08-10 ENCOUNTER — Encounter: Payer: Self-pay | Admitting: *Deleted

## 2014-08-10 ENCOUNTER — Other Ambulatory Visit (INDEPENDENT_AMBULATORY_CARE_PROVIDER_SITE_OTHER): Payer: 59

## 2014-08-10 DIAGNOSIS — Z5181 Encounter for therapeutic drug level monitoring: Secondary | ICD-10-CM

## 2014-08-10 DIAGNOSIS — Z7901 Long term (current) use of anticoagulants: Secondary | ICD-10-CM

## 2014-08-10 DIAGNOSIS — Z86711 Personal history of pulmonary embolism: Secondary | ICD-10-CM

## 2014-08-10 LAB — POCT INR: INR: 2.1

## 2014-08-17 ENCOUNTER — Telehealth: Payer: Self-pay | Admitting: Family Medicine

## 2014-08-17 NOTE — Telephone Encounter (Signed)
I checked with pharmacy.  This is also sent to GI as a FYI.  Procedure planned for 09/10/14.  He'll need 5 days off coumadin, so his last dose of coumadin would be 09/04/14 (then off 3/5-3/9). Would use lovenox 100mg  BID, first dose in AM on 09/07/14.  Continue until PM of 09/09/14.  Both meds held for 09/10/14.  Assuming no complications, both meds restarted on 09/11/14 (once daily coumadin and BID lovenox). Would recheck INR on 09/14/14.   If INR ~2, would stop lovenox at that point.   I need to know if GI can check INR just before the procedure and if still elevated, can it be corrected prior to procedure? O/w we'll need to make other arrangements for INR the day of the procedure.   I haven't sent rx for lovenox or f/u info to patient yet.  I will when I have report/agreement back from GI.  Thanks to all involved.

## 2014-08-18 ENCOUNTER — Other Ambulatory Visit: Payer: Self-pay | Admitting: Family Medicine

## 2014-08-18 NOTE — Telephone Encounter (Signed)
Call pt.  I checked with GI about this.  Procedure planned for 09/10/14.  He'll need 5 days off coumadin, so his last dose of coumadin would be 09/04/14 (then off 3/5-3/9).  Would use lovenox 100mg  BID, first dose in AM on 09/07/14. Continue until PM of 09/09/14.  Both meds held for 09/10/14.  Assuming no complications, both meds restarted on 09/11/14 (once daily coumadin and BID lovenox).  Continue both until INR back to/near goal.  Would need recheck INR at [facility] on 09/14/14.  He'll need 6 doses of lovenox before the procedure and at least 7 (maybe 9-11 doses, depending on INR) after the procedure.  If he needs lovenox injection teaching then let me know.   I'll send the rx sent to pharmacy but please talk to me about his syringe/needle order first.   Thanks.

## 2014-08-18 NOTE — Telephone Encounter (Signed)
I would appreciate it if you would get the INR the day of the procedure, just to be on the safe side.   I'll go ahead and send the information to the patient along with the rx to the pharmacy.  Thanks.

## 2014-08-18 NOTE — Telephone Encounter (Signed)
Ronald Hatfield, thanks for your assistance.   We generally do not check the INR preprocedure with pt's on the regimen that you suggested.  If you would like [place] to do so let me know. RK

## 2014-08-19 MED ORDER — ENOXAPARIN SODIUM 100 MG/ML ~~LOC~~ SOLN: 100.0000 mg | Freq: Two times a day (BID) | SUBCUTANEOUS | Status: DC

## 2014-08-19 NOTE — Telephone Encounter (Signed)
Will do!

## 2014-08-19 NOTE — Telephone Encounter (Signed)
Spoke to Rob at [facility] and was advised that Lovenox comes in pre-filled syringes and a 1/2 needles is what is usually used.

## 2014-08-19 NOTE — Telephone Encounter (Signed)
I sent the rx and called [facility].  This should be good to go.  Thanks.

## 2014-08-19 NOTE — Telephone Encounter (Signed)
Called patient to give instructions and was advised that he is unable to write down the notes at this time. Patient stated that he is leaving for [place] in the morning and will not be able to call back until Tuesday. Patient stated that he will call for the instructions as soon as he gets back in town.

## 2014-08-20 ENCOUNTER — Encounter: Payer: Self-pay | Admitting: Gastroenterology

## 2014-08-26 ENCOUNTER — Telehealth: Payer: Self-pay | Admitting: *Deleted

## 2014-08-26 NOTE — Telephone Encounter (Signed)
"  sigmoidoscopy with patient off Coumadin for polypectomy"

## 2014-08-26 NOTE — Telephone Encounter (Signed)
===  View-only below this line===  ----- Message -----    From: Inda Castle, MD    Sent: 08/19/2014  11:09 AM      To: Oda Kilts, CMA  This patient is scheduled for sigmoidoscopy on March 10.  He actually should be scheduled for colonoscopy.  In addition, his PCP wants a stat INR prior to the procedure.  Since he is a 7:30 patient let's schedule him for later in the morning to allow [place] to get an INR.

## 2014-08-26 NOTE — Telephone Encounter (Signed)
Ronald Hatfield, Dr Deatra Ina sent this to me in a staff message. Im confused  It looks like you scheduled this as a flex he said it suppose to be a colonoscopy

## 2014-08-28 NOTE — Telephone Encounter (Signed)
Patient advised and read the instructions back to me.  Did you send the Rx for the needles also?

## 2014-08-28 NOTE — Telephone Encounter (Signed)
I checked with pharmacy.  The needles are in the rx already; it's a package deal.  Thanks.

## 2014-08-30 NOTE — Telephone Encounter (Signed)
My confusion.  He needs a sigmoidoscopy only

## 2014-08-31 ENCOUNTER — Telehealth: Payer: Self-pay | Admitting: Family Medicine

## 2014-08-31 MED ORDER — LISDEXAMFETAMINE DIMESYLATE 40 MG PO CAPS: ORAL_CAPSULE | ORAL | Status: DC

## 2014-08-31 NOTE — Telephone Encounter (Signed)
ok 

## 2014-08-31 NOTE — Telephone Encounter (Signed)
Letter printed to be signed by Dr. Damita Dunnings.  Will  Send out certified today to the patient.

## 2014-08-31 NOTE — Telephone Encounter (Signed)
Please send letter to patient, send as a certified letter.  THC positive.  No more controlled meds to be rx'd for patient.  Thanks.

## 2014-08-31 NOTE — Telephone Encounter (Signed)
Signed, thanks

## 2014-09-02 ENCOUNTER — Encounter: Payer: Self-pay | Admitting: Family Medicine

## 2014-09-10 ENCOUNTER — Encounter: Payer: Self-pay | Admitting: Gastroenterology

## 2014-09-10 ENCOUNTER — Ambulatory Visit (AMBULATORY_SURGERY_CENTER): Payer: 59 | Admitting: Gastroenterology

## 2014-09-10 VITALS — BP 121/84 | HR 66 | Temp 97.0°F | Resp 16 | Ht 70.0 in | Wt 238.0 lb

## 2014-09-10 DIAGNOSIS — Z1211 Encounter for screening for malignant neoplasm of colon: Secondary | ICD-10-CM | POA: Diagnosis present

## 2014-09-10 DIAGNOSIS — D125 Benign neoplasm of sigmoid colon: Secondary | ICD-10-CM

## 2014-09-10 DIAGNOSIS — D124 Benign neoplasm of descending colon: Secondary | ICD-10-CM

## 2014-09-10 MED ORDER — SODIUM CHLORIDE 0.9 % IV SOLN: 500.0000 mL | INTRAVENOUS | Status: DC

## 2014-09-10 NOTE — Progress Notes (Signed)
Stable to RR 

## 2014-09-10 NOTE — Patient Instructions (Addendum)

## 2014-09-10 NOTE — Progress Notes (Signed)
Called to room to assist during endoscopic procedure.  Patient ID and intended procedure confirmed with present staff. Received instructions for my participation in the procedure from the performing physician.  

## 2014-09-10 NOTE — Op Note (Addendum)
[facility]  [address]. [place], [address]   COLONOSCOPY PROCEDURE REPORT  PATIENT: Ronald Hatfield, Ronald Hatfield  MR#: [id number] BIRTHDATE: 11-20-1965 , 48  yrs. old GENDER: male ENDOSCOPIST: Inda Castle, MD REFERRED QI:[id number] Duncan, M.D. PROCEDURE DATE:  09/10/2014 PROCEDURE:   Colonoscopy with snare polypectomy First Screening Colonoscopy - Avg.  risk and is 50 yrs.  old or older - No.  Prior Negative Screening - Now for repeat screening. N/A  History of Adenoma - Now for follow-up colonoscopy & has been > or = to 3 yrs.  No.  It has been less than 3 yrs since last colonoscopy.  Medical reason.  Polyps Removed Today ASA CLASS:   Class II INDICATIONS:Excision of colonic polyp  Recent colonoscopy for screening while patient was on Coumadin demonstrated 2 left colon polyps - high-risk for pulmonary emboli. MEDICATIONS: Monitored anesthesia care, Propofol 400 mg IV, and Lidocaine 40 mg IV  DESCRIPTION OF PROCEDURE:   After the risks benefits and alternatives of the procedure were thoroughly explained, informed consent was obtained.  The digital rectal exam revealed no abnormalities of the rectum.   The LB [id number]  endoscope was introduced through the anus and advanced to the cecum, which was identified by both the appendix and ileocecal valve. No adverse events experienced.   The quality of the prep was excellent. (Fleets Enemas was used)  The instrument was then slowly withdrawn as the colon was fully examined.      COLON FINDINGS: A sessile polyp measuring 3 mm in size was found in the descending colon.  A polypectomy was performed using snare cautery.  The resection was complete, the polyp tissue was completely retrieved and sent to histology.   A semi-pedunculated polyp measuring 10 mm in size was found in the proximal sigmoid colon.  A polypectomy was performed using snare cautery.  The resection was complete, the polyp tissue was completely  retrieved and sent to histology.   The examination was otherwise normal. Retroflexed views revealed no abnormalities. The time to cecum = Withdrawal time = 7 minutes 47 seconds   The scope was withdrawn and the procedure completed. COMPLICATIONS: There were no immediate complications.  ENDOSCOPIC IMPRESSION: 1.   Sessile polyp was found in the descending colon; polypectomy was performed using snare cautery 2.   Semi-pedunculated polyp was found in the proximal sigmoid colon; polypectomy was performed using snare cautery 3.   The examination was otherwise normal  RECOMMENDATIONS: Resume Lovenox and Coumadin in a.m. If polyps are adenomatous (precancerous) recommend follow-up colonoscopy in 3 years  eSigned:  Inda Castle, MD 09/10/2014 8:33 AM Revised: 09/10/2014 8:33 AM  cc:   PATIENT NAME:  [name], [name] MR#: [id number]

## 2014-09-11 ENCOUNTER — Telehealth: Payer: Self-pay | Admitting: *Deleted

## 2014-09-11 NOTE — Telephone Encounter (Signed)
  Follow up Call-  Call back number 09/10/2014 08/06/2014  Post procedure Call Back phone  # [phone]  Permission to leave phone message Yes Yes     Patient questions:  Do you have a fever, pain , or abdominal swelling? No. Pain Score  0 *  Have you tolerated food without any problems? Yes.    Have you been able to return to your normal activities? Yes.    Do you have any questions about your discharge instructions: Diet   No. Medications  No. Follow up visit  No.  Do you have questions or concerns about your Care? No.  Actions: * If pain score is 4 or above: No action needed, pain <4.

## 2014-09-17 ENCOUNTER — Encounter: Payer: Self-pay | Admitting: Gastroenterology

## 2014-09-22 ENCOUNTER — Other Ambulatory Visit (INDEPENDENT_AMBULATORY_CARE_PROVIDER_SITE_OTHER): Payer: 59

## 2014-09-22 DIAGNOSIS — Z86711 Personal history of pulmonary embolism: Secondary | ICD-10-CM

## 2014-09-22 DIAGNOSIS — Z5181 Encounter for therapeutic drug level monitoring: Secondary | ICD-10-CM

## 2014-09-22 DIAGNOSIS — Z7901 Long term (current) use of anticoagulants: Secondary | ICD-10-CM

## 2014-09-22 LAB — POCT INR: INR: 1.5

## 2014-09-28 ENCOUNTER — Telehealth: Payer: Self-pay | Admitting: Family Medicine

## 2014-09-28 DIAGNOSIS — F988 Other specified behavioral and emotional disorders with onset usually occurring in childhood and adolescence: Secondary | ICD-10-CM

## 2014-09-28 NOTE — Telephone Encounter (Signed)
Noted, thanks!

## 2014-09-28 NOTE — Telephone Encounter (Signed)
Certified Letter signed by Elsie Stain MD on 08/31/14  returned as undeliverable, unclaimed, return to sender after three attempts by [employer]. Letter placed in another envelope and resent as 1st class mail which does not require a signature. DAJ

## 2014-10-06 ENCOUNTER — Other Ambulatory Visit (INDEPENDENT_AMBULATORY_CARE_PROVIDER_SITE_OTHER): Payer: 59

## 2014-10-06 DIAGNOSIS — Z7901 Long term (current) use of anticoagulants: Secondary | ICD-10-CM

## 2014-10-06 DIAGNOSIS — Z86711 Personal history of pulmonary embolism: Secondary | ICD-10-CM

## 2014-10-06 DIAGNOSIS — Z5181 Encounter for therapeutic drug level monitoring: Secondary | ICD-10-CM | POA: Diagnosis not present

## 2014-10-06 LAB — POCT INR

## 2014-11-05 ENCOUNTER — Telehealth: Payer: Self-pay

## 2014-11-05 ENCOUNTER — Other Ambulatory Visit (INDEPENDENT_AMBULATORY_CARE_PROVIDER_SITE_OTHER): Payer: 59

## 2014-11-05 DIAGNOSIS — Z5181 Encounter for therapeutic drug level monitoring: Secondary | ICD-10-CM | POA: Diagnosis not present

## 2014-11-05 DIAGNOSIS — Z86711 Personal history of pulmonary embolism: Secondary | ICD-10-CM

## 2014-11-05 DIAGNOSIS — Z7901 Long term (current) use of anticoagulants: Secondary | ICD-10-CM | POA: Diagnosis not present

## 2014-11-05 LAB — POCT INR: INR: 1.9

## 2014-11-05 NOTE — Telephone Encounter (Signed)
Pt left note requesting rx for Vyvanse. See 08/31/14 and 09/28/2014 phone note and letter dated 08/31/14. Left v/m requesting cb from pt.

## 2014-11-06 NOTE — Telephone Encounter (Signed)
I spoke with pt and he said he did not think he received any letter from Dr Damita Dunnings.Pt advised that Dr Damita Dunnings cannot prescribe Vyvanse or other controlled substance. Pt said OK and does he still have his appt scheduled for 12/2014. Confirmed with pt he does have CPX scheduled on 12/03/14 at 9:45 AM. Pt said thank you and ended call. Sending to Dr Damita Dunnings as Juluis Rainier.

## 2014-11-06 NOTE — Telephone Encounter (Signed)
Noted, thanks!

## 2014-12-03 ENCOUNTER — Encounter: Payer: Self-pay | Admitting: Family Medicine

## 2014-12-03 ENCOUNTER — Ambulatory Visit (INDEPENDENT_AMBULATORY_CARE_PROVIDER_SITE_OTHER): Payer: 59 | Admitting: Family Medicine

## 2014-12-03 VITALS — BP 104/70 | HR 68 | Temp 98.3°F | Ht 70.0 in | Wt 218.5 lb

## 2014-12-03 DIAGNOSIS — Z79899 Other long term (current) drug therapy: Secondary | ICD-10-CM | POA: Diagnosis not present

## 2014-12-03 DIAGNOSIS — Z86711 Personal history of pulmonary embolism: Secondary | ICD-10-CM | POA: Diagnosis not present

## 2014-12-03 DIAGNOSIS — Z5181 Encounter for therapeutic drug level monitoring: Secondary | ICD-10-CM | POA: Diagnosis not present

## 2014-12-03 DIAGNOSIS — Z0000 Encounter for general adult medical examination without abnormal findings: Secondary | ICD-10-CM | POA: Diagnosis not present

## 2014-12-03 DIAGNOSIS — E785 Hyperlipidemia, unspecified: Secondary | ICD-10-CM

## 2014-12-03 LAB — POCT INR: INR: 1.4

## 2014-12-03 NOTE — Patient Instructions (Signed)
Go to the lab on the way out.  We'll contact you with your lab report. Get an INR today.  Get other labs when fasting at your next INR check.  Keep working on your diet and weight.  Take care.

## 2014-12-03 NOTE — Progress Notes (Signed)
Pre visit review using our clinic review tool, if applicable. No additional management support is needed unless otherwise documented below in the visit note.  CPE- See plan.  Routine anticipatory guidance given to patient.  See health maintenance. Tetanus 2013 Flu usually done yearly PNA and shingles not due.   Living will d/w pt.  Wife designated if patient were incapacitated.   Colonoscopy 2016 Prostate cancer screening and PSA options (with potential risks and benefits of testing vs not testing) were discussed along with recent recs/guidelines.  He declined testing PSA at this point. Diet and exercise, working weight loss.   Due for labs, can get done fasting with next INR.   Prev UDS d/w pt.  Off vyvanse and doing well with changes in diet and exercise.   H/o PE, due for INR today.  No bleeding, bruising, compliant with meds. 3mg  a day, 21mg  per week.   PMH and SH reviewed  Meds, vitals, and allergies reviewed.   ROS: See HPI.  Otherwise negative.    GEN: nad, alert and oriented HEENT: mucous membranes moist NECK: supple w/o LA CV: rrr. PULM: ctab, no inc wob ABD: soft, +bs EXT: no edema SKIN: no acute rash

## 2014-12-04 NOTE — Assessment & Plan Note (Signed)
Routine anticipatory guidance given to patient. See health maintenance.  Tetanus 2013  Flu usually done yearly  PNA and shingles not due.  Living will d/w pt. Wife designated if patient were incapacitated.  Colonoscopy 2016  Prostate cancer screening and PSA options (with potential risks and benefits of testing vs not testing) were discussed along with recent recs/guidelines. He declined testing PSA at this point.  Diet and exercise, working weight loss.  Due for labs, can get done fasting with next INR.  See notes on INR today.

## 2014-12-17 ENCOUNTER — Other Ambulatory Visit: Payer: 59

## 2014-12-17 ENCOUNTER — Other Ambulatory Visit (INDEPENDENT_AMBULATORY_CARE_PROVIDER_SITE_OTHER): Payer: 59

## 2014-12-17 DIAGNOSIS — Z5181 Encounter for therapeutic drug level monitoring: Secondary | ICD-10-CM

## 2014-12-17 DIAGNOSIS — Z86711 Personal history of pulmonary embolism: Secondary | ICD-10-CM

## 2014-12-17 DIAGNOSIS — Z79899 Other long term (current) drug therapy: Secondary | ICD-10-CM

## 2014-12-17 DIAGNOSIS — E785 Hyperlipidemia, unspecified: Secondary | ICD-10-CM

## 2014-12-17 LAB — CBC WITH DIFFERENTIAL/PLATELET
BASOS PCT: 0.9 % (ref 0.0–3.0)
Basophils Absolute: 0 10*3/uL (ref 0.0–0.1)
Eosinophils Absolute: 0.3 10*3/uL (ref 0.0–0.7)
Eosinophils Relative: 6.6 % — ABNORMAL HIGH (ref 0.0–5.0)
HCT: 44.9 % (ref 39.0–52.0)
HEMOGLOBIN: 15.4 g/dL (ref 13.0–17.0)
LYMPHS PCT: 29.3 % (ref 12.0–46.0)
Lymphs Abs: 1.2 10*3/uL (ref 0.7–4.0)
MCHC: 34.2 g/dL (ref 30.0–36.0)
MCV: 88.4 fl (ref 78.0–100.0)
MONOS PCT: 9.6 % (ref 3.0–12.0)
Monocytes Absolute: 0.4 10*3/uL (ref 0.1–1.0)
Neutro Abs: 2.3 10*3/uL (ref 1.4–7.7)
Neutrophils Relative %: 53.6 % (ref 43.0–77.0)
Platelets: 228 10*3/uL (ref 150.0–400.0)
RBC: 5.08 Mil/uL (ref 4.22–5.81)
RDW: 12.9 % (ref 11.5–15.5)
WBC: 4.2 10*3/uL (ref 4.0–10.5)

## 2014-12-17 LAB — COMPREHENSIVE METABOLIC PANEL
ALT: 39 U/L (ref 0–53)
AST: 29 U/L (ref 0–37)
Albumin: 3.9 g/dL (ref 3.5–5.2)
Alkaline Phosphatase: 37 U/L — ABNORMAL LOW (ref 39–117)
BUN: 13 mg/dL (ref 6–23)
CALCIUM: 9.3 mg/dL (ref 8.4–10.5)
CHLORIDE: 103 meq/L (ref 96–112)
CO2: 30 meq/L (ref 19–32)
Creatinine, Ser: 1.2 mg/dL (ref 0.40–1.50)
GFR: 67.77 mL/min (ref >60.00)
GLUCOSE: 101 mg/dL — AB (ref 70–99)
Potassium: 4.1 mEq/L (ref 3.5–5.1)
Sodium: 138 mEq/L (ref 135–145)
Total Bilirubin: 0.8 mg/dL (ref 0.2–1.2)
Total Protein: 6.2 g/dL (ref 6.0–8.3)

## 2014-12-17 LAB — POCT INR: INR: 1.5

## 2014-12-17 LAB — LIPID PANEL
CHOL/HDL RATIO: 4
Cholesterol: 130 mg/dL (ref 0–200)
HDL: 36.3 mg/dL — AB (ref >39.00)
LDL Cholesterol: 78 mg/dL (ref 0–99)
NonHDL: 93.7
Triglycerides: 81 mg/dL (ref 0.0–149.0)
VLDL: 16.2 mg/dL (ref 0.0–40.0)

## 2014-12-17 NOTE — Addendum Note (Signed)
Addended by: Ellamae Sia on: 12/17/2014 10:08 AM   Modules accepted: Orders

## 2014-12-31 ENCOUNTER — Encounter: Payer: Self-pay | Admitting: Family Medicine

## 2015-01-07 ENCOUNTER — Telehealth: Payer: Self-pay | Admitting: Family Medicine

## 2015-01-07 ENCOUNTER — Ambulatory Visit: Payer: 59

## 2015-01-07 ENCOUNTER — Other Ambulatory Visit: Payer: Self-pay | Admitting: Family Medicine

## 2015-01-07 ENCOUNTER — Encounter: Payer: Self-pay | Admitting: *Deleted

## 2015-01-07 ENCOUNTER — Ambulatory Visit (INDEPENDENT_AMBULATORY_CARE_PROVIDER_SITE_OTHER): Payer: 59 | Admitting: *Deleted

## 2015-01-07 DIAGNOSIS — Z86711 Personal history of pulmonary embolism: Secondary | ICD-10-CM | POA: Diagnosis not present

## 2015-01-07 DIAGNOSIS — Z5181 Encounter for therapeutic drug level monitoring: Secondary | ICD-10-CM | POA: Diagnosis not present

## 2015-01-07 LAB — POCT INR: INR: 3.1

## 2015-01-07 NOTE — Telephone Encounter (Signed)
Patient did not come in for their appointment today for pt inr.  Please let me know if patient needs to be contacted immediately for follow up or no follow up needed.

## 2015-01-07 NOTE — Telephone Encounter (Signed)
Noted  

## 2015-01-07 NOTE — Telephone Encounter (Signed)
Please call pt.  I checked with mult sources.   I would continue coumadin and not change to another agent.  With protein C deficiency, he is likely better off on coumadin.  There are reports of failure of anticoagulation on other agents in patients with protein C deficiency.   The other agents don't have the same amount of data/outcomes compared to coumadin and don't have the same indications/approval.   If he wants a second opinion, then we can refer him over to hematology for their input.  I would expect them to rec continued coumadin use.  Thanks.

## 2015-01-07 NOTE — Progress Notes (Signed)
Pre visit review using our clinic review tool, if applicable. No additional management support is needed unless otherwise documented below in the visit note. 

## 2015-01-07 NOTE — Telephone Encounter (Signed)
Disregard, pt called and rescheduled

## 2015-01-07 NOTE — Telephone Encounter (Signed)
Patient advised. And letter mailed.

## 2015-02-08 ENCOUNTER — Telehealth: Payer: Self-pay | Admitting: Family Medicine

## 2015-02-08 ENCOUNTER — Ambulatory Visit: Payer: 59

## 2015-02-08 NOTE — Telephone Encounter (Signed)
Patient did not come in for their appointment today for PT INR.  Please let me know if patient needs to be contacted immediately for follow up or no follow up needed. °

## 2015-02-09 NOTE — Telephone Encounter (Signed)
Patient needs reschedule this week.  Left message for patient to return call.

## 2015-02-09 NOTE — Telephone Encounter (Signed)
Appointment rescheduled for 02/11/15.

## 2015-02-11 ENCOUNTER — Ambulatory Visit: Payer: 59

## 2015-02-11 NOTE — Telephone Encounter (Signed)
Patient missed 02/11/15 appointment.  Spoke with patient.  Appointment rescheduled for 02/15/15.

## 2015-02-15 ENCOUNTER — Ambulatory Visit (INDEPENDENT_AMBULATORY_CARE_PROVIDER_SITE_OTHER): Payer: 59 | Admitting: *Deleted

## 2015-02-15 ENCOUNTER — Ambulatory Visit: Payer: 59

## 2015-02-15 DIAGNOSIS — Z5181 Encounter for therapeutic drug level monitoring: Secondary | ICD-10-CM

## 2015-02-15 DIAGNOSIS — Z86711 Personal history of pulmonary embolism: Secondary | ICD-10-CM | POA: Diagnosis not present

## 2015-02-15 LAB — POCT INR: INR: 3.8

## 2015-02-15 NOTE — Progress Notes (Signed)
Pre visit review using our clinic review tool, if applicable. No additional management support is needed unless otherwise documented below in the visit note. 

## 2015-02-25 ENCOUNTER — Other Ambulatory Visit: Payer: Self-pay | Admitting: Family Medicine

## 2015-03-04 ENCOUNTER — Telehealth: Payer: Self-pay | Admitting: *Deleted

## 2015-03-04 ENCOUNTER — Ambulatory Visit (INDEPENDENT_AMBULATORY_CARE_PROVIDER_SITE_OTHER): Payer: 59 | Admitting: *Deleted

## 2015-03-04 ENCOUNTER — Ambulatory Visit: Payer: 59

## 2015-03-04 DIAGNOSIS — Z5181 Encounter for therapeutic drug level monitoring: Secondary | ICD-10-CM

## 2015-03-04 DIAGNOSIS — Z86711 Personal history of pulmonary embolism: Secondary | ICD-10-CM

## 2015-03-04 DIAGNOSIS — Z79899 Other long term (current) drug therapy: Secondary | ICD-10-CM

## 2015-03-04 LAB — POCT INR: INR: 3.1

## 2015-03-04 NOTE — Progress Notes (Signed)
Pre visit review using our clinic review tool, if applicable. No additional management support is needed unless otherwise documented below in the visit note. 

## 2015-03-04 NOTE — Telephone Encounter (Signed)
Erroneous encounter

## 2015-03-29 ENCOUNTER — Ambulatory Visit (INDEPENDENT_AMBULATORY_CARE_PROVIDER_SITE_OTHER): Payer: 59 | Admitting: *Deleted

## 2015-03-29 DIAGNOSIS — Z79899 Other long term (current) drug therapy: Secondary | ICD-10-CM | POA: Diagnosis not present

## 2015-03-29 DIAGNOSIS — Z86711 Personal history of pulmonary embolism: Secondary | ICD-10-CM

## 2015-03-29 DIAGNOSIS — Z23 Encounter for immunization: Secondary | ICD-10-CM | POA: Diagnosis not present

## 2015-03-29 DIAGNOSIS — Z5181 Encounter for therapeutic drug level monitoring: Secondary | ICD-10-CM

## 2015-03-29 LAB — POCT INR: INR: 2.1

## 2015-03-29 NOTE — Progress Notes (Signed)
Pre visit review using our clinic review tool, if applicable. No additional management support is needed unless otherwise documented below in the visit note. 

## 2015-05-03 ENCOUNTER — Ambulatory Visit: Payer: 59

## 2015-05-06 ENCOUNTER — Ambulatory Visit: Payer: 59

## 2015-05-13 ENCOUNTER — Ambulatory Visit (INDEPENDENT_AMBULATORY_CARE_PROVIDER_SITE_OTHER): Payer: 59 | Admitting: *Deleted

## 2015-05-13 ENCOUNTER — Telehealth: Payer: Self-pay | Admitting: *Deleted

## 2015-05-13 ENCOUNTER — Ambulatory Visit: Payer: 59

## 2015-05-13 DIAGNOSIS — Z86711 Personal history of pulmonary embolism: Secondary | ICD-10-CM

## 2015-05-13 DIAGNOSIS — Z79899 Other long term (current) drug therapy: Secondary | ICD-10-CM

## 2015-05-13 DIAGNOSIS — Z5181 Encounter for therapeutic drug level monitoring: Secondary | ICD-10-CM

## 2015-05-13 LAB — POCT INR: INR: 1.5

## 2015-05-13 NOTE — Telephone Encounter (Signed)
Patient was in the office this afternoon for Coumadin clinic.  He asked to have his blood pressure checked.  He reports recent episodes of dizziness and light-headedness with no passing out.  He has been on a low-calorie diet and is consuming a significantly greater amount of fluids recently.  Blood pressure was checked in his right arm while sitting: 90/58.  Patient denies any symptoms at this time.   I recommended ov to evaluate low BP and recent symptoms.  Patient declines at this time.  He feels the low BP is likely related to his recent diet changes.  Patient states he will contact our office for an appointment if symptoms do not improve.  Advised patient to monitor BP at home and update [place] with any changes.  Patient verbalized understanding and left without seeing a provider.

## 2015-05-13 NOTE — Telephone Encounter (Signed)
Noted. Not on any antihypertensives. 20lb weight loss noted. To PCP as fyi. INR 1.5. Pt was not tachycardic per Barnett Applebaum. Wt Readings from Last 3 Encounters:  12/03/14 218 lb 8 oz (99.111 kg)  09/10/14 238 lb (107.956 kg)  08/06/14 238 lb (107.956 kg)

## 2015-05-13 NOTE — Progress Notes (Signed)
Pre visit review using our clinic review tool, if applicable. No additional management support is needed unless otherwise documented below in the visit note. 

## 2015-05-14 NOTE — Telephone Encounter (Signed)
Agree with OV.  He isn't on any antihypertensives per the EMR.

## 2015-06-03 ENCOUNTER — Ambulatory Visit (INDEPENDENT_AMBULATORY_CARE_PROVIDER_SITE_OTHER): Payer: 59 | Admitting: *Deleted

## 2015-06-03 DIAGNOSIS — Z79899 Other long term (current) drug therapy: Secondary | ICD-10-CM | POA: Diagnosis not present

## 2015-06-03 DIAGNOSIS — Z86711 Personal history of pulmonary embolism: Secondary | ICD-10-CM | POA: Diagnosis not present

## 2015-06-03 DIAGNOSIS — Z5181 Encounter for therapeutic drug level monitoring: Secondary | ICD-10-CM

## 2015-06-03 LAB — POCT INR: INR: 1.4

## 2015-06-03 NOTE — Progress Notes (Signed)
Pre visit review using our clinic review tool, if applicable. No additional management support is needed unless otherwise documented below in the visit note. 

## 2015-06-10 ENCOUNTER — Other Ambulatory Visit: Payer: Self-pay | Admitting: *Deleted

## 2015-06-10 NOTE — Telephone Encounter (Signed)
Faxed refill request. Last Filled:    120 tablet 0 03/01/2015  Please advise.

## 2015-06-11 ENCOUNTER — Other Ambulatory Visit: Payer: Self-pay | Admitting: *Deleted

## 2015-06-11 MED ORDER — COUMADIN 3 MG PO TABS: ORAL_TABLET | ORAL | Status: DC

## 2015-07-08 ENCOUNTER — Ambulatory Visit (INDEPENDENT_AMBULATORY_CARE_PROVIDER_SITE_OTHER): Payer: 59 | Admitting: *Deleted

## 2015-07-08 ENCOUNTER — Ambulatory Visit: Payer: 59

## 2015-07-08 DIAGNOSIS — Z86711 Personal history of pulmonary embolism: Secondary | ICD-10-CM | POA: Diagnosis not present

## 2015-07-08 DIAGNOSIS — Z79899 Other long term (current) drug therapy: Secondary | ICD-10-CM

## 2015-07-08 DIAGNOSIS — Z5181 Encounter for therapeutic drug level monitoring: Secondary | ICD-10-CM | POA: Diagnosis not present

## 2015-07-08 LAB — POCT INR: INR: 2.7

## 2015-07-08 NOTE — Progress Notes (Signed)
Pre visit review using our clinic review tool, if applicable. No additional management support is needed unless otherwise documented below in the visit note. 

## 2015-07-22 ENCOUNTER — Ambulatory Visit (INDEPENDENT_AMBULATORY_CARE_PROVIDER_SITE_OTHER): Payer: 59 | Admitting: Obstetrics and Gynecology

## 2015-07-22 ENCOUNTER — Encounter: Payer: Self-pay | Admitting: Obstetrics and Gynecology

## 2015-07-22 VITALS — BP 114/80 | HR 73 | Resp 16 | Ht 70.0 in | Wt 214.1 lb

## 2015-07-22 DIAGNOSIS — N400 Enlarged prostate without lower urinary tract symptoms: Secondary | ICD-10-CM

## 2015-07-22 LAB — BLADDER SCAN AMB NON-IMAGING: Scan Result: 47

## 2015-07-22 LAB — PR COMPLEX UROFLOWMETRY: Scan Result: 47

## 2015-07-22 NOTE — Progress Notes (Signed)
07/22/2015 11:20 AM   Ronald Hatfield 06-Jun-1966 [id number]  Referring provider: Tonia Ghent, MD [address] [place], [place] [address]  Chief Complaint  Patient presents with  . Benign Prostatic Hypertrophy  . Follow-up    1 yr    HPI: Patient is a 50 year old male with history of erectile dysfunction, low testosterone and BPH presenting today for his yearly follow-up. He has been taking once daily Cialis over the last year. He reports that his urinary symptoms are minimal with only occasional hesitancy with his first morning void.he feels that he may not need a daily medication for his urinary symptoms. He does report that he has been experiencing difficulty maintaining erections and is not satisfied with . He is previously taking Levitra for this as needed.  Significant history of PE in 2010, currently taking Coumadin, and ADD for which he takes daily Vyvanse.   [place]: Father- blood clots  02/20/14 PSA-0.66 Testosterone-254 Patient is not a candidate for testosterone replacement due to history of blood clots.   PMH: Past Medical History  Diagnosis Date  . Attention deficit disorder without mention of hyperactivity   . Depressive disorder, not elsewhere classified   . Long term (current) use of anticoagulants   . Encounter for therapeutic drug monitoring   . Routine general medical examination at a health care facility   . Pure hypercholesterolemia   . Other abnormal glucose   . 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 Impotence of organic origin   . Overweight(278.02)   . Other pulmonary embolism and infarction     Hospitalization 09/12/08-09/16/08  . Dermatophytosis of the body   . Unspecified viral infection, in conditions classified elsewhere and of unspecified site   . RLS (restless legs syndrome)     Surgical History: Past Surgical History  Procedure Laterality Date  . Lipoma excision  1994    Right clavicular head   . Vasectomy  02/07/02    Dr. Council Mechanic    Home Medications:    Medication List       This list is accurate as of: 07/22/15 11:20 AM.  Always use your most recent med list.               CELEBREX 200 MG capsule  Generic drug:  celecoxib  TAKE 1 CAPSULE ONCE DAILY WITH FOOD     cetirizine 10 MG tablet  Commonly known as:  ZYRTEC  Take 10 mg by mouth daily.     CIALIS 5 MG tablet  Generic drug:  tadalafil  Takes one tablet by mouth once daily     COUMADIN 3 MG tablet  Generic drug:  warfarin  Take as directed by anti-coagulation clinic        Allergies: No Known Allergies  Family History: Family History  Problem Relation Age of Onset  . Alcohol abuse Father   . Deep vein thrombosis Father     h/o phlebitis  . Deep vein thrombosis Mother   . Skin cancer Mother   . Alzheimer's disease Paternal Grandmother   . Alcohol abuse Maternal Grandfather   . Diabetes Maternal Grandmother   . Coronary artery disease Maternal Grandmother   . Stroke Maternal Grandmother   . Prostate cancer Neg Hx   . Colon cancer Neg Hx   . Deep vein thrombosis Sister     Social History:  reports that he has never smoked. He has never used smokeless tobacco. He reports that he  drinks alcohol. He reports that he does not use illicit drugs.  ROS: UROLOGY Frequent Urination?: No Hard to postpone urination?: No Burning/pain with urination?: No Get up at night to urinate?: No Leakage of urine?: No Urine stream starts and stops?: No Trouble starting stream?: No Do you have to strain to urinate?: No Blood in urine?: No Urinary tract infection?: No Sexually transmitted disease?: No Injury to kidneys or bladder?: No Painful intercourse?: No Weak stream?: No Erection problems?: Yes Penile pain?: No  Gastrointestinal Nausea?: No Vomiting?: No Indigestion/heartburn?: No Diarrhea?: No Constipation?: No  Constitutional Fever: No Night sweats?: No Weight loss?: No Fatigue?:  No  Skin Skin rash/lesions?: No Itching?: No  Eyes Blurred vision?: No Double vision?: No  Ears/Nose/Throat Sore throat?: No Sinus problems?: No  Hematologic/Lymphatic Swollen glands?: No Easy bruising?: No  Cardiovascular Leg swelling?: No Chest pain?: No  Respiratory Cough?: No Shortness of breath?: No  Endocrine Excessive thirst?: No  Musculoskeletal Back pain?: No Joint pain?: No  Neurological Headaches?: No Dizziness?: No  Psychologic Depression?: No Anxiety?: No  Physical Exam: BP 114/80 mmHg  Pulse 73  Resp 16  Ht 5\' 10"  (1.778 m)  Wt 214 lb 1.6 oz (97.115 kg)  BMI 30.72 kg/m2  Constitutional:  Alert and oriented, No acute distress. HEENT: [place] AT, moist mucus membranes.  Trachea midline, no masses. Cardiovascular: No clubbing, cyanosis, or edema. Respiratory: Normal respiratory effort, no increased work of breathing. GI: Abdomen is soft, nontender, nondistended, no abdominal masses GU: No CVA tenderness.  DRE: +2-3 smooth, no nodules, nontender Skin: No rashes, bruises or suspicious lesions. Lymph: No cervical or inguinal adenopathy. Neurologic: Grossly intact, no focal deficits, moving all 4 extremities. Psychiatric: Normal mood and affect.  Laboratory Data:   Urinalysis Results for orders placed or performed in visit on 07/22/15  PR COMPLEX UROFLOWMETRY  Result Value Ref Range   Scan Result 47 mL   BLADDER SCAN AMB NON-IMAGING  Result Value Ref Range   Scan Result 47 mL     Pertinent Imaging:   Assessment & Plan:    1. BPH (benign prostatic hyperplasia)-  Patient instructed to discontinue once daily Cialis and monitor his urinary symptoms. We will prescribe Levitra for him to take as needed for erectile dysfunction. He will follow up in 1 month to reassess his symptoms.  - BLADDER SCAN AMB NON-IMAGING - PR COMPLEX UROFLOWMETRY- insufficient voided volume  2.  Prostate cancer screening- DRE remarkble for an enlarged prostate and  PSA checked today  Return in about 1 month (around 08/22/2015) for recheck ED and BPH.  These notes generated with voice recognition software. I apologize for typographical errors.  Herbert Moors, [facility] [address] [place], [place] [address] [phone]

## 2015-07-22 NOTE — Progress Notes (Signed)
Uroflow  Peak Flow: 7.22ml Average Flow: 5.33ml Voided Volume: 66.68ml Voiding Time: 14.5sec Flow Time: 11.8sec Time to Peak Flow: 2.6sec  PVR Volume: 47 mL

## 2015-07-23 ENCOUNTER — Telehealth: Payer: Self-pay

## 2015-07-23 LAB — PSA TOTAL (REFLEX TO FREE): Prostate Specific Ag, Serum: 0.9 ng/mL (ref 0.0–4.0)

## 2015-07-23 NOTE — Telephone Encounter (Signed)
-----   Message from Roda Shutters, [place] sent at 07/23/2015  8:25 AM EST ----- Please notify patient that his PSA was normal at 0.9. He needs to be rechecked in one year with his annual screening. Thanks

## 2015-07-23 NOTE — Telephone Encounter (Signed)
Spoke with pt in reference to PSA results and annual exams. Pt voiced understanding.

## 2015-07-28 ENCOUNTER — Telehealth: Payer: Self-pay | Admitting: Obstetrics and Gynecology

## 2015-07-28 MED ORDER — VARDENAFIL HCL 10 MG PO TABS: ORAL_TABLET | ORAL | Status: DC

## 2015-07-28 NOTE — Telephone Encounter (Signed)
Pt called and left VM.  States he was prescribed Levitra by Ria Comment on his 1/19 visit.  Pt has checked with his pharmacy twice, [facility], and nothing has been sent in.  Please send his prescription in today and call the patient to let him know this has been done.

## 2015-07-28 NOTE — Telephone Encounter (Signed)
Please notify the prescription has been sent to patient's pharmacy.

## 2015-07-29 NOTE — Telephone Encounter (Signed)
Spoke with pt and made aware medication has been sent to pharmacy. Pt voiced understanding.

## 2015-08-05 ENCOUNTER — Ambulatory Visit: Payer: 59

## 2015-08-05 ENCOUNTER — Ambulatory Visit (INDEPENDENT_AMBULATORY_CARE_PROVIDER_SITE_OTHER): Payer: 59 | Admitting: *Deleted

## 2015-08-05 DIAGNOSIS — Z79899 Other long term (current) drug therapy: Secondary | ICD-10-CM

## 2015-08-05 DIAGNOSIS — Z5181 Encounter for therapeutic drug level monitoring: Secondary | ICD-10-CM | POA: Diagnosis not present

## 2015-08-05 DIAGNOSIS — Z86711 Personal history of pulmonary embolism: Secondary | ICD-10-CM

## 2015-08-05 LAB — POCT INR: INR: 2.5

## 2015-08-05 NOTE — Progress Notes (Signed)
Pre visit review using our clinic review tool, if applicable. No additional management support is needed unless otherwise documented below in the visit note. 

## 2015-08-05 NOTE — Progress Notes (Signed)
INR remains therapeutic.  No change in therapy.

## 2015-08-12 ENCOUNTER — Other Ambulatory Visit: Payer: Self-pay | Admitting: Obstetrics and Gynecology

## 2015-08-12 DIAGNOSIS — N400 Enlarged prostate without lower urinary tract symptoms: Secondary | ICD-10-CM

## 2015-09-02 ENCOUNTER — Ambulatory Visit: Payer: 59

## 2015-09-07 ENCOUNTER — Ambulatory Visit (INDEPENDENT_AMBULATORY_CARE_PROVIDER_SITE_OTHER): Payer: 59 | Admitting: Obstetrics and Gynecology

## 2015-09-07 ENCOUNTER — Encounter: Payer: Self-pay | Admitting: Obstetrics and Gynecology

## 2015-09-07 VITALS — BP 132/88 | HR 58 | Resp 16 | Ht 70.0 in | Wt 206.5 lb

## 2015-09-07 DIAGNOSIS — N400 Enlarged prostate without lower urinary tract symptoms: Secondary | ICD-10-CM | POA: Diagnosis not present

## 2015-09-07 DIAGNOSIS — N529 Male erectile dysfunction, unspecified: Secondary | ICD-10-CM

## 2015-09-07 MED ORDER — TADALAFIL 5 MG PO TABS: 5.0000 mg | ORAL_TABLET | Freq: Every day | ORAL | Status: DC

## 2015-09-07 MED ORDER — TADALAFIL 20 MG PO TABS: ORAL_TABLET | ORAL | Status: DC

## 2015-09-07 NOTE — Progress Notes (Signed)
11:31 AM   Ronald Hatfield 08/08/1965 [id number]  Referring provider: Tonia Ghent, MD [address] [place], [place] [address]  Chief Complaint  Patient presents with  . Benign Prostatic Hypertrophy  . Erectile Dysfunction    HPI: Patient is a 50 year old male with history of erectile dysfunction, low testosterone and BPH presenting today for his yearly follow-up. He has been taking once daily Cialis over the last year. He reports that his urinary symptoms are minimal with only occasional hesitancy with his first morning void.he feels that he may not need a daily medication for his urinary symptoms. He does report that he has been experiencing difficulty maintaining erections and is not satisfied with . He is previously taking Levitra for this as needed.  Significant history of PE in 2010, currently taking Coumadin, and ADD for which he takes daily Vyvanse.   [place]: Father- blood clots  02/20/14 PSA-0.66 Testosterone-254 Patient is not a candidate for testosterone replacement due to history of blood clots.  Interval History 09/07/15 Patient presents today for follow-up for erectile dysfunction. He was prescribed Levitra at his last visit. He has not satisfied with Levitra for erectile symptoms. He reports that he stopped taking his daily Cialis for his urinary symptoms and noticed that his urinary symptoms returned and were significantly bothersome to him. He would like to restart daily Cialis but is concerned that it well and not adequately treat his erectile dysfunction.   PMH: Past Medical History  Diagnosis Date  . Attention deficit disorder without mention of hyperactivity   . Depressive disorder, not elsewhere classified   . Long term (current) use of anticoagulants   . Encounter for therapeutic drug monitoring   . Routine general medical examination at a health care facility   . Pure hypercholesterolemia   . Other abnormal glucose   . Localized osteoarthrosis not  specified whether primary or secondary, hand   . Impotence of organic origin   . Overweight(278.02)   . Other pulmonary embolism and infarction     Hospitalization 09/12/08-09/16/08  . Dermatophytosis of the body   . Unspecified viral infection, in conditions classified elsewhere and of unspecified site   . RLS (restless legs syndrome)     Surgical History: Past Surgical History  Procedure Laterality Date  . Lipoma excision  1994    Right clavicular head  . Vasectomy  02/07/02    Dr. Council Mechanic    Home Medications:    Medication List       This list is accurate as of: 09/07/15 11:31 AM.  Always use your most recent med list.               CELEBREX 200 MG capsule  Generic drug:  celecoxib  TAKE 1 CAPSULE ONCE DAILY WITH FOOD     cetirizine 10 MG tablet  Commonly known as:  ZYRTEC  Take 10 mg by mouth daily.     CIALIS 5 MG tablet  Generic drug:  tadalafil  TAKE 1 TABLET EVERY DAY     COUMADIN 3 MG tablet  Generic drug:  warfarin  Take as directed by anti-coagulation clinic     VALTREX PO  Take by mouth.     vardenafil 10 MG tablet  Commonly known as:  LEVITRA  Take 1-2 tablets as needed 60 mins prior to sexual intercourse.  Do not exceed 2 tablets daily.        Allergies: No Known Allergies  Family History: Family History  Problem Relation  Age of Onset  . Alcohol abuse Father   . Deep vein thrombosis Father     h/o phlebitis  . Deep vein thrombosis Mother   . Skin cancer Mother   . Alzheimer's disease Paternal Grandmother   . Alcohol abuse Maternal Grandfather   . Diabetes Maternal Grandmother   . Coronary artery disease Maternal Grandmother   . Stroke Maternal Grandmother   . Prostate cancer Neg Hx   . Colon cancer Neg Hx   . Deep vein thrombosis Sister     Social History:  reports that he has never smoked. He has never used smokeless tobacco. He reports that he drinks alcohol. He reports that he does not use illicit  drugs.  ROS: UROLOGY Frequent Urination?: No Hard to postpone urination?: No Burning/pain with urination?: No Get up at night to urinate?: No Leakage of urine?: No Urine stream starts and stops?: No Trouble starting stream?: No Do you have to strain to urinate?: No Blood in urine?: No Urinary tract infection?: No Sexually transmitted disease?: No Injury to kidneys or bladder?: No Painful intercourse?: No Weak stream?: No Erection problems?: Yes Penile pain?: No  Gastrointestinal Nausea?: No Vomiting?: No Indigestion/heartburn?: No Diarrhea?: No Constipation?: No  Constitutional Fever: No Night sweats?: No Weight loss?: No Fatigue?: No  Skin Skin rash/lesions?: No Itching?: No  Eyes Blurred vision?: No Double vision?: No  Ears/Nose/Throat Sore throat?: No Sinus problems?: No  Hematologic/Lymphatic Swollen glands?: No Easy bruising?: No  Cardiovascular Leg swelling?: No Chest pain?: No  Respiratory Cough?: No Shortness of breath?: No  Endocrine Excessive thirst?: No  Musculoskeletal Back pain?: No Joint pain?: No  Neurological Headaches?: No Dizziness?: No  Psychologic Depression?: No Anxiety?: No  Physical Exam: BP 132/88 mmHg  Pulse 58  Resp 16  Ht 5\' 10"  (1.778 m)  Wt 206 lb 8 oz (93.668 kg)  BMI 29.63 kg/m2  Constitutional:  Alert and oriented, No acute distress. HEENT: [place] AT, moist mucus membranes.  Trachea midline, no masses. Cardiovascular: No clubbing, cyanosis, or edema. Respiratory: Normal respiratory effort, no increased work of breathing. Skin: No rashes, bruises or suspicious lesions. Neurologic: Grossly intact, no focal deficits, moving all 4 extremities. Psychiatric: Normal mood and affect.  Laboratory Data:   Urinalysis Results for orders placed or performed in visit on 08/05/15  POCT INR  Result Value Ref Range   INR 2.5     Pertinent Imaging:   Assessment & Plan:    1. BPH (benign prostatic  hyperplasia)- No satisfied with Levitra as needed.  Not doing well urinary symptoms.  Will go back to once daily Cialis and additional 10mg  as needed for ED. - BLADDER SCAN AMB NON-IMAGING - PR COMPLEX UROFLOWMETRY- insufficient voided volume  2.  Prostate cancer screening- DRE remarkble for an enlarged prostate and PSA checked today  No Follow-up on file.  These notes generated with voice recognition software. I apologize for typographical errors.  Herbert Moors, [facility] [address] [place], [place] [address] [phone]

## 2015-09-09 ENCOUNTER — Ambulatory Visit: Payer: 59

## 2015-09-10 ENCOUNTER — Telehealth: Payer: Self-pay | Admitting: Family Medicine

## 2015-09-10 NOTE — Telephone Encounter (Signed)
Please call for appointment asap.  I attempted to contact patient yesterday with no success.  Thanks.

## 2015-09-10 NOTE — Telephone Encounter (Signed)
Left message asking pt to call offcie

## 2015-09-10 NOTE — Telephone Encounter (Signed)
Patient did not come for their scheduled appointment 3/9 for pt inr  Please let me know if the patient needs to be contacted immediately for follow up or if no follow up is necessary.

## 2015-09-14 NOTE — Telephone Encounter (Signed)
09/23/15 appointment pt aware

## 2015-09-23 ENCOUNTER — Ambulatory Visit (INDEPENDENT_AMBULATORY_CARE_PROVIDER_SITE_OTHER): Payer: 59 | Admitting: *Deleted

## 2015-09-23 DIAGNOSIS — Z5181 Encounter for therapeutic drug level monitoring: Secondary | ICD-10-CM | POA: Diagnosis not present

## 2015-09-23 DIAGNOSIS — Z79899 Other long term (current) drug therapy: Secondary | ICD-10-CM

## 2015-09-23 DIAGNOSIS — Z86711 Personal history of pulmonary embolism: Secondary | ICD-10-CM | POA: Diagnosis not present

## 2015-09-23 LAB — POCT INR: INR: 2

## 2015-09-23 NOTE — Progress Notes (Signed)
Pre visit review using our clinic review tool, if applicable. No additional management support is needed unless otherwise documented below in the visit note. 

## 2015-10-12 ENCOUNTER — Other Ambulatory Visit: Payer: Self-pay | Admitting: *Deleted

## 2015-10-12 NOTE — Telephone Encounter (Signed)
Faxed refill request. Last Filled:    120 tablet 0 06/11/2015  Last office visit:   09/23/15  PT/INR   Please advise.

## 2015-10-13 ENCOUNTER — Other Ambulatory Visit: Payer: Self-pay

## 2015-10-13 DIAGNOSIS — N529 Male erectile dysfunction, unspecified: Secondary | ICD-10-CM

## 2015-10-13 MED ORDER — COUMADIN 3 MG PO TABS: ORAL_TABLET | ORAL | Status: DC

## 2015-10-13 MED ORDER — TADALAFIL 20 MG PO TABS: ORAL_TABLET | ORAL | Status: DC

## 2015-10-21 ENCOUNTER — Ambulatory Visit (INDEPENDENT_AMBULATORY_CARE_PROVIDER_SITE_OTHER): Payer: 59 | Admitting: *Deleted

## 2015-10-21 DIAGNOSIS — Z86711 Personal history of pulmonary embolism: Secondary | ICD-10-CM

## 2015-10-21 DIAGNOSIS — Z5181 Encounter for therapeutic drug level monitoring: Secondary | ICD-10-CM

## 2015-10-21 DIAGNOSIS — Z79899 Other long term (current) drug therapy: Secondary | ICD-10-CM

## 2015-10-21 LAB — POCT INR: INR: 2.5

## 2015-10-21 NOTE — Progress Notes (Signed)
Pre visit review using our clinic review tool, if applicable. No additional management support is needed unless otherwise documented below in the visit note. 

## 2015-12-02 ENCOUNTER — Ambulatory Visit (INDEPENDENT_AMBULATORY_CARE_PROVIDER_SITE_OTHER): Payer: 59 | Admitting: *Deleted

## 2015-12-02 DIAGNOSIS — Z5181 Encounter for therapeutic drug level monitoring: Secondary | ICD-10-CM | POA: Diagnosis not present

## 2015-12-02 DIAGNOSIS — Z86711 Personal history of pulmonary embolism: Secondary | ICD-10-CM

## 2015-12-02 DIAGNOSIS — Z79899 Other long term (current) drug therapy: Secondary | ICD-10-CM | POA: Diagnosis not present

## 2015-12-02 LAB — POCT INR: INR: 3.5

## 2015-12-02 NOTE — Progress Notes (Signed)
Pre visit review using our clinic review tool, if applicable. No additional management support is needed unless otherwise documented below in the visit note.  INR is supra therapeutic today.  Patient denies any new medications or diet changes.  He did report he took a double dose one day this week by mistake.  Patient instructed to skip Coumadin tomorrow, he has already taken today's dose.  He was encouraged to use a pill box to avoid medication errors in the future.  Will recheck in 2-3 weeks and make additional adjustments at that time if needed.

## 2015-12-27 ENCOUNTER — Other Ambulatory Visit (INDEPENDENT_AMBULATORY_CARE_PROVIDER_SITE_OTHER): Payer: 59

## 2015-12-27 DIAGNOSIS — Z79899 Other long term (current) drug therapy: Secondary | ICD-10-CM | POA: Diagnosis not present

## 2015-12-27 DIAGNOSIS — Z86711 Personal history of pulmonary embolism: Secondary | ICD-10-CM | POA: Diagnosis not present

## 2015-12-27 DIAGNOSIS — Z5181 Encounter for therapeutic drug level monitoring: Secondary | ICD-10-CM

## 2015-12-27 LAB — POCT INR: INR: 1.9

## 2016-01-17 ENCOUNTER — Other Ambulatory Visit (INDEPENDENT_AMBULATORY_CARE_PROVIDER_SITE_OTHER): Payer: 59

## 2016-01-17 DIAGNOSIS — Z5181 Encounter for therapeutic drug level monitoring: Secondary | ICD-10-CM

## 2016-01-17 DIAGNOSIS — Z79899 Other long term (current) drug therapy: Secondary | ICD-10-CM | POA: Diagnosis not present

## 2016-01-17 DIAGNOSIS — Z86711 Personal history of pulmonary embolism: Secondary | ICD-10-CM | POA: Diagnosis not present

## 2016-01-17 LAB — POCT INR: INR: 2.6

## 2016-01-24 ENCOUNTER — Other Ambulatory Visit: Payer: 59

## 2016-02-14 ENCOUNTER — Other Ambulatory Visit: Payer: 59

## 2016-02-14 ENCOUNTER — Other Ambulatory Visit (INDEPENDENT_AMBULATORY_CARE_PROVIDER_SITE_OTHER): Payer: 59

## 2016-02-14 DIAGNOSIS — Z79899 Other long term (current) drug therapy: Secondary | ICD-10-CM | POA: Diagnosis not present

## 2016-02-14 DIAGNOSIS — Z86711 Personal history of pulmonary embolism: Secondary | ICD-10-CM | POA: Diagnosis not present

## 2016-02-14 DIAGNOSIS — Z5181 Encounter for therapeutic drug level monitoring: Secondary | ICD-10-CM

## 2016-02-14 LAB — POCT INR: INR: 2.7

## 2016-04-10 ENCOUNTER — Other Ambulatory Visit (INDEPENDENT_AMBULATORY_CARE_PROVIDER_SITE_OTHER): Payer: 59

## 2016-04-10 DIAGNOSIS — Z5181 Encounter for therapeutic drug level monitoring: Secondary | ICD-10-CM

## 2016-04-10 DIAGNOSIS — Z86711 Personal history of pulmonary embolism: Secondary | ICD-10-CM | POA: Diagnosis not present

## 2016-04-10 DIAGNOSIS — Z79899 Other long term (current) drug therapy: Secondary | ICD-10-CM

## 2016-04-10 LAB — POCT INR: INR: 1.9

## 2016-04-11 ENCOUNTER — Other Ambulatory Visit: Payer: Self-pay | Admitting: Urology

## 2016-04-11 DIAGNOSIS — N529 Male erectile dysfunction, unspecified: Secondary | ICD-10-CM

## 2016-04-17 ENCOUNTER — Other Ambulatory Visit: Payer: Self-pay | Admitting: Family Medicine

## 2016-04-17 NOTE — Telephone Encounter (Signed)
Electronic refill request. Last Filled:    120 tablet 0 10/13/2015  Last office visit:   12/03/14 CPE  Please advise.

## 2016-04-17 NOTE — Telephone Encounter (Signed)
Has f/u pending. Sent. Thanks.   

## 2016-05-08 ENCOUNTER — Other Ambulatory Visit (INDEPENDENT_AMBULATORY_CARE_PROVIDER_SITE_OTHER): Payer: 59

## 2016-05-08 DIAGNOSIS — Z5181 Encounter for therapeutic drug level monitoring: Secondary | ICD-10-CM

## 2016-05-08 DIAGNOSIS — Z79899 Other long term (current) drug therapy: Secondary | ICD-10-CM

## 2016-05-08 DIAGNOSIS — Z86711 Personal history of pulmonary embolism: Secondary | ICD-10-CM

## 2016-05-08 LAB — POCT INR: INR: 2.4

## 2016-05-10 ENCOUNTER — Other Ambulatory Visit: Payer: Self-pay | Admitting: Family Medicine

## 2016-05-10 DIAGNOSIS — Z125 Encounter for screening for malignant neoplasm of prostate: Secondary | ICD-10-CM

## 2016-05-10 DIAGNOSIS — Z119 Encounter for screening for infectious and parasitic diseases, unspecified: Secondary | ICD-10-CM

## 2016-05-10 DIAGNOSIS — Z7901 Long term (current) use of anticoagulants: Secondary | ICD-10-CM

## 2016-05-10 DIAGNOSIS — Z8639 Personal history of other endocrine, nutritional and metabolic disease: Secondary | ICD-10-CM

## 2016-06-06 ENCOUNTER — Other Ambulatory Visit: Payer: 59

## 2016-06-13 ENCOUNTER — Ambulatory Visit (INDEPENDENT_AMBULATORY_CARE_PROVIDER_SITE_OTHER): Payer: 59

## 2016-06-13 ENCOUNTER — Other Ambulatory Visit (INDEPENDENT_AMBULATORY_CARE_PROVIDER_SITE_OTHER): Payer: 59

## 2016-06-13 DIAGNOSIS — Z23 Encounter for immunization: Secondary | ICD-10-CM | POA: Diagnosis not present

## 2016-06-13 DIAGNOSIS — Z5181 Encounter for therapeutic drug level monitoring: Secondary | ICD-10-CM | POA: Diagnosis not present

## 2016-06-13 DIAGNOSIS — Z125 Encounter for screening for malignant neoplasm of prostate: Secondary | ICD-10-CM

## 2016-06-13 DIAGNOSIS — Z86711 Personal history of pulmonary embolism: Secondary | ICD-10-CM

## 2016-06-13 DIAGNOSIS — Z8639 Personal history of other endocrine, nutritional and metabolic disease: Secondary | ICD-10-CM

## 2016-06-13 DIAGNOSIS — Z7901 Long term (current) use of anticoagulants: Secondary | ICD-10-CM

## 2016-06-13 DIAGNOSIS — Z119 Encounter for screening for infectious and parasitic diseases, unspecified: Secondary | ICD-10-CM

## 2016-06-13 LAB — LIPID PANEL
CHOL/HDL RATIO: 5
Cholesterol: 204 mg/dL — ABNORMAL HIGH (ref 0–200)
HDL: 44 mg/dL (ref >39.00)
LDL CALC: 141 mg/dL — AB (ref 0–99)
NONHDL: 160.16
Triglycerides: 96 mg/dL (ref 0.0–149.0)
VLDL: 19.2 mg/dL (ref 0.0–40.0)

## 2016-06-13 LAB — COMPREHENSIVE METABOLIC PANEL
ALT: 23 U/L (ref 0–53)
AST: 15 U/L (ref 0–37)
Albumin: 4.3 g/dL (ref 3.5–5.2)
Alkaline Phosphatase: 38 U/L — ABNORMAL LOW (ref 39–117)
BUN: 18 mg/dL (ref 6–23)
CO2: 29 meq/L (ref 19–32)
Calcium: 9.1 mg/dL (ref 8.4–10.5)
Chloride: 105 mEq/L (ref 96–112)
Creatinine, Ser: 1.11 mg/dL (ref 0.40–1.50)
GFR: 73.72 mL/min (ref >60.00)
GLUCOSE: 103 mg/dL — AB (ref 70–99)
POTASSIUM: 4.2 meq/L (ref 3.5–5.1)
Sodium: 141 mEq/L (ref 135–145)
Total Bilirubin: 0.6 mg/dL (ref 0.2–1.2)
Total Protein: 6.4 g/dL (ref 6.0–8.3)

## 2016-06-13 LAB — CBC WITH DIFFERENTIAL/PLATELET
BASOS ABS: 0 10*3/uL (ref 0.0–0.1)
Basophils Relative: 0.3 % (ref 0.0–3.0)
EOS ABS: 0.3 10*3/uL (ref 0.0–0.7)
Eosinophils Relative: 5.2 % — ABNORMAL HIGH (ref 0.0–5.0)
HCT: 44.1 % (ref 39.0–52.0)
Hemoglobin: 15.5 g/dL (ref 13.0–17.0)
LYMPHS ABS: 1.3 10*3/uL (ref 0.7–4.0)
Lymphocytes Relative: 24.2 % (ref 12.0–46.0)
MCHC: 35.1 g/dL (ref 30.0–36.0)
MCV: 87.1 fl (ref 78.0–100.0)
MONO ABS: 0.4 10*3/uL (ref 0.1–1.0)
MONOS PCT: 7.7 % (ref 3.0–12.0)
NEUTROS ABS: 3.3 10*3/uL (ref 1.4–7.7)
NEUTROS PCT: 62.6 % (ref 43.0–77.0)
PLATELETS: 221 10*3/uL (ref 150.0–400.0)
RBC: 5.06 Mil/uL (ref 4.22–5.81)
RDW: 12 % (ref 11.5–15.5)
WBC: 5.3 10*3/uL (ref 4.0–10.5)

## 2016-06-13 LAB — POCT INR: INR: 1.6

## 2016-06-13 LAB — PSA: PSA: 0.77 ng/mL (ref 0.10–4.00)

## 2016-06-13 NOTE — Patient Instructions (Signed)
Pre visit review using our clinic review tool, if applicable. No additional management support is needed unless otherwise documented below in the visit note.  INR today 1.6  Patient reports missing 1-2 doses in the past week of coumadin.  He denies any other changes in diet or regimen and is otherwise doing well.  Patient is to take an extra 1/2 pill today and tomorrow and then resume prior dosing and recheck in 3-4 weeks due to patient being out of town for the holiday.  Patient has been very well managed on current dosing regimen when taking doses daily. He was strongly encouraged to not miss any doses and is aware of risks associated with having a subtherapeutic level.  Patient verbalizes understanding of all instructions given today and will return to coumadin clinic on 07/08/15 for recheck.

## 2016-06-14 LAB — HEPATITIS C ANTIBODY: HCV AB: NEGATIVE

## 2016-06-16 ENCOUNTER — Ambulatory Visit: Payer: 59

## 2016-06-16 ENCOUNTER — Encounter: Payer: Self-pay | Admitting: Family Medicine

## 2016-06-16 ENCOUNTER — Ambulatory Visit (INDEPENDENT_AMBULATORY_CARE_PROVIDER_SITE_OTHER): Payer: 59 | Admitting: Family Medicine

## 2016-06-16 VITALS — BP 110/88 | HR 86 | Temp 98.4°F | Ht 70.0 in | Wt 242.0 lb

## 2016-06-16 DIAGNOSIS — Z86711 Personal history of pulmonary embolism: Secondary | ICD-10-CM

## 2016-06-16 DIAGNOSIS — R7309 Other abnormal glucose: Secondary | ICD-10-CM

## 2016-06-16 DIAGNOSIS — Z0000 Encounter for general adult medical examination without abnormal findings: Secondary | ICD-10-CM | POA: Diagnosis not present

## 2016-06-16 DIAGNOSIS — Z7189 Other specified counseling: Secondary | ICD-10-CM

## 2016-06-16 DIAGNOSIS — N529 Male erectile dysfunction, unspecified: Secondary | ICD-10-CM

## 2016-06-16 DIAGNOSIS — B001 Herpesviral vesicular dermatitis: Secondary | ICD-10-CM

## 2016-06-16 DIAGNOSIS — Z125 Encounter for screening for malignant neoplasm of prostate: Secondary | ICD-10-CM | POA: Insufficient documentation

## 2016-06-16 DIAGNOSIS — N400 Enlarged prostate without lower urinary tract symptoms: Secondary | ICD-10-CM

## 2016-06-16 DIAGNOSIS — D6859 Other primary thrombophilia: Secondary | ICD-10-CM

## 2016-06-16 DIAGNOSIS — E7800 Pure hypercholesterolemia, unspecified: Secondary | ICD-10-CM

## 2016-06-16 DIAGNOSIS — M25579 Pain in unspecified ankle and joints of unspecified foot: Secondary | ICD-10-CM | POA: Insufficient documentation

## 2016-06-16 MED ORDER — CELECOXIB 200 MG PO CAPS: ORAL_CAPSULE | ORAL | 3 refills | Status: DC

## 2016-06-16 MED ORDER — TADALAFIL 20 MG PO TABS: ORAL_TABLET | ORAL | 99 refills | Status: DC

## 2016-06-16 MED ORDER — TADALAFIL 5 MG PO TABS: 5.0000 mg | ORAL_TABLET | Freq: Every day | ORAL | 3 refills | Status: DC

## 2016-06-16 MED ORDER — VALACYCLOVIR HCL 1 G PO TABS: 2000.0000 mg | ORAL_TABLET | Freq: Two times a day (BID) | ORAL | 99 refills | Status: DC | PRN

## 2016-06-16 MED ORDER — COUMADIN 3 MG PO TABS: ORAL_TABLET | ORAL | 99 refills | Status: DC

## 2016-06-16 NOTE — Assessment & Plan Note (Signed)
Tetanus 2013  Flu already done.  PNA and shingles not due.  Living will d/w pt. Wife designated if patient were incapacitated.  Colonoscopy 2016  PSA wnl.   Diet and exercise d/w pt. Weight is up in the meantime.  He has plans for diet and exercise, has lost weight prev.   H/o blood donation at [place].   Lipids/LDL and sugar increased.  Needs work on diet and exercise, d/w pt.  He agrees.  Labs d/w pt.

## 2016-06-16 NOTE — Assessment & Plan Note (Signed)
Can use valtrex prn.

## 2016-06-16 NOTE — Progress Notes (Signed)
Pre visit review using our clinic review tool, if applicable. No additional management support is needed unless otherwise documented below in the visit note. 

## 2016-06-16 NOTE — Assessment & Plan Note (Signed)
D/w pt about diet and exercise. Labs d/w pt.  

## 2016-06-16 NOTE — Patient Instructions (Addendum)
Take care.  Glad to see you.  Update me as needed.  Recheck in about 1 year.   Work on [employer] in the meantime.

## 2016-06-16 NOTE — Progress Notes (Signed)
CPE- See plan.  Routine anticipatory guidance given to patient.  See health maintenance.v  Tetanus 2013  Flu already done.  PNA and shingles not due.  Living will d/w pt. Wife designated if patient were incapacitated.  Colonoscopy 2016  PSA wnl.   Diet and exercise d/w pt. Weight is up in the meantime.  He has plans for diet and exercise, has lost weight prev.   H/o blood donation at [place].   Lipids/LDL and sugar increased.  Needs work on diet and exercise, d/w pt.  He agrees.  Labs d/w pt.   ED treated with cialis.  D/w pt.  No ADE on med.    Has been taking celebrex for ankle pain w/o ADE.   S/p injections years ago.    H/o PE.  He has INR f/u pending.  He has good indication to continue anticoagulation, d/w pt.  He agrees.  No ADE on med other than some bruising.  Rare nosebleed or some bleeding with skin cracking.    PMH and SH reviewed  Meds, vitals, and allergies reviewed.   ROS: Per HPI.  Unless specifically indicated otherwise in HPI, the patient denies:  General: fever. Eyes: acute vision changes ENT: sore throat Cardiovascular: chest pain Respiratory: SOB GI: vomiting GU: dysuria Musculoskeletal: acute back pain Derm: acute rash Neuro: acute motor dysfunction Psych: worsening mood Endocrine: polydipsia Heme: bleeding Allergy: hayfever  GEN: nad, alert and oriented HEENT: mucous membranes moist NECK: supple w/o LA CV: rrr. PULM: ctab, no inc wob ABD: soft, +bs EXT: no edema SKIN: no acute rash

## 2016-06-16 NOTE — Assessment & Plan Note (Signed)
Living will d/w pt.  Wife designated if patient were incapacitated.   ?

## 2016-06-16 NOTE — Assessment & Plan Note (Signed)
Continue daily cialis with extra prn dosing.  No ADE on med.

## 2016-06-16 NOTE — Assessment & Plan Note (Signed)
Continue anticoagulation, very likely that benefit of tx >> risk of tx.  D/w pt.  He agrees.

## 2016-06-16 NOTE — Assessment & Plan Note (Signed)
Continue celebrex, no ADE on med.  It helps his pain.

## 2016-06-16 NOTE — Assessment & Plan Note (Signed)
Continue anticoagulation 

## 2016-06-18 NOTE — Progress Notes (Signed)
Agree. Thanks

## 2016-06-20 ENCOUNTER — Telehealth: Payer: Self-pay | Admitting: *Deleted

## 2016-06-20 NOTE — Telephone Encounter (Signed)
PA submitted to [employer] for Cialis, awaiting response.

## 2016-07-07 ENCOUNTER — Ambulatory Visit: Payer: 59

## 2016-07-07 ENCOUNTER — Ambulatory Visit (INDEPENDENT_AMBULATORY_CARE_PROVIDER_SITE_OTHER): Payer: 59

## 2016-07-07 DIAGNOSIS — Z5181 Encounter for therapeutic drug level monitoring: Secondary | ICD-10-CM

## 2016-07-07 DIAGNOSIS — Z86711 Personal history of pulmonary embolism: Secondary | ICD-10-CM

## 2016-07-07 LAB — POCT INR: INR: 2.7

## 2016-07-07 NOTE — Patient Instructions (Signed)
Pre visit review using our clinic review tool, if applicable. No additional management support is needed unless otherwise documented below in the visit note. 

## 2016-07-09 NOTE — Progress Notes (Signed)
Agree. Thanks

## 2016-08-08 ENCOUNTER — Other Ambulatory Visit: Payer: Self-pay

## 2016-08-08 ENCOUNTER — Ambulatory Visit (INDEPENDENT_AMBULATORY_CARE_PROVIDER_SITE_OTHER): Payer: 59

## 2016-08-08 DIAGNOSIS — Z5181 Encounter for therapeutic drug level monitoring: Secondary | ICD-10-CM

## 2016-08-08 DIAGNOSIS — Z86711 Personal history of pulmonary embolism: Secondary | ICD-10-CM

## 2016-08-08 DIAGNOSIS — N400 Enlarged prostate without lower urinary tract symptoms: Secondary | ICD-10-CM

## 2016-08-08 DIAGNOSIS — N529 Male erectile dysfunction, unspecified: Secondary | ICD-10-CM

## 2016-08-08 LAB — POCT INR: INR: 2.7

## 2016-08-08 NOTE — Telephone Encounter (Signed)
Pt left note requesting printed rx for celebrex, zyrtec,cialis 20 and 5 mg rxs, valtrex and coumadin. Due to ins changing copays this year more affordable to get mail order. Pt request call when ready for pick up. Last annual exam 06/16/16.

## 2016-08-08 NOTE — Patient Instructions (Signed)
Pre visit review using our clinic review tool, if applicable. No additional management support is needed unless otherwise documented below in the visit note. 

## 2016-08-09 MED ORDER — VALACYCLOVIR HCL 1 G PO TABS: 2000.0000 mg | ORAL_TABLET | Freq: Two times a day (BID) | ORAL | 99 refills | Status: DC | PRN

## 2016-08-09 MED ORDER — TADALAFIL 5 MG PO TABS: 5.0000 mg | ORAL_TABLET | Freq: Every day | ORAL | 3 refills | Status: DC

## 2016-08-09 MED ORDER — CELECOXIB 200 MG PO CAPS
ORAL_CAPSULE | ORAL | 3 refills | Status: DC
Start: 2016-08-09 — End: 2016-09-08

## 2016-08-09 MED ORDER — CETIRIZINE HCL 10 MG PO TABS: 10.0000 mg | ORAL_TABLET | Freq: Every day | ORAL | 3 refills | Status: DC

## 2016-08-09 MED ORDER — COUMADIN 3 MG PO TABS: ORAL_TABLET | ORAL | 99 refills | Status: DC

## 2016-08-09 MED ORDER — TADALAFIL 20 MG PO TABS
ORAL_TABLET | ORAL | 99 refills | Status: DC
Start: 2016-08-09 — End: 2017-06-19

## 2016-08-09 NOTE — Telephone Encounter (Signed)
Patient notified by telephone that script is up front ready for pickup. 

## 2016-08-09 NOTE — Telephone Encounter (Signed)
Printed

## 2016-08-10 NOTE — Progress Notes (Signed)
Agree 

## 2016-09-05 ENCOUNTER — Ambulatory Visit (INDEPENDENT_AMBULATORY_CARE_PROVIDER_SITE_OTHER): Payer: 59

## 2016-09-05 DIAGNOSIS — Z5181 Encounter for therapeutic drug level monitoring: Secondary | ICD-10-CM

## 2016-09-05 DIAGNOSIS — Z86711 Personal history of pulmonary embolism: Secondary | ICD-10-CM | POA: Diagnosis not present

## 2016-09-05 LAB — POCT INR: INR: 5.7

## 2016-09-05 NOTE — Progress Notes (Signed)
4:58 PM   Ronald Hatfield 06-15-1966 WI:[id number]  Referring provider: Tonia Ghent, MD [address] [place], [place] [address]  Chief Complaint  Patient presents with  . Erectile Dysfunction    HPI: 51 yo WM with ED, hypogonadism and BPH with LU TS who presents today for a one year follow up.  Erectile dysfunction His SHIM score is 12, which is mild to moderate ED.   He has been having difficulty with erections for five years.   His major complaint is maintaining the erections.  His libido is preserved.   His risk factors for ED are age, BPH, hypogonadism, HLD and anticoagulation.  He denies any painful erections or curvatures with his erections.   He is still having/no longer having spontaneous erections.  He has tried PDE5-inhibitors in the past and they have been effective.       SHIM    Row Name 09/06/16 1134         SHIM: Over the last 6 months:   How do you rate your confidence that you could get and keep an erection? Low     When you had erections with sexual stimulation, how often were your erections hard enough for penetration (entering your partner)? Most Times (much more than half the time)     During sexual intercourse, how often were you able to maintain your erection after you had penetrated (entered) your partner? Very Difficult     During sexual intercourse, how difficult was it to maintain your erection to completion of intercourse? Very Difficult     When you attempted sexual intercourse, how often was it satisfactory for you? Very Difficult       SHIM Total Score   SHIM 12        Score: 1-7 Severe ED 8-11 Moderate ED 12-16 Mild-Moderate ED 17-21 Mild ED 22-25 No ED   Hypogonadism Testosterone-254 Patient is not a candidate for testosterone replacement due to history of blood clots.  BPH WITH LUTS His IPSS score today is 17, which is moderate lower urinary tract symptomatology.  He is mostly satisfied with his quality life due to his urinary  symptoms.  His major complaint today is intermittency.  He has had these symptoms for the last few years.  He denies any dysuria, hematuria or suprapubic pain.  He currently taking Cialis 5 mg daily.  He also denies any recent fevers, chills, nausea or vomiting.  He does not have a family history of PCa.     IPSS    Row Name 09/06/16 1100         International Prostate Symptom Score   How often have you had the sensation of not emptying your bladder? About half the time     How often have you had to urinate less than every two hours? More than half the time     How often have you found you stopped and started again several times when you urinated? Less than half the time     How often have you found it difficult to postpone urination? Less than 1 in 5 times     How often have you had a weak urinary stream? Less than half the time     How often have you had to strain to start urination? Not at All     How many times did you typically get up at night to urinate? 5 Times     Total IPSS Score 17  Quality of Life due to urinary symptoms   If you were to spend the rest of your life with your urinary condition just the way it is now how would you feel about that? Mostly Satisfied        Score:  1-7 Mild 8-19 Moderate 20-35 Severe  Patient states he had dark colored urine while he was in the [place].  It is clear now and he states his INR were elevated when he returned from his vacation.    PMH: Past Medical History:  Diagnosis Date  . Attention deficit disorder without mention of hyperactivity   . Depressive disorder, not elsewhere classified   . Dermatophytosis of the body   . Encounter for therapeutic drug monitoring   . Impotence of organic origin   . 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 Long term (current) use of anticoagulants   . Other abnormal glucose   . Other pulmonary embolism and infarction    Hospitalization 09/12/08-09/16/08   . Overweight(278.02)   . Pure hypercholesterolemia   . RLS (restless legs syndrome)   . Routine general medical examination at a health care facility   . Unspecified viral infection, in conditions classified elsewhere and of unspecified site     Surgical History: Past Surgical History:  Procedure Laterality Date  . LIPOMA EXCISION  1994   Right clavicular head  . VASECTOMY  02/07/02   Dr. Council Mechanic    Home Medications:  Allergies as of 09/06/2016   No Known Allergies     Medication List       Accurate as of 09/06/16  4:58 PM. Always use your most recent med list.          celecoxib 200 MG capsule Commonly known as:  CELEBREX TAKE 1 CAPSULE ONCE DAILY WITH FOOD   cetirizine 10 MG tablet Commonly known as:  ZYRTEC Take 1 tablet (10 mg total) by mouth daily.   COUMADIN 3 MG tablet Generic drug:  warfarin TAKE AS DIRECTED BY ANTI-COAGULATION CLINIC   tadalafil 20 MG tablet Commonly known as:  CIALIS TAKE 1/2 TABLET AS NEEDED 45 MINUTES PRIOR TO INTERCOURSE   tadalafil 5 MG tablet Commonly known as:  CIALIS Take 1 tablet (5 mg total) by mouth daily.   valACYclovir 1000 MG tablet Commonly known as:  VALTREX Take 2 tablets (2,000 mg total) by mouth 2 (two) times daily as needed (for one day).       Allergies: No Known Allergies  Family History: Family History  Problem Relation Age of Onset  . Alcohol abuse Father   . Deep vein thrombosis Father     h/o phlebitis  . Deep vein thrombosis Mother   . Skin cancer Mother   . Deep vein thrombosis Sister   . Alzheimer's disease Paternal Grandmother   . Alcohol abuse Maternal Grandfather   . Diabetes Maternal Grandmother   . Coronary artery disease Maternal Grandmother   . Stroke Maternal Grandmother   . Prostate cancer Neg Hx   . Colon cancer Neg Hx     Social History:  reports that he has never smoked. He has never used smokeless tobacco. He reports that he drinks alcohol. He reports that he does not use  drugs.  ROS: UROLOGY Frequent Urination?: No Hard to postpone urination?: No Burning/pain with urination?: No Get up at night to urinate?: No Leakage of urine?: No Urine stream starts and stops?: Yes Trouble starting stream?: No Do you have to strain  to urinate?: No Blood in urine?: No Urinary tract infection?: No Sexually transmitted disease?: No Injury to kidneys or bladder?: No Painful intercourse?: No Weak stream?: No Erection problems?: Yes Penile pain?: No  Gastrointestinal Nausea?: No Vomiting?: No Indigestion/heartburn?: No Diarrhea?: No Constipation?: No  Constitutional Fever: No Night sweats?: No Weight loss?: No Fatigue?: No  Skin Skin rash/lesions?: No Itching?: No  Eyes Blurred vision?: No Double vision?: No  Ears/Nose/Throat Sore throat?: No Sinus problems?: No  Hematologic/Lymphatic Swollen glands?: No Easy bruising?: No  Cardiovascular Leg swelling?: No Chest pain?: No  Respiratory Cough?: No Shortness of breath?: No  Endocrine Excessive thirst?: No  Musculoskeletal Back pain?: No Joint pain?: No  Neurological Headaches?: No Dizziness?: No  Psychologic Depression?: No Anxiety?: No  Physical Exam: BP (!) 133/98   Pulse 71   Ht 5\' 11"  (1.803 m)   Wt 246 lb 8 oz (111.8 kg)   BMI 34.38 kg/m   Constitutional: Well nourished. Alert and oriented, No acute distress. HEENT: [place] AT, moist mucus membranes. Trachea midline, no masses. Cardiovascular: No clubbing, cyanosis, or edema. Respiratory: Normal respiratory effort, no increased work of breathing. GI: Abdomen is soft, non tender, non distended, no abdominal masses. Liver and spleen not palpable.  No hernias appreciated.  Stool sample for occult testing is not indicated.   GU: No CVA tenderness.  No bladder fullness or masses.  Patient with circumcised phallus.   Urethral meatus is patent.  No penile discharge. No penile lesions or rashes. Scrotum without lesions, cysts,  rashes and/or edema.  Testicles are located scrotally bilaterally. No masses are appreciated in the testicles. Left and right epididymis are normal. Rectal: Patient with  normal sphincter tone. Anus and perineum without scarring or rashes. No rectal masses are appreciated. Prostate is approximately 55 grams, no nodules are appreciated. Seminal vesicles are normal. Skin: No rashes, bruises or suspicious lesions. Lymph: No cervical or inguinal adenopathy. Neurologic: Grossly intact, no focal deficits, moving all 4 extremities. Psychiatric: Normal mood and affect.  Laboratory Data: PSA History  0.66 ng/mL on 06/13/2014  0.90 ng/mL on 06/14/2015  0.77 ng/mL on 06/13/2016  Results for orders placed or performed in visit on 09/06/16  Microscopic Examination  Result Value Ref Range   WBC, UA None seen 0 - 5 /hpf   RBC, UA 0-2 0 - 2 /hpf   Epithelial Cells (non renal) None seen 0 - 10 /hpf   Crystals Present (A) N/A   Crystal Type Calcium Oxalate N/A   Mucus, UA Present (A) Not Estab.   Bacteria, UA None seen None seen/Few  Urinalysis, Complete  Result Value Ref Range   Specific Gravity, UA >1.030 (H) 1.005 - 1.030   pH, UA 5.5 5.0 - 7.5   Color, UA Yellow Yellow   Appearance Ur Clear Clear   Leukocytes, UA Negative Negative   Protein, UA Trace (A) Negative/Trace   Glucose, UA Negative Negative   Ketones, UA Negative Negative   RBC, UA Negative Negative   Bilirubin, UA Negative Negative   Urobilinogen, Ur 0.2 0.2 - 1.0 mg/dL   Nitrite, UA Negative Negative   Microscopic Examination See below:     Assessment & Plan:    1. Erectile dysfunction  - SHIM score is 12  - Continue Cialis  - RTC in 12 months for repeat SHIM score and exam   2. BPH with LUTS  - IPSS score is 17/2  - Continue conservative management, avoiding bladder irritants and timed voiding's  - Continue  Cialis 5 mg daily  - RTC in 12 months for I PSS and exam   3. Gross hematuria  - I explained to the patient  that there are a number of causes that can be associated with blood in the urine, such as stones, BPH, UTI's, damage to the urinary tract and/or cancer.  - At this time, I felt that the patient warranted further urologic evaluation - patient deferred hematuria work up   - Explained to the patient that the risk of missing kidney cancer (64,000 new cases in the [place] and 14,000 deaths in the [place] every year) and a 20% with gross hematuria and a 2% to 5% risk with microscopic hematuria of missing a bladder cancer.   - UA - AMH not present on today's sample  - patient encouraged to report any gross hematuria   Return in about 1 year (around 09/06/2017) for IPSS, SHIM and exam .  These notes generated with voice recognition software. I apologize for typographical errors.  Zara Council, [facility] [address], [place], [place] [address] [phone]

## 2016-09-05 NOTE — Patient Instructions (Signed)
Pre visit review using our clinic review tool, if applicable. No additional management support is needed unless otherwise documented below in the visit note.  INR today 5.7  Patient reports recently travelling out of the country on vacation and consumed more than his usual amount of alcohol.  In addition, he thinks he may have mixed up some of his medications and took extra coumadin through the week, not realizing until today.  He does verbalize small amount of blood tinged urine at times and darker colored stool intermittent over the past week.  He denies any symptoms today and understands this is likely due to the elevation in his INR as seen on testing today.  We discussed at length his diet, medications and health history with no other changes to report.  Overall, he reports feeling fine with no other abnormal signs of bruising or bleeding.    Patient instructed to hold coumadin tomorrow 3/7 (as he has already taken today), Thursday 3/8 and Friday 3/9 and then return to his prior dosing of 3mg  daily EXCEPT for 4.5mg  on Monday, Wednesday and Friday. Recheck in 1 week.  This dosing has consisently been therapeutic for patient and today's elevation most likely due to extra doses and diet/alcohol consumption change over vacation.  Patient verbalizes understanding of risks associated with supratherapeutic level and of all instructions given today.  He will go to ER if any concerns develop.

## 2016-09-06 ENCOUNTER — Encounter: Payer: Self-pay | Admitting: Urology

## 2016-09-06 ENCOUNTER — Ambulatory Visit: Payer: 59 | Admitting: Urology

## 2016-09-06 VITALS — BP 133/98 | HR 71 | Ht 71.0 in | Wt 246.5 lb

## 2016-09-06 DIAGNOSIS — N138 Other obstructive and reflux uropathy: Secondary | ICD-10-CM | POA: Diagnosis not present

## 2016-09-06 DIAGNOSIS — R310 Gross hematuria: Secondary | ICD-10-CM

## 2016-09-06 DIAGNOSIS — N529 Male erectile dysfunction, unspecified: Secondary | ICD-10-CM | POA: Diagnosis not present

## 2016-09-06 DIAGNOSIS — N401 Enlarged prostate with lower urinary tract symptoms: Secondary | ICD-10-CM | POA: Diagnosis not present

## 2016-09-06 LAB — URINALYSIS, COMPLETE
BILIRUBIN UA: NEGATIVE
Glucose, UA: NEGATIVE
Ketones, UA: NEGATIVE
LEUKOCYTES UA: NEGATIVE
Nitrite, UA: NEGATIVE
PH UA: 5.5 (ref 5.0–7.5)
RBC UA: NEGATIVE
Specific Gravity, UA: >1.03 — ABNORMAL HIGH (ref 1.005–1.030)
Urobilinogen, Ur: 0.2 mg/dL (ref 0.2–1.0)

## 2016-09-06 LAB — MICROSCOPIC EXAMINATION
BACTERIA UA: NONE SEEN
EPITHELIAL CELLS (NON RENAL): NONE SEEN /HPF (ref 0–10)
WBC, UA: NONE SEEN /hpf (ref 0–?)

## 2016-09-06 NOTE — Progress Notes (Signed)
Agree. Thanks

## 2016-09-08 ENCOUNTER — Other Ambulatory Visit: Payer: Self-pay | Admitting: *Deleted

## 2016-09-08 MED ORDER — CELECOXIB 200 MG PO CAPS: ORAL_CAPSULE | ORAL | 3 refills | Status: DC

## 2016-09-08 MED ORDER — COUMADIN 3 MG PO TABS: ORAL_TABLET | ORAL | 99 refills | Status: DC

## 2016-09-08 MED ORDER — CETIRIZINE HCL 10 MG PO TABS: 10.0000 mg | ORAL_TABLET | Freq: Every day | ORAL | 3 refills | Status: DC

## 2016-09-12 ENCOUNTER — Other Ambulatory Visit: Payer: Self-pay

## 2016-09-12 ENCOUNTER — Ambulatory Visit (INDEPENDENT_AMBULATORY_CARE_PROVIDER_SITE_OTHER): Payer: 59

## 2016-09-12 DIAGNOSIS — Z5181 Encounter for therapeutic drug level monitoring: Secondary | ICD-10-CM | POA: Diagnosis not present

## 2016-09-12 DIAGNOSIS — Z86711 Personal history of pulmonary embolism: Secondary | ICD-10-CM

## 2016-09-12 LAB — POCT INR: INR: 1.6

## 2016-09-12 NOTE — Patient Instructions (Signed)
Pre visit review using our clinic review tool, if applicable. No additional management support is needed unless otherwise documented below in the visit note. 

## 2016-09-12 NOTE — Telephone Encounter (Signed)
[facility] left v/m requesting refill valacyclovir. Last refilled # 20 on 08/09/16. Last seen 06/16/16 for  annual. Please advise.

## 2016-09-13 MED ORDER — VALACYCLOVIR HCL 1 G PO TABS: 2000.0000 mg | ORAL_TABLET | Freq: Two times a day (BID) | ORAL | 0 refills | Status: DC | PRN

## 2016-09-13 NOTE — Telephone Encounter (Signed)
Spoke to patient and was advised that he had a physical in December.

## 2016-09-13 NOTE — Telephone Encounter (Signed)
Sent. Thanks.  Due for CPE. 

## 2016-09-14 MED ORDER — VALACYCLOVIR HCL 1 G PO TABS: 2000.0000 mg | ORAL_TABLET | Freq: Two times a day (BID) | ORAL | 12 refills | Status: DC | PRN

## 2016-09-14 NOTE — Telephone Encounter (Signed)
He is correct, my mistake, I apologize. rx sent.  Thanks.

## 2016-09-19 ENCOUNTER — Ambulatory Visit (INDEPENDENT_AMBULATORY_CARE_PROVIDER_SITE_OTHER): Payer: 59

## 2016-09-19 DIAGNOSIS — Z5181 Encounter for therapeutic drug level monitoring: Secondary | ICD-10-CM

## 2016-09-19 DIAGNOSIS — Z86711 Personal history of pulmonary embolism: Secondary | ICD-10-CM | POA: Diagnosis not present

## 2016-09-19 LAB — POCT INR: INR: 4.1

## 2016-09-19 NOTE — Patient Instructions (Signed)
Pre visit review using our clinic review tool, if applicable. No additional management support is needed unless otherwise documented below in the visit note.  INR today 4.1  Reviewed patient's history including meds, diet and recent health.  The only change that he can identify is that in the past month, his mail order has changed all of his medications over to generic.  Otherwise, there has been nothing that he can identify.  Patient denies any unusual bleeding or bruising and was educated on risks associated with supratherapeutic levels.  He will go to the ER if any concerns arise.  He is to hold his coumadin tomorrow (3/21) as already taken today, and hold Thursday 3/22.  Then he is to begin decreased dosing of 1 pill daily EXCEPT for 1.5 pills on Mondays and Fridays.  Recheck in 2 weeks.  Patient verbalizes understanding of all instructions given today.

## 2016-09-20 NOTE — Progress Notes (Signed)
Agreed.  Thanks.  

## 2016-10-03 ENCOUNTER — Telehealth: Payer: Self-pay

## 2016-10-03 NOTE — Telephone Encounter (Signed)
Calling to verify when is next INR Check?   Spoke with patient - I had appointment down for today; however, it did not get added to my schedule and patient did not receive a reminder as a result.  Patient going out of town for the remainder of the week and unable to come in until 10/10/16.    Appt given for 10/10/16 at 1:15pm.  Patient is doing well denies any abnormal bruising or bleeding.

## 2016-10-10 ENCOUNTER — Telehealth: Payer: Self-pay

## 2016-10-10 ENCOUNTER — Ambulatory Visit (INDEPENDENT_AMBULATORY_CARE_PROVIDER_SITE_OTHER): Payer: 59

## 2016-10-10 DIAGNOSIS — Z86711 Personal history of pulmonary embolism: Secondary | ICD-10-CM

## 2016-10-10 DIAGNOSIS — Z5181 Encounter for therapeutic drug level monitoring: Secondary | ICD-10-CM

## 2016-10-10 LAB — POCT INR: INR: 5.3

## 2016-10-10 NOTE — Patient Instructions (Addendum)
Pre visit review using our clinic review tool, if applicable. No additional management support is needed unless otherwise documented below in the visit note.   INR today 5.3  Patient and I continue to extensively review his, health, diet and medications.  The only change over the past 1-2 months is that all of his medications have been changed to generics including his coumadin.  He also may be mixing up brands with generic coumadin pills that he has on hand as he is trying to fill his pill box.  We are working hard to streamline his medications to brand or generic only and he will review this when he gets home.  In addition, will review meds and dosing further with MD to determine if any further recommendations. Patient does report deviating from past weeks prescribed dosing to take 3mg  daily EXCEPT for 4.5 on Mondays and Fridays.  Instead patient thought he was to only do that for 1 week and the rest he was to resume his prior dosing of 3mg  daily EXCEPT 4.5mg  on Mon, Wed, and Friday.    He denies any unusual or uncontrollable bruising or bleeding and will go to the ER if any concerns develop.  Patient is to hold coumadin tomorrow (already taken today), Thursday (4/12) and Friday (4/13) and then take decreased dosing of 3mg  (1 pill) daily and recheck in 2 weeks.  Patient verbalizes understanding of all instructions given today and risks associated with supratherapeutic level.

## 2016-10-10 NOTE — Telephone Encounter (Signed)
Patient in for ongoing management today in the coumadin clinic.    Since early March patient has continuously experienced fluctuations (mostly elevated) in INR readings.  This writer has dosed per coumadin protocol but still having difficulties getting in therapeutic range.  After extensive investigation, there have been a couple times where patient deviated from dose due to misunderstanding despite written instructions given but overall main change has been that his mail order has changed all of his medications over to generics starting in March.  Including his coumadin.  He is requesting that, if need be, that we order all medications as "brand name only"?  First, he will check on his insurance coverage and cost if this is a factor.    In meantime, he would like to know if you have any other suggestions or recommendations.  His diet, health hx and alcohol consumption have been consistent per his report. He denies starting any new supplements, juices, teas or spices.    Any thoughts?

## 2016-10-11 NOTE — Telephone Encounter (Signed)
I don't have suggestions other than picking one manufacturer and sticking with that, DAW or otherwise.  Thanks.

## 2016-10-15 NOTE — Progress Notes (Signed)
Agree. Thanks

## 2016-10-24 ENCOUNTER — Ambulatory Visit (INDEPENDENT_AMBULATORY_CARE_PROVIDER_SITE_OTHER): Payer: 59

## 2016-10-24 DIAGNOSIS — Z5181 Encounter for therapeutic drug level monitoring: Secondary | ICD-10-CM

## 2016-10-24 DIAGNOSIS — Z86711 Personal history of pulmonary embolism: Secondary | ICD-10-CM

## 2016-10-24 LAB — POCT INR: INR: 2.2

## 2016-10-24 NOTE — Patient Instructions (Signed)
Pre visit review using our clinic review tool, if applicable. No additional management support is needed unless otherwise documented below in the visit note. 

## 2016-10-25 NOTE — Telephone Encounter (Signed)
Patient made aware at INR check on 10/24/16.  INR today was 2.2 and beginning to stabilize.   Thanks.

## 2016-10-28 NOTE — Progress Notes (Signed)
Agree. Thanks

## 2016-11-13 ENCOUNTER — Other Ambulatory Visit: Payer: Self-pay | Admitting: *Deleted

## 2016-11-13 DIAGNOSIS — N400 Enlarged prostate without lower urinary tract symptoms: Secondary | ICD-10-CM

## 2016-11-13 NOTE — Telephone Encounter (Signed)
Received faxed refill from [facility] Last refill 08/09/16 #90/3 Last office visit 06/16/16

## 2016-11-14 MED ORDER — TADALAFIL 5 MG PO TABS: 5.0000 mg | ORAL_TABLET | Freq: Every day | ORAL | 3 refills | Status: DC

## 2016-11-14 NOTE — Telephone Encounter (Signed)
Sent!

## 2016-11-21 ENCOUNTER — Ambulatory Visit (INDEPENDENT_AMBULATORY_CARE_PROVIDER_SITE_OTHER): Payer: 59

## 2016-11-21 DIAGNOSIS — Z86711 Personal history of pulmonary embolism: Secondary | ICD-10-CM

## 2016-11-21 DIAGNOSIS — Z5181 Encounter for therapeutic drug level monitoring: Secondary | ICD-10-CM | POA: Diagnosis not present

## 2016-11-21 LAB — POCT INR: INR: 2.4

## 2016-11-21 NOTE — Patient Instructions (Signed)
Pre visit review using our clinic review tool, if applicable. No additional management support is needed unless otherwise documented below in the visit note. 

## 2016-11-27 NOTE — Progress Notes (Signed)
Agree, thanks

## 2016-12-26 ENCOUNTER — Ambulatory Visit (INDEPENDENT_AMBULATORY_CARE_PROVIDER_SITE_OTHER): Payer: 59

## 2016-12-26 ENCOUNTER — Other Ambulatory Visit (INDEPENDENT_AMBULATORY_CARE_PROVIDER_SITE_OTHER): Payer: 59

## 2016-12-26 DIAGNOSIS — Z79899 Other long term (current) drug therapy: Secondary | ICD-10-CM | POA: Diagnosis not present

## 2016-12-26 DIAGNOSIS — Z86711 Personal history of pulmonary embolism: Secondary | ICD-10-CM

## 2016-12-26 DIAGNOSIS — Z5181 Encounter for therapeutic drug level monitoring: Secondary | ICD-10-CM

## 2016-12-26 LAB — POCT INR: INR: 3.9

## 2016-12-26 NOTE — Patient Instructions (Signed)
Pre visit review using our clinic review tool, if applicable. No additional management support is needed unless otherwise documented below in the visit note. 

## 2016-12-27 NOTE — Progress Notes (Signed)
Agree. Thanks

## 2017-01-09 ENCOUNTER — Ambulatory Visit: Payer: 59

## 2017-01-09 ENCOUNTER — Ambulatory Visit (INDEPENDENT_AMBULATORY_CARE_PROVIDER_SITE_OTHER): Payer: 59

## 2017-01-09 DIAGNOSIS — Z5181 Encounter for therapeutic drug level monitoring: Secondary | ICD-10-CM

## 2017-01-09 DIAGNOSIS — Z86711 Personal history of pulmonary embolism: Secondary | ICD-10-CM

## 2017-01-09 LAB — POCT INR: INR: 1.4

## 2017-01-09 NOTE — Patient Instructions (Signed)
Pre visit review using our clinic review tool, if applicable. No additional management support is needed unless otherwise documented below in the visit note.  INR today 1.4  Patient has been on antibiotics 4x/daily X 10 days and completed course yesterday.  While on therapy he was to take 1/2 of his coumadin dose daily which explains the subtherapeutic level.  Today patient instructed to boost his coumadin taking 1.5 pills (4.5mg ) today 7/10 and tomorrow 7/11 and then resume prior abx dose of 1 pill (3mg ) daily EXCEPT for 1/2 pill (1.5mg ) on Saturdays.  Recheck in 2-3 weeks.  Patient has scheduling conflict and requests recheck in 3 weeks.  Patient verbalizes understanding of risks associated with subtherapeutic level and will go to ER if any concerns develop.

## 2017-01-09 NOTE — Progress Notes (Signed)
agree.  thanks.  

## 2017-01-30 ENCOUNTER — Ambulatory Visit (INDEPENDENT_AMBULATORY_CARE_PROVIDER_SITE_OTHER): Payer: 59

## 2017-01-30 DIAGNOSIS — Z5181 Encounter for therapeutic drug level monitoring: Secondary | ICD-10-CM

## 2017-01-30 DIAGNOSIS — Z86711 Personal history of pulmonary embolism: Secondary | ICD-10-CM

## 2017-01-30 LAB — POCT INR: INR: 2.4

## 2017-01-30 NOTE — Patient Instructions (Signed)
Pre visit review using our clinic review tool, if applicable. No additional management support is needed unless otherwise documented below in the visit note. 

## 2017-01-31 NOTE — Progress Notes (Signed)
Agree, thanks

## 2017-02-27 ENCOUNTER — Ambulatory Visit (INDEPENDENT_AMBULATORY_CARE_PROVIDER_SITE_OTHER): Payer: 59

## 2017-02-27 DIAGNOSIS — Z5181 Encounter for therapeutic drug level monitoring: Secondary | ICD-10-CM

## 2017-02-27 DIAGNOSIS — Z86711 Personal history of pulmonary embolism: Secondary | ICD-10-CM

## 2017-02-27 LAB — POCT INR: INR: 3

## 2017-02-27 NOTE — Patient Instructions (Signed)
Pre visit review using our clinic review tool, if applicable. No additional management support is needed unless otherwise documented below in the visit note. 

## 2017-02-28 NOTE — Progress Notes (Signed)
Agree, thanks

## 2017-03-01 ENCOUNTER — Telehealth: Payer: Self-pay | Admitting: Family Medicine

## 2017-03-01 NOTE — Telephone Encounter (Signed)
Pt called to be advised. His last inr was 3.0 on 8/28. He has a dental produce on Wed 9/5 and they want him to hold coumadin for 4 days prior to appt. Is this OK? He is requesting a cb.

## 2017-03-02 NOTE — Telephone Encounter (Signed)
Please call pt.  Stop for 4 days, then restart as he has been taking the day after the procedure.  Recheck here with Mandy 1 week after restart. Thanks.   Routed to [place] as [place].

## 2017-03-02 NOTE — Telephone Encounter (Signed)
Patient advised.   Patient will call for appt after procedure.

## 2017-03-27 ENCOUNTER — Ambulatory Visit: Payer: 59

## 2017-03-27 ENCOUNTER — Ambulatory Visit (INDEPENDENT_AMBULATORY_CARE_PROVIDER_SITE_OTHER): Payer: 59

## 2017-03-27 DIAGNOSIS — Z86711 Personal history of pulmonary embolism: Secondary | ICD-10-CM

## 2017-03-27 DIAGNOSIS — Z5181 Encounter for therapeutic drug level monitoring: Secondary | ICD-10-CM

## 2017-03-27 DIAGNOSIS — Z23 Encounter for immunization: Secondary | ICD-10-CM

## 2017-03-27 LAB — POCT INR: INR: 1.4

## 2017-03-27 NOTE — Patient Instructions (Signed)
Pre visit review using our clinic review tool, if applicable. No additional management support is needed unless otherwise documented below in the visit note. 

## 2017-04-24 ENCOUNTER — Ambulatory Visit (INDEPENDENT_AMBULATORY_CARE_PROVIDER_SITE_OTHER): Payer: 59

## 2017-04-24 DIAGNOSIS — Z86711 Personal history of pulmonary embolism: Secondary | ICD-10-CM

## 2017-04-24 DIAGNOSIS — Z5181 Encounter for therapeutic drug level monitoring: Secondary | ICD-10-CM

## 2017-04-24 LAB — POCT INR: INR: 2.3

## 2017-04-24 NOTE — Patient Instructions (Signed)
Pre visit review using our clinic review tool, if applicable. No additional management support is needed unless otherwise documented below in the visit note. 

## 2017-04-28 NOTE — Progress Notes (Signed)
Agree. Thanks

## 2017-05-16 ENCOUNTER — Telehealth: Payer: Self-pay

## 2017-05-16 DIAGNOSIS — Z7901 Long term (current) use of anticoagulants: Secondary | ICD-10-CM

## 2017-05-16 DIAGNOSIS — Z86711 Personal history of pulmonary embolism: Secondary | ICD-10-CM

## 2017-05-16 NOTE — Telephone Encounter (Signed)
Copied from [place] [id number]. Topic: Inquiry >> May 14, 2017  4:51 PM Oliver Pila B wrote: Reason for CRM: PT is coming in for labs on Dec, 4th @ 9:30 and is needing a lab order for blood work for a cpe, he also has later the same day a coumadin check, if possible also contact the PT to see if its okay for him to get everything done at the same time when PT arrives in the morning  >> May 14, 2017  5:03 PM Tammi Sou, [place] wrote: Per message pt wants coumadin done at lab appt in the AM, will rout to [facility] to see if this can be done

## 2017-05-16 NOTE — Telephone Encounter (Signed)
Spoke with patient via phone.  Will add a PT/INR to his venipuncture labs on 06/05/17 and I will call him with results and dose over the phone.  Coumadin clinic appt cancelled for that day.

## 2017-06-04 ENCOUNTER — Other Ambulatory Visit: Payer: Self-pay | Admitting: Family Medicine

## 2017-06-04 DIAGNOSIS — E7800 Pure hypercholesterolemia, unspecified: Secondary | ICD-10-CM

## 2017-06-04 DIAGNOSIS — L570 Actinic keratosis: Secondary | ICD-10-CM | POA: Diagnosis not present

## 2017-06-04 DIAGNOSIS — D225 Melanocytic nevi of trunk: Secondary | ICD-10-CM | POA: Diagnosis not present

## 2017-06-04 DIAGNOSIS — D6859 Other primary thrombophilia: Secondary | ICD-10-CM

## 2017-06-05 ENCOUNTER — Other Ambulatory Visit (INDEPENDENT_AMBULATORY_CARE_PROVIDER_SITE_OTHER): Payer: 59

## 2017-06-05 ENCOUNTER — Ambulatory Visit: Payer: 59

## 2017-06-05 DIAGNOSIS — E7800 Pure hypercholesterolemia, unspecified: Secondary | ICD-10-CM

## 2017-06-05 DIAGNOSIS — Z86711 Personal history of pulmonary embolism: Secondary | ICD-10-CM | POA: Diagnosis not present

## 2017-06-05 DIAGNOSIS — D6859 Other primary thrombophilia: Secondary | ICD-10-CM | POA: Diagnosis not present

## 2017-06-05 DIAGNOSIS — Z7901 Long term (current) use of anticoagulants: Secondary | ICD-10-CM

## 2017-06-05 LAB — COMPREHENSIVE METABOLIC PANEL
ALK PHOS: 47 U/L (ref 39–117)
ALT: 31 U/L (ref 0–53)
AST: 21 U/L (ref 0–37)
Albumin: 4.3 g/dL (ref 3.5–5.2)
BUN: 16 mg/dL (ref 6–23)
CO2: 30 mEq/L (ref 19–32)
Calcium: 9.2 mg/dL (ref 8.4–10.5)
Chloride: 105 mEq/L (ref 96–112)
Creatinine, Ser: 1.06 mg/dL (ref 0.40–1.50)
GFR: 77.46 mL/min (ref >60.00)
Glucose, Bld: 104 mg/dL — ABNORMAL HIGH (ref 70–99)
Potassium: 4.2 mEq/L (ref 3.5–5.1)
Sodium: 140 mEq/L (ref 135–145)
Total Bilirubin: 0.8 mg/dL (ref 0.2–1.2)
Total Protein: 7 g/dL (ref 6.0–8.3)

## 2017-06-05 LAB — CBC WITH DIFFERENTIAL/PLATELET
BASOS PCT: 1.2 % (ref 0.0–3.0)
Basophils Absolute: 0.1 10*3/uL (ref 0.0–0.1)
EOS ABS: 0.4 10*3/uL (ref 0.0–0.7)
Eosinophils Relative: 7.9 % — ABNORMAL HIGH (ref 0.0–5.0)
HCT: 48.7 % (ref 39.0–52.0)
HEMOGLOBIN: 16.3 g/dL (ref 13.0–17.0)
Lymphocytes Relative: 24.9 % (ref 12.0–46.0)
Lymphs Abs: 1.3 10*3/uL (ref 0.7–4.0)
MCHC: 33.5 g/dL (ref 30.0–36.0)
MCV: 89.7 fl (ref 78.0–100.0)
MONO ABS: 0.5 10*3/uL (ref 0.1–1.0)
Monocytes Relative: 9 % (ref 3.0–12.0)
NEUTROS PCT: 57 % (ref 43.0–77.0)
Neutro Abs: 3.1 10*3/uL (ref 1.4–7.7)
PLATELETS: 262 10*3/uL (ref 150.0–400.0)
RBC: 5.43 Mil/uL (ref 4.22–5.81)
RDW: 13.1 % (ref 11.5–15.5)
WBC: 5.4 10*3/uL (ref 4.0–10.5)

## 2017-06-05 LAB — LIPID PANEL
CHOLESTEROL: 251 mg/dL — AB (ref 0–200)
HDL: 50.3 mg/dL (ref >39.00)
LDL CALC: 165 mg/dL — AB (ref 0–99)
NonHDL: 200.23
Total CHOL/HDL Ratio: 5
Triglycerides: 175 mg/dL — ABNORMAL HIGH (ref 0.0–149.0)
VLDL: 35 mg/dL (ref 0.0–40.0)

## 2017-06-05 LAB — PROTIME-INR
INR: 2.3 ratio — AB (ref 0.8–1.0)
PROTHROMBIN TIME: 24.5 s — AB (ref 9.6–13.1)

## 2017-06-05 LAB — POCT INR: INR: 2.3

## 2017-06-06 ENCOUNTER — Ambulatory Visit: Payer: Self-pay

## 2017-06-06 DIAGNOSIS — Z5181 Encounter for therapeutic drug level monitoring: Secondary | ICD-10-CM

## 2017-06-06 DIAGNOSIS — Z86711 Personal history of pulmonary embolism: Secondary | ICD-10-CM

## 2017-06-06 NOTE — Patient Instructions (Signed)
INR today 2.3 per venipuncture labs.  Patient reports not following schedule since last eval and has been taking 1 (3mg ) tablet daily.  He is in range, so will have him continue taking same dose that he has been on and recheck him in 1 month.

## 2017-06-11 ENCOUNTER — Encounter: Payer: 59 | Admitting: Family Medicine

## 2017-06-11 NOTE — Progress Notes (Signed)
Agree. Thanks

## 2017-06-13 ENCOUNTER — Telehealth: Payer: Self-pay | Admitting: Family Medicine

## 2017-06-13 NOTE — Telephone Encounter (Addendum)
Can patient be worked in per his request?  Please advised lab results.

## 2017-06-13 NOTE — Telephone Encounter (Signed)
I called pt and scheduled pt 06/19/17. He is aware.

## 2017-06-13 NOTE — Telephone Encounter (Signed)
Copied from [place]. Topic: Appointment Scheduling - Prior Auth Required for Appointment >> Jun 13, 2017  3:27 PM Robina Ade, Helene Kelp D wrote: No appointment has been scheduled. Patient is requesting Annual physical appointment. Per scheduling protocol, this appointment requires a prior authorization prior to scheduling.  Route to department's PEC pool. Patient CPE was cancel due to weather and he really would like to have it done this month. He can do 21st in the AM and after the 27th. Patient also would like to talk about his labs with CMA, please call patient back, thanks.

## 2017-06-19 ENCOUNTER — Ambulatory Visit (INDEPENDENT_AMBULATORY_CARE_PROVIDER_SITE_OTHER): Payer: 59 | Admitting: Family Medicine

## 2017-06-19 ENCOUNTER — Encounter: Payer: Self-pay | Admitting: Family Medicine

## 2017-06-19 VITALS — BP 146/88 | HR 89 | Temp 98.7°F | Ht 71.0 in | Wt 260.2 lb

## 2017-06-19 DIAGNOSIS — Z0000 Encounter for general adult medical examination without abnormal findings: Secondary | ICD-10-CM

## 2017-06-19 DIAGNOSIS — D6859 Other primary thrombophilia: Secondary | ICD-10-CM

## 2017-06-19 NOTE — Patient Instructions (Addendum)
Ask urology for their input on PSA testing.   Thanks for your effort.  Take care.  Glad to see you.

## 2017-06-19 NOTE — Progress Notes (Signed)
CPE- See plan.  Routine anticipatory guidance given to patient.  See health maintenance.  The possibility exists that previously documented standard health maintenance information may have been brought forward from a previous encounter into this note.  If needed, that same information has been updated to reflect the current situation based on today's encounter.    Tetanus 2013  Flu already done.  PNA and shingles not due.  Living will d/w pt. Wife designated if patient were incapacitated.  Colonoscopy 2016  Prostate cancer screening and PSA options (with potential risks and benefits of testing vs not testing) were discussed along with recent recs/guidelines.  He deferred testing PSA at this point.  I asked him to talk with urology about screening in general.   Diet and exercise d/w pt. Weight is up in the meantime.  He has plans for diet and exercise, has lost weight prev.   H/o blood donation at [facility].   Diet and exercise d/w pt.  Labs d/w pt.   His mother has metastatic small cell lung cancer and is in the midst of treatment.  D/w pt.  He has been driving back and forth to [place] to see her and care for her.    He was prev on hcg/methyl/inositol 500IU/1mg /10mg  with a diet prev.  He had cut carbs at the time with higher protein content.  Weight is up. D/w pt.   He wanted to know if he can restart that med.  Deferred at [place] so I can check on this.    Still on anticoagulation per coumadin clinic.  Protein C deficiency.  No bruising, no bleeding.  Benefit of treatment with Coumadin likely greatly outweighs the risk of continued treatment.  He agrees.  PMH and SH reviewed  Meds, vitals, and allergies reviewed.   ROS: Per HPI.  Unless specifically indicated otherwise in HPI, the patient denies:  General: fever. Eyes: acute vision changes ENT: sore throat Cardiovascular: chest pain Respiratory: SOB GI: vomiting GU: dysuria Musculoskeletal: acute back pain Derm: acute  rash Neuro: acute motor dysfunction Psych: worsening mood Endocrine: polydipsia Heme: bleeding Allergy: hayfever  GEN: nad, alert and oriented HEENT: mucous membranes moist NECK: supple w/o LA CV: rrr. PULM: ctab, no inc wob ABD: soft, +bs EXT: no edema SKIN: no acute rash

## 2017-06-20 NOTE — Assessment & Plan Note (Signed)
Still on anticoagulation per coumadin clinic.  Protein C deficiency.  No bruising, no bleeding.  Benefit of treatment with Coumadin likely greatly outweighs the risk of continued treatment.  He agrees. 

## 2017-06-20 NOTE — Assessment & Plan Note (Signed)
Tetanus 2013  Flu already done.  PNA and shingles not due.  Living will d/w pt. Wife designated if patient were incapacitated.  Colonoscopy 2016  Prostate cancer screening and PSA options (with potential risks and benefits of testing vs not testing) were discussed along with recent recs/guidelines.  He deferred testing PSA at this point.  I asked him to talk with urology about screening in general.   Diet and exercise d/w pt. Weight is up in the meantime.  He has plans for diet and exercise, has lost weight prev.   H/o blood donation at [place].   Diet and exercise d/w pt.  Labs d/w pt.  The goal is for patient to work on weight with diet and exercise as much as his schedule will allow and then recheck labs periodically.  His mother has metastatic small cell lung cancer and is in the midst of treatment.  D/w pt.  He has been driving back and forth to [place] to see her and care for her.

## 2017-06-29 DIAGNOSIS — H524 Presbyopia: Secondary | ICD-10-CM | POA: Diagnosis not present

## 2017-07-06 ENCOUNTER — Telehealth: Payer: Self-pay | Admitting: Family Medicine

## 2017-07-06 NOTE — Telephone Encounter (Signed)
Patient advised.

## 2017-07-06 NOTE — Telephone Encounter (Signed)
Notify patient.  He was asking about a weight loss supplement.  It is likely useless, potentially harmful, and I would not take it.  Thanks.

## 2017-07-10 ENCOUNTER — Other Ambulatory Visit: Payer: Self-pay | Admitting: *Deleted

## 2017-07-10 MED ORDER — CELECOXIB 200 MG PO CAPS: ORAL_CAPSULE | ORAL | 3 refills | Status: DC

## 2017-07-12 ENCOUNTER — Ambulatory Visit (INDEPENDENT_AMBULATORY_CARE_PROVIDER_SITE_OTHER): Payer: 59 | Admitting: General Practice

## 2017-07-12 DIAGNOSIS — Z5181 Encounter for therapeutic drug level monitoring: Secondary | ICD-10-CM | POA: Diagnosis not present

## 2017-07-12 DIAGNOSIS — Z7901 Long term (current) use of anticoagulants: Secondary | ICD-10-CM

## 2017-07-12 DIAGNOSIS — Z86711 Personal history of pulmonary embolism: Secondary | ICD-10-CM

## 2017-07-12 LAB — POCT INR: INR: 2.1

## 2017-07-12 NOTE — Patient Instructions (Addendum)
Pre visit review using our clinic review tool, if applicable. No additional management support is needed unless otherwise documented below in the visit note.  Continue to take 1 tablet daily.  Re-check in 6 weeks.

## 2017-07-15 NOTE — Progress Notes (Signed)
Agree. Thanks

## 2017-08-22 DIAGNOSIS — G5762 Lesion of plantar nerve, left lower limb: Secondary | ICD-10-CM | POA: Diagnosis not present

## 2017-08-22 DIAGNOSIS — G5761 Lesion of plantar nerve, right lower limb: Secondary | ICD-10-CM | POA: Diagnosis not present

## 2017-08-22 DIAGNOSIS — Q828 Other specified congenital malformations of skin: Secondary | ICD-10-CM | POA: Diagnosis not present

## 2017-08-23 ENCOUNTER — Other Ambulatory Visit: Payer: Self-pay | Admitting: Family Medicine

## 2017-08-23 ENCOUNTER — Ambulatory Visit: Payer: 59

## 2017-08-29 ENCOUNTER — Telehealth: Payer: Self-pay | Admitting: Family Medicine

## 2017-08-29 ENCOUNTER — Other Ambulatory Visit: Payer: Self-pay | Admitting: Family Medicine

## 2017-08-29 NOTE — Telephone Encounter (Signed)
Copied from [place]. Topic: Quick Communication - See Telephone Encounter >> Aug 29, 2017  2:39 PM Ivar Drape wrote: CRM for notification. See Telephone encounter for:  Patient would like a refill on his COUMADIN 3 MG tablet medication.  He has already missed one day, so he is completely out.  Please send the prescription to his new preferred pharmacy: [facility] on [address].  Ph: [phone]  08/29/17.

## 2017-08-30 ENCOUNTER — Other Ambulatory Visit: Payer: Self-pay | Admitting: General Practice

## 2017-08-30 MED ORDER — COUMADIN 3 MG PO TABS: ORAL_TABLET | ORAL | 0 refills | Status: DC

## 2017-08-30 NOTE — Telephone Encounter (Signed)
LOV 12/018/18 with Dr. Damita Dunnings / Last INR on 07/12/2017  Refill request for Coumadin / Protocol states to send to practice if they have a coumadin clinic /

## 2017-09-03 NOTE — Telephone Encounter (Signed)
Rx refill approved and sent in on 2/28 for #105 per Dr. Damita Dunnings.  Thanks.

## 2017-09-05 NOTE — Progress Notes (Signed)
12:08 PM   Ronald Hatfield 1965-12-16 [id number]  Referring provider: Tonia Ghent, MD [address] [place], [place] [address]  Chief Complaint  Patient presents with  . Benign Prostatic Hypertrophy  . Erectile Dysfunction  . Hematuria  . Follow-up    HPI: 52 yo WM with ED, testosterone deficiency and BPH with LU TS who presents today for a one year follow up.  Erectile dysfunction His SHIM score is 14, which is mild to moderate ED.   He has been having difficulty with erections for five years.   His major complaint is maintaining the erections.  His libido is preserved.   His risk factors for ED are age, BPH, testosterone deficiency, HLD and anticoagulation.  He denies any painful erections or curvatures with his erections.   He is no longer having spontaneous erections.  He has tried PDE5-inhibitors in the past and they have been effective.[name]   SHIM    Row Name 09/06/17 1111         SHIM: Over the last 6 months:   How do you rate your confidence that you could get and keep an erection?  Moderate     When you had erections with sexual stimulation, how often were your erections hard enough for penetration (entering your partner)?  Most Times (much more than half the time)     During sexual intercourse, how often were you able to maintain your erection after you had penetrated (entered) your partner?  Sometimes (about half the time)     During sexual intercourse, how difficult was it to maintain your erection to completion of intercourse?  Very Difficult     When you attempted sexual intercourse, how often was it satisfactory for you?  A Few Times (much less than half the time)       SHIM Total Score   SHIM  14        Score: 1-7 Severe ED 8-11 Moderate ED 12-16 Mild-Moderate ED 17-21 Mild ED 22-25 No ED   Testosterone deficiency Patient would like to be considered for testosterone therapy as he is suffering from fatigue.    BPH WITH LUTS His IPSS score today  is 9, which is moderate lower urinary tract symptomatology.  He is mostly mixed with his quality life due to his urinary symptoms.  His major complaint today is intermittency.  He has had these symptoms for the last few years.  He denies any dysuria, hematuria or suprapubic pain.  He was taking Cialis 5 mg daily, but he is in need of a new prescription.  He also denies any recent fevers, chills, nausea or vomiting.  He does not have a family history of PCa. IPSS    Row Name 09/06/17 1100         International Prostate Symptom Score   How often have you had the sensation of not emptying your bladder?  Less than 1 in 5     How often have you had to urinate less than every two hours?  Less than 1 in 5 times     How often have you found you stopped and started again several times when you urinated?  Less than 1 in 5 times     How often have you found it difficult to postpone urination?  Less than 1 in 5 times     How often have you had a weak urinary stream?  About half the time     How often have  you had to strain to start urination?  Not at All     How many times did you typically get up at night to urinate?  2 Times     Total IPSS Score  9       Quality of Life due to urinary symptoms   If you were to spend the rest of your life with your urinary condition just the way it is now how would you feel about that?  Mixed        Score:  1-7 Mild 8-19 Moderate 20-35 Severe  Abnormal colored urine Patient may or may not of had blood in his urine after a trip from the [place].  He stated his INR's were elevated and once this was corrected the urine returned to normal.  He was advised to undergo a hematuria work up at that time, but he declined.  His UA from 08/2016 and today (09/06/2017) had no AMH.  He does not report any gross hematuria.  Urine has been yellow.    PMH: Past Medical History:  Diagnosis Date  . Attention deficit disorder without mention of hyperactivity   . Depressive  disorder, not elsewhere classified   . Dermatophytosis of the body   . Encounter for therapeutic drug monitoring   . Impotence of organic origin   . 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 Long term (current) use of anticoagulants   . Other abnormal glucose   . Other pulmonary embolism and infarction    Hospitalization 09/12/08-09/16/08  . Overweight(278.02)   . Pure hypercholesterolemia   . RLS (restless legs syndrome)   . Routine general medical examination at a health care facility   . Unspecified viral infection, in conditions classified elsewhere and of unspecified site     Surgical History: Past Surgical History:  Procedure Laterality Date  . LIPOMA EXCISION  1994   Right clavicular head  . VASECTOMY  02/07/02   Dr. Council Mechanic    Home Medications:  Allergies as of 09/06/2017   No Known Allergies     Medication List        Accurate as of 09/06/17 12:08 PM. Always use your most recent med list.          celecoxib 200 MG capsule Commonly known as:  CELEBREX TAKE 1 CAPSULE ONCE DAILY WITH FOOD   celecoxib 200 MG capsule Commonly known as:  CELEBREX TAKE 1 CAPSULE ONCE DAILY  WITH FOOD   cetirizine 10 MG tablet Commonly known as:  ZYRTEC Take 1 tablet (10 mg total) by mouth daily.   COUMADIN 3 MG tablet Generic drug:  warfarin Take as directed by the anticoagulation clinic. If you are unsure how to take this medication, talk to your nurse or doctor. Original instructions:  Take 1 tablet daily or TAKE AS DIRECTED BY ANTI-COAGULATION CLINIC   tadalafil 5 MG tablet Commonly known as:  CIALIS Take 1 tablet (5 mg total) by mouth daily.   tadalafil 20 MG tablet Commonly known as:  CIALIS Take 1 tablet (20 mg total) by mouth daily as needed for erectile dysfunction.   valACYclovir 1000 MG tablet Commonly known as:  VALTREX Take 2 tablets (2,000 mg total) by mouth 2 (two) times daily as needed (for one day).       Allergies:  No Known Allergies  Family History: Family History  Problem Relation Age of Onset  . Alcohol abuse Father   . Deep vein thrombosis Father  h/o phlebitis  . Deep vein thrombosis Mother   . Skin cancer Mother   . Cancer Mother   . Deep vein thrombosis Sister   . Alzheimer's disease Paternal Grandmother   . Alcohol abuse Maternal Grandfather   . Diabetes Maternal Grandmother   . Coronary artery disease Maternal Grandmother   . Stroke Maternal Grandmother   . Prostate cancer Neg Hx   . Colon cancer Neg Hx     Social History:  reports that  has never smoked. he has never used smokeless tobacco. He reports that he drinks alcohol. He reports that he does not use drugs.  ROS: UROLOGY Frequent Urination?: No Hard to postpone urination?: No Burning/pain with urination?: No Get up at night to urinate?: No Leakage of urine?: No Urine stream starts and stops?: No Trouble starting stream?: No Do you have to strain to urinate?: No Blood in urine?: No Urinary tract infection?: No Sexually transmitted disease?: No Injury to kidneys or bladder?: No Painful intercourse?: No Weak stream?: No Erection problems?: Yes Penile pain?: No  Gastrointestinal Nausea?: No Vomiting?: No Indigestion/heartburn?: No Diarrhea?: No Constipation?: No  Constitutional Fever: No Night sweats?: No Weight loss?: No Fatigue?: No  Skin Skin rash/lesions?: No Itching?: No  Eyes Blurred vision?: No Double vision?: No  Ears/Nose/Throat Sore throat?: No Sinus problems?: No  Hematologic/Lymphatic Swollen glands?: No Easy bruising?: Yes  Cardiovascular Leg swelling?: No Chest pain?: No  Respiratory Cough?: No Shortness of breath?: No  Endocrine Excessive thirst?: No  Musculoskeletal Back pain?: No Joint pain?: No  Neurological Headaches?: No Dizziness?: No  Psychologic Depression?: No Anxiety?: No  Physical Exam: BP (!) 148/107   Pulse 69   Ht 5\' 11"  (1.803 m)    Wt 260 lb (117.9 kg)   BMI 36.26 kg/m   Constitutional: Well nourished. Alert and oriented, No acute distress. HEENT: [place] AT, moist mucus membranes. Trachea midline, no masses. Cardiovascular: No clubbing, cyanosis, or edema. Respiratory: Normal respiratory effort, no increased work of breathing. GI: Abdomen is soft, non tender, non distended, no abdominal masses. Liver and spleen not palpable.  No hernias appreciated.  Stool sample for occult testing is not indicated.   GU: No CVA tenderness.  No bladder fullness or masses.  Patient with circumcised phallus.  Urethral meatus is patent.  No penile discharge. No penile lesions or rashes. Scrotum without lesions, cysts, rashes and/or edema.  Testicles are located scrotally bilaterally. No masses are appreciated in the testicles. Left and right epididymis are normal. Rectal: Patient with  normal sphincter tone. Anus and perineum without scarring or rashes. No rectal masses are appreciated. Prostate is approximately 55 grams, no nodules are appreciated. Seminal vesicles are normal. Skin: No rashes, bruises or suspicious lesions. Lymph: No cervical or inguinal adenopathy. Neurologic: Grossly intact, no focal deficits, moving all 4 extremities. Psychiatric: Normal mood and affect.   Laboratory Data: PSA History  0.66 ng/mL on 06/13/2014  0.90 ng/mL on 06/14/2015  0.77 ng/mL on 06/13/2016  0.70 ng/mL on 09/06/2017    Results for orders placed or performed in visit on 07/12/17  POCT INR  Result Value Ref Range   INR 2.1     Assessment & Plan:    1. Erectile dysfunction  - SHIM score is 14, it is improved  - Continue Cialis 20 mg on demand dosing; refills given  - RTC in 12 months for repeat SHIM score and exam   2. BPH with LUTS  - IPSS score is   - Continue  conservative management, avoiding bladder irritants and timed voiding's  - Continue Cialis 5 mg daily; refills given  - RTC in 12 months for I PSS and exam   3. History of  hematuria  - UA - AMH not present on today's sample  - patient encouraged to report any gross hematuria  4. Testosterone deficiency Patient states he is really having issues with fatigue and would like to see if he is a candidate for testosterone therapy I explained that there is conflicting data regarding the risks of testosterone therapy being associated with the formation of clots and with his Protein C deficiency, I would like to get the input of a hematologist regarding their thoughts about testosterone therapy  Refer to hematology     Return for pending labs.  These notes generated with voice recognition software. I apologize for typographical errors.  Zara Council, [facility] [address] [place], [place] [address] [phone]

## 2017-09-06 ENCOUNTER — Ambulatory Visit: Payer: 59 | Admitting: Urology

## 2017-09-06 ENCOUNTER — Encounter: Payer: Self-pay | Admitting: Urology

## 2017-09-06 VITALS — BP 148/107 | HR 69 | Ht 71.0 in | Wt 260.0 lb

## 2017-09-06 DIAGNOSIS — N400 Enlarged prostate without lower urinary tract symptoms: Secondary | ICD-10-CM | POA: Diagnosis not present

## 2017-09-06 DIAGNOSIS — R310 Gross hematuria: Secondary | ICD-10-CM | POA: Diagnosis not present

## 2017-09-06 DIAGNOSIS — E349 Endocrine disorder, unspecified: Secondary | ICD-10-CM | POA: Diagnosis not present

## 2017-09-06 DIAGNOSIS — N529 Male erectile dysfunction, unspecified: Secondary | ICD-10-CM

## 2017-09-06 DIAGNOSIS — N138 Other obstructive and reflux uropathy: Secondary | ICD-10-CM

## 2017-09-06 DIAGNOSIS — Z87448 Personal history of other diseases of urinary system: Secondary | ICD-10-CM | POA: Diagnosis not present

## 2017-09-06 DIAGNOSIS — N401 Enlarged prostate with lower urinary tract symptoms: Secondary | ICD-10-CM

## 2017-09-06 MED ORDER — TADALAFIL 5 MG PO TABS: 5.0000 mg | ORAL_TABLET | Freq: Every day | ORAL | 3 refills | Status: DC

## 2017-09-06 MED ORDER — TADALAFIL 20 MG PO TABS: 20.0000 mg | ORAL_TABLET | Freq: Every day | ORAL | 6 refills | Status: DC | PRN

## 2017-09-07 ENCOUNTER — Encounter: Payer: Self-pay | Admitting: Family Medicine

## 2017-09-07 ENCOUNTER — Telehealth: Payer: Self-pay

## 2017-09-07 ENCOUNTER — Encounter: Payer: Self-pay | Admitting: Urology

## 2017-09-07 DIAGNOSIS — D6859 Other primary thrombophilia: Secondary | ICD-10-CM

## 2017-09-07 LAB — MICROSCOPIC EXAMINATION
BACTERIA UA: NONE SEEN
EPITHELIAL CELLS (NON RENAL): NONE SEEN /HPF (ref 0–10)
RBC MICROSCOPIC, UA: NONE SEEN /HPF (ref 0–?)
WBC, UA: NONE SEEN /hpf (ref 0–?)

## 2017-09-07 LAB — URINALYSIS, COMPLETE
Bilirubin, UA: NEGATIVE
GLUCOSE, UA: NEGATIVE
Leukocytes, UA: NEGATIVE
Nitrite, UA: NEGATIVE
Protein, UA: NEGATIVE
RBC, UA: NEGATIVE
Urobilinogen, Ur: 0.2 mg/dL (ref 0.2–1.0)
pH, UA: 5.5 (ref 5.0–7.5)

## 2017-09-07 LAB — HEMATOCRIT: HEMATOCRIT: 45.2 % (ref 37.5–51.0)

## 2017-09-07 LAB — PSA: Prostate Specific Ag, Serum: 0.7 ng/mL (ref 0.0–4.0)

## 2017-09-07 LAB — TESTOSTERONE: TESTOSTERONE: 299 ng/dL (ref 264–916)

## 2017-09-07 LAB — HEMOGLOBIN: HEMOGLOBIN: 16.1 g/dL (ref 13.0–17.7)

## 2017-09-07 NOTE — Telephone Encounter (Signed)
Referral placed.

## 2017-09-07 NOTE — Telephone Encounter (Signed)
-----   Message from Nori Riis, PA-C sent at 09/07/2017  7:50 AM EST ----- I have spoken to the patient concerning his lab work.  He is wanting to start testosterone therapy, but he has a protein C deficiency and we need to refer him to hematology to get clearance to start therapy.

## 2017-09-08 ENCOUNTER — Encounter: Payer: Self-pay | Admitting: Gastroenterology

## 2017-09-08 NOTE — Telephone Encounter (Signed)
ERROR

## 2017-09-13 ENCOUNTER — Ambulatory Visit: Payer: 59 | Admitting: General Practice

## 2017-09-13 ENCOUNTER — Inpatient Hospital Stay: Payer: 59 | Attending: Oncology | Admitting: Oncology

## 2017-09-13 ENCOUNTER — Encounter: Payer: Self-pay | Admitting: Oncology

## 2017-09-13 ENCOUNTER — Other Ambulatory Visit: Payer: Self-pay

## 2017-09-13 VITALS — BP 150/99 | HR 99 | Temp 98.2°F | Wt 257.3 lb

## 2017-09-13 DIAGNOSIS — I2782 Chronic pulmonary embolism: Secondary | ICD-10-CM | POA: Diagnosis not present

## 2017-09-13 DIAGNOSIS — Z7901 Long term (current) use of anticoagulants: Secondary | ICD-10-CM | POA: Diagnosis not present

## 2017-09-13 DIAGNOSIS — Z86711 Personal history of pulmonary embolism: Secondary | ICD-10-CM

## 2017-09-13 LAB — POCT INR: INR: 2.5

## 2017-09-13 NOTE — Progress Notes (Signed)
Patient here today as a new patient  

## 2017-09-13 NOTE — Patient Instructions (Addendum)
Pre visit review using our clinic review tool, if applicable. No additional management support is needed unless otherwise documented below in the visit note.  Continue to take 1 tablet daily.  Re-check in 6 weeks.  

## 2017-09-13 NOTE — Progress Notes (Signed)
Hematology/Oncology Consult note [facility] Telephone:([phone] [phone]   Patient Care Team: Tonia Ghent, MD as PCP - General (Family Medicine)  REFERRING PROVIDER: Urology Zara Council CHIEF COMPLAINTS/PURPOSE OF CONSULTATION:  Evaluation of protein C deficiency.   HISTORY OF PRESENTING ILLNESS:  Ronald Hatfield is a  52 y.o.  male with PMH listed below who was referred to me for evaluation of protein C deficiency.  Patient reports having a history of unprovoked pulmonary embolism in 2010 and he has been on Coumadin was monitoring of INR since then.  His INR has been very stable.  He follows up with Dr. Damita Dunnings for management of anticoagulation.  He denies any easy bruising, bleeding events.  In 2015, he has a hypercoagulable workup panel done which showed decreased total protein C at 43% (ref 72-160%), protein C activity was at 18%.  Protein S activity 64%.  Nnegative for factor V Leiden mutation, prothrombin mutation, lupus anticoagulant, anti-cardiolipin antibodies, He recently went to see urology [facility] for difficulty in erections for the past 5 years.  He has tried PDE 5 inhibitors in the past had been effective.  Testosterone was checked and was at the low normal limits to 299. Patient desired to start testosterone replacement and there was concern about whether this will interfere with his anticoagulation will increase his risk of DVT risk. Patient reports feeling well at baseline.  No other complaints. Review of Systems  Constitutional: Negative for chills, fever, malaise/fatigue and weight loss.  HENT: Negative for hearing loss and nosebleeds.   Eyes: Negative for double vision and photophobia.  Respiratory: Negative for cough and sputum production.   Cardiovascular: Negative for chest pain, palpitations and claudication.  Gastrointestinal: Negative for abdominal pain, heartburn and nausea.  Genitourinary: Negative for dysuria and  frequency.  Musculoskeletal: Negative for myalgias.  Skin: Negative for rash.  Neurological: Negative for dizziness and headaches.  Endo/Heme/Allergies: Does not bruise/bleed easily.  Psychiatric/Behavioral: Negative for depression and suicidal ideas. The patient is not nervous/anxious.     MEDICAL HISTORY:  Past Medical History:  Diagnosis Date  . Attention deficit disorder without mention of hyperactivity   . Depressive disorder, not elsewhere classified   . Dermatophytosis of the body   . Encounter for therapeutic drug monitoring   . History of DVT (deep vein thrombosis)   . Impotence of organic origin   . 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 Long term (current) use of anticoagulants   . Other abnormal glucose   . Other pulmonary embolism and infarction    Hospitalization 09/12/08-09/16/08  . Overweight(278.02)   . Pure hypercholesterolemia   . RLS (restless legs syndrome)   . Routine general medical examination at a health care facility   . Unspecified viral infection, in conditions classified elsewhere and of unspecified site     SURGICAL HISTORY: Past Surgical History:  Procedure Laterality Date  . LIPOMA EXCISION  1994   Right clavicular head  . VASECTOMY  02/07/02   Dr. Council Mechanic    SOCIAL HISTORY: Social History   Socioeconomic History  . Marital status: Married    Spouse name: Not on file  . Number of children: 2  . Years of education: Not on file  . Highest education level: Not on file  Social Needs  . Financial resource strain: Not on file  . Food insecurity - worry: Not on file  . Food insecurity - inability: Not on file  . Transportation needs -  medical: Not on file  . Transportation needs - non-medical: Not on file  Occupational History  . Occupation: Mining engineer since 2007  Tobacco Use  . Smoking status: Never Smoker  . Smokeless tobacco: Former Systems developer    Types: Chew  Substance and Sexual Activity    . Alcohol use: Yes    Comment: occasional  . Drug use: No  . Sexual activity: Yes  Other Topics Concern  . Not on file  Social History Narrative   Married 1993   Children: 2 CHILDREN    Occupation: Pharmacist, community since 2007, ([employer]   Likes to play [place] grad    FAMILY HISTORY: Family History  Problem Relation Age of Onset  . Alcohol abuse Father   . Deep vein thrombosis Father        h/o phlebitis  . Deep vein thrombosis Mother   . Skin cancer Mother   . Lung cancer Mother   . Deep vein thrombosis Sister   . Alzheimer's disease Paternal Grandmother   . Alcohol abuse Maternal Grandfather   . Diabetes Maternal Grandmother   . Coronary artery disease Maternal Grandmother   . Stroke Maternal Grandmother   . Prostate cancer Neg Hx   . Colon cancer Neg Hx     ALLERGIES:  has No Known Allergies.  MEDICATIONS:  Current Outpatient Medications  Medication Sig Dispense Refill  . celecoxib (CELEBREX) 200 MG capsule TAKE 1 CAPSULE ONCE DAILY  WITH FOOD 90 capsule 3  . cetirizine (ZYRTEC) 10 MG tablet Take 1 tablet (10 mg total) by mouth daily. 90 tablet 3  . Chorionic Gonadotropin POWD by Does not apply route. Sublingual    . COUMADIN 3 MG tablet Take 1 tablet daily or TAKE AS DIRECTED BY ANTI-COAGULATION CLINIC 105 tablet 0  . magnesium oxide (MAG-OX) 400 MG tablet Take 600 mg by mouth daily.     . Pyridoxine HCl (VITAMIN B-6 PO) Take by mouth.    . tadalafil (CIALIS) 20 MG tablet Take 1 tablet (20 mg total) by mouth daily as needed for erectile dysfunction. 10 tablet 6  . tadalafil (CIALIS) 5 MG tablet Take 1 tablet (5 mg total) by mouth daily. 90 tablet 3  . valACYclovir (VALTREX) 1000 MG tablet Take 2 tablets (2,000 mg total) by mouth 2 (two) times daily as needed (for one day). 20 tablet 12   No current facility-administered medications for this visit.      PHYSICAL EXAMINATION: ECOG PERFORMANCE STATUS: 0 -  Asymptomatic Vitals:   09/13/17 1409  BP: (!) 150/99  Pulse: 99  Temp: 98.2 F (36.8 C)   Filed Weights   09/13/17 1409  Weight: 257 lb 5 oz (116.7 kg)    Physical Exam  Constitutional: He is oriented to person, place, and time and well-developed, well-nourished, and in no distress. No distress.  HENT:  Head: Normocephalic and atraumatic.  Mouth/Throat: Oropharynx is clear and moist. No oropharyngeal exudate.  Eyes: Conjunctivae and EOM are normal. Pupils are equal, round, and reactive to light. No scleral icterus.  Neck: Normal range of motion. Neck supple. No JVD present.  Cardiovascular: Normal rate and regular rhythm.  No murmur heard. Pulmonary/Chest: Effort normal and breath sounds normal.  Abdominal: Soft. Bowel sounds are normal. There is no rebound and no guarding.  Musculoskeletal: Normal range of motion. He exhibits no edema.  Lymphadenopathy:    He has no cervical adenopathy.  Neurological: He is alert and oriented to person, place,  and time. No cranial nerve deficit.  Skin: Skin is warm and dry.  Psychiatric: Affect and judgment normal.     LABORATORY DATA:  I have reviewed the data as listed Lab Results  Component Value Date   WBC 5.4 06/05/2017   HGB 16.1 09/06/2017   HCT 45.2 09/06/2017   MCV 89.7 06/05/2017   PLT 262.0 06/05/2017   Recent Labs    06/05/17 1001  NA 140  K 4.2  CL 105  CO2 30  GLUCOSE 104*  BUN 16  CREATININE 1.06  CALCIUM 9.2  PROT 7.0  ALBUMIN 4.3  AST 21  ALT 31  ALKPHOS 47  BILITOT 0.8       ASSESSMENT & PLAN:  1. Other chronic pulmonary embolism without acute cor pulmonale (HCC)   2. Current use of long term anticoagulation    Discussed with patient that protein S and C activity can be decreased why he takes Coumadin.  I doubt he really has protein C or S deficiency. Patient who has protein C or S deficiency usually developed skin necrosis when the Coumadin is initiated.  However patient appears to tolerate  Coumadin very well. I agree with long-term anticoagulation due to the history of unprovoked pulmonary embolism.  Patient asked if other anticoagulation options are feasible, discussed with him that switching to other anticoagulant options are reasonable for convenience/patient preference.  Since he has been doing so well on Coumadin he can also continue on the same and tach ablation option.  Regarding to testosterone replacement, testosterone replacement is associated with a increase of VTE usually within the 6 months after initiation and then the risk of a decline.  However patient has been on anticoagulation which should continue to prevent thrombosis events. If patient desires testosterone supplementation, I suggest start with the lowest dose and monitor CBC counts   2 weeks after.  Testosterone can potentially increase RBC mass/hemoglobin, which increases risk of thrombosis.  All questions were answered. The patient knows to call the clinic with any problems questions or concerns.  Return of visit: CBC in 3 weeks, follow-up in 3 months with repeat CBC. Thank you for this kind referral and the opportunity to participate in the care of this patient. A copy of today's note is routed to referring provider    Earlie Server, MD, PhD Hematology Oncology [facility] Pager- [phone] 09/13/2017

## 2017-09-16 NOTE — Progress Notes (Signed)
Agree. Thanks

## 2017-09-18 ENCOUNTER — Telehealth: Payer: Self-pay | Admitting: Urology

## 2017-09-18 ENCOUNTER — Encounter: Payer: Self-pay | Admitting: Urology

## 2017-09-18 DIAGNOSIS — E349 Endocrine disorder, unspecified: Secondary | ICD-10-CM

## 2017-09-18 NOTE — Telephone Encounter (Signed)
We need to complete more blood work prior to starting any testosterone therapy.  I need to have a morning testosterone before 10 AM, LFT's, FSH/LH and prolactin drawn.

## 2017-09-18 NOTE — Telephone Encounter (Signed)
Pt called office pt saw Dr. Hope Budds and states that Dr. Hope Budds requesting shannon to give pt testosterone therapy, pt would like to have Rx for testosterone and to contact him when the Rx is ready.  Please advise. Thanks.

## 2017-09-20 NOTE — Telephone Encounter (Signed)
Spoke with pt in reference needing labs prior to testosterone therapy being given. Pt voiced understanding. Lab appt made and orders placed.

## 2017-09-24 ENCOUNTER — Other Ambulatory Visit: Payer: 59

## 2017-09-24 DIAGNOSIS — E349 Endocrine disorder, unspecified: Secondary | ICD-10-CM | POA: Diagnosis not present

## 2017-09-25 ENCOUNTER — Telehealth: Payer: Self-pay | Admitting: Family Medicine

## 2017-09-25 LAB — FSH/LH
FSH: 4.7 m[IU]/mL (ref 1.5–12.4)
LH: 6.4 m[IU]/mL (ref 1.7–8.6)

## 2017-09-25 LAB — HEPATIC FUNCTION PANEL
ALBUMIN: 4.4 g/dL (ref 3.5–5.5)
ALT: 36 IU/L (ref 0–44)
AST: 30 IU/L (ref 0–40)
Alkaline Phosphatase: 49 IU/L (ref 39–117)
Bilirubin Total: 1 mg/dL (ref 0.0–1.2)
Bilirubin, Direct: 0.29 mg/dL (ref 0.00–0.40)
Total Protein: 7 g/dL (ref 6.0–8.5)

## 2017-09-25 LAB — PROLACTIN: Prolactin: 25.2 ng/mL — ABNORMAL HIGH (ref 4.0–15.2)

## 2017-09-25 LAB — TESTOSTERONE: TESTOSTERONE: 274 ng/dL (ref 264–916)

## 2017-09-25 NOTE — Telephone Encounter (Signed)
-----   Message from Nori Riis, PA-C sent at 09/25/2017  8:01 AM EDT ----- Patient's prolactin is elevated.  We need to repeat this test.  If it remains elevated, we need to schedule a MRI of his pituitary gland and refer to endocrinologist.

## 2017-09-25 NOTE — Telephone Encounter (Signed)
Left message for patient to return call.

## 2017-09-25 NOTE — Telephone Encounter (Signed)
I am not very familiar with hcg as I do not prescribe the medication.  I do know that clinical studies have not shown it to be more effective than placebo for weight loss and if you have a pituitary tumor, hcg is contraindicated.  I know that an elevated prolactin level can be a sign of a pituitary tumor.   I suggest referral to an endocrinologist at this time.

## 2017-09-25 NOTE — Telephone Encounter (Signed)
Patient notified and is wanting to know if him using HCG may be the cause of the increase in theProlactin level. He has been doing the injections for 3 weeks and has 3 more weeks to go until he is done with his 6 week course for weight loss. He wants to know if he should weight 3 more weeks before rechecking the lab when he is finished with HCG.

## 2017-09-28 NOTE — Telephone Encounter (Signed)
Spoke w/ pt, informed him of your notes, pt states understanding and will proceed with endocrinology referral.

## 2017-10-01 ENCOUNTER — Other Ambulatory Visit: Payer: Self-pay | Admitting: Urology

## 2017-10-01 DIAGNOSIS — E221 Hyperprolactinemia: Secondary | ICD-10-CM

## 2017-10-01 NOTE — Telephone Encounter (Signed)
No there is not a referral in his chart for endocrinologist   Ronald Hatfield

## 2017-10-01 NOTE — Telephone Encounter (Signed)
-----   Message from Nori Riis, PA-C sent at 09/30/2017  8:14 PM EDT ----- Would you look and see if the referral to an endocrinologist is in his chart?

## 2017-10-01 NOTE — Progress Notes (Unsigned)
Referral to endocrinology is in the chart.

## 2017-10-02 ENCOUNTER — Telehealth: Payer: Self-pay | Admitting: Oncology

## 2017-10-02 NOTE — Telephone Encounter (Signed)
Yes the referral to endo is in  [place]

## 2017-10-03 NOTE — Telephone Encounter (Signed)
The referral wasn't put in until 10-01-17 and that's when I sent it. They should be calling him soon to schedule, so no he hasn't had his appointment yet.   Sharyn Lull

## 2017-10-04 ENCOUNTER — Inpatient Hospital Stay: Payer: 59

## 2017-10-25 ENCOUNTER — Ambulatory Visit (INDEPENDENT_AMBULATORY_CARE_PROVIDER_SITE_OTHER): Payer: 59 | Admitting: General Practice

## 2017-10-25 DIAGNOSIS — Z7901 Long term (current) use of anticoagulants: Secondary | ICD-10-CM

## 2017-10-25 DIAGNOSIS — Z86711 Personal history of pulmonary embolism: Secondary | ICD-10-CM

## 2017-10-25 LAB — POCT INR: INR: 2.4

## 2017-10-25 NOTE — Patient Instructions (Addendum)
Pre visit review using our clinic review tool, if applicable. No additional management support is needed unless otherwise documented below in the visit note.  Continue to take 1 tablet daily.  Re-check in 6 weeks.  

## 2017-10-28 NOTE — Progress Notes (Signed)
Agree. Thanks

## 2017-11-05 ENCOUNTER — Encounter: Payer: Self-pay | Admitting: Endocrinology

## 2017-11-05 ENCOUNTER — Ambulatory Visit: Payer: 59 | Admitting: Endocrinology

## 2017-11-05 VITALS — BP 146/82 | HR 74 | Wt 233.4 lb

## 2017-11-05 DIAGNOSIS — E221 Hyperprolactinemia: Secondary | ICD-10-CM | POA: Diagnosis not present

## 2017-11-05 DIAGNOSIS — Z125 Encounter for screening for malignant neoplasm of prostate: Secondary | ICD-10-CM | POA: Diagnosis not present

## 2017-11-05 LAB — PSA: PSA: 0.71 ng/mL (ref 0.10–4.00)

## 2017-11-05 LAB — T4, FREE: FREE T4: 0.7 ng/dL (ref 0.60–1.60)

## 2017-11-05 LAB — TSH: TSH: 2.58 u[IU]/mL (ref 0.35–4.50)

## 2017-11-05 NOTE — Patient Instructions (Addendum)
Your blood pressure is high today.  Please see your primary care provider soon, to have it rechecked.   blood tests are requested for you today.  We'll let you know about the results.    

## 2017-11-05 NOTE — Progress Notes (Signed)
Subjective:    Patient ID: Ronald Hatfield, male    DOB: 1965/07/22, 52 y.o.   MRN: [id number]  HPI Pt is referred by Dr Damita Dunnings, for hyperprolactinemia.  he was noted to have an elevated prolactin in early 2019.  he denies the following: excessive exercise, opiates, antipsychotics, brain XRT, brain surgery, cirrhosis,  thyroid dz, seizures, liver dz, infertility, renal dz, chest wall injury.  Pt reports he had puberty at the normal age.  He has 2 biological children.  He took androgen rx x 6 weeks at age 69.  He has never been on any prescribed medication for hypogonadism.  He does not take antiandrogens.  He has no h/o sleep apnea.  He had PE in 2010.   He does not consume alcohol excessively.  He took SL-hCG x 6 weeks, for weight loss.  He stopped 2 weeks ago.  He has slight "restlessness" of the legs, and assoc cramps.   Past Medical History:  Diagnosis Date  . Attention deficit disorder without mention of hyperactivity   . Depressive disorder, not elsewhere classified   . Dermatophytosis of the body   . Encounter for therapeutic drug monitoring   . History of DVT (deep vein thrombosis)   . Impotence of organic origin   . 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 Long term (current) use of anticoagulants   . Other abnormal glucose   . Other pulmonary embolism and infarction    Hospitalization 09/12/08-09/16/08  . Overweight(278.02)   . Pure hypercholesterolemia   . RLS (restless legs syndrome)   . Routine general medical examination at a health care facility   . Unspecified viral infection, in conditions classified elsewhere and of unspecified site     Past Surgical History:  Procedure Laterality Date  . LIPOMA EXCISION  1994   Right clavicular head  . VASECTOMY  02/07/02   Dr. Council Mechanic    Social History   Socioeconomic History  . Marital status: Married    Spouse name: Not on file  . Number of children: 2  . Years of education: Not on file    . Highest education level: Not on file  Occupational History  . Occupation: Mining engineer since 2007  Social Needs  . Financial resource strain: Not on file  . Food insecurity:    Worry: Not on file    Inability: Not on file  . Transportation needs:    Medical: Not on file    Non-medical: Not on file  Tobacco Use  . Smoking status: Never Smoker  . Smokeless tobacco: Former Systems developer    Types: Chew  Substance and Sexual Activity  . Alcohol use: Yes    Comment: occasional  . Drug use: No  . Sexual activity: Yes  Lifestyle  . Physical activity:    Days per week: Not on file    Minutes per session: Not on file  . Stress: Not on file  Relationships  . Social connections:    Talks on phone: Not on file    Gets together: Not on file    Attends religious service: Not on file    Active member of club or organization: Not on file    Attends meetings of clubs or organizations: Not on file    Relationship status: Not on file  . Intimate partner violence:    Fear of current or ex partner: Not on file    Emotionally abused: Not on file  Physically abused: Not on file    Forced sexual activity: Not on file  Other Topics Concern  . Not on file  Social History Narrative   Married 1993   Children: 2 CHILDREN    Occupation: Pharmacist, community since 2007, ([employer]   Likes to play [employer] grad    Current Outpatient Medications on File Prior to Visit  Medication Sig Dispense Refill  . celecoxib (CELEBREX) 200 MG capsule TAKE 1 CAPSULE ONCE DAILY  WITH FOOD 90 capsule 3  . cetirizine (ZYRTEC) 10 MG tablet Take 1 tablet (10 mg total) by mouth daily. 90 tablet 3  . COUMADIN 3 MG tablet Take 1 tablet daily or TAKE AS DIRECTED BY ANTI-COAGULATION CLINIC 105 tablet 0  . magnesium oxide (MAG-OX) 400 MG tablet Take 600 mg by mouth daily.     . Pyridoxine HCl (VITAMIN B-6 PO) Take by mouth.    . tadalafil (CIALIS) 20 MG tablet Take 1 tablet (20 mg  total) by mouth daily as needed for erectile dysfunction. 10 tablet 6  . tadalafil (CIALIS) 5 MG tablet Take 1 tablet (5 mg total) by mouth daily. 90 tablet 3  . valACYclovir (VALTREX) 1000 MG tablet Take 2 tablets (2,000 mg total) by mouth 2 (two) times daily as needed (for one day). 20 tablet 12   No current facility-administered medications on file prior to visit.     No Known Allergies  Family History  Problem Relation Age of Onset  . Alcohol abuse Father   . Deep vein thrombosis Father        h/o phlebitis  . Deep vein thrombosis Mother   . Skin cancer Mother   . Lung cancer Mother   . Deep vein thrombosis Sister   . Alzheimer's disease Paternal Grandmother   . Alcohol abuse Maternal Grandfather   . Diabetes Maternal Grandmother   . Coronary artery disease Maternal Grandmother   . Stroke Maternal Grandmother   . Prostate cancer Neg Hx   . Colon cancer Neg Hx   . Other Neg Hx        pituitary dz    BP (!) 146/82   Pulse 74   Wt 233 lb 6.4 oz (105.9 kg)   SpO2 97%   BMI 32.55 kg/m    Review of Systems denies depression, numbness, weight change, decreased urinary stream, gynecomastia, muscle weakness, fever, headache, sob, rash, blurry vision, rhinorrhea, and chest pain.  He has chronic ED sxs and easy bruising.       Objective:   Physical Exam VS: see vs page GEN: no distress HEAD: head: no deformity eyes: no periorbital swelling, no proptosis external nose and ears are normal mouth: no lesion seen NECK: supple, thyroid is not enlarged CHEST WALL: no deformity LUNGS: clear to auscultation BREASTS:  No gynecomastia.  CV: reg rate and rhythm, no murmur ABD: abdomen is soft, nontender.  no hepatosplenomegaly.  not distended.  no hernia GENITALIA:  Normal male.   RECTAL: normal external and internal exam.  heme neg. PROSTATE:  Normal size.  No nodule MUSCULOSKELETAL: muscle bulk and strength are grossly normal.  no obvious joint swelling.  gait is normal and  steady EXTEMITIES: no deformity.  no ulcer on the feet.  feet are of normal color and temp.  no edema PULSES: dorsalis pedis intact bilat.  no carotid bruit NEURO:  cn 2-12 grossly intact.   readily moves all 4's.  sensation is intact to touch on the  feet.   SKIN:  Normal texture and temperature.  No rash or suspicious lesion is visible.   NODES:  None palpable at the neck.  PSYCH: alert, well-oriented.  Does not appear anxious nor depressed.    Lab Results  Component Value Date   TESTOSTERONE 274 09/24/2017    Lab Results  Component Value Date   WBC 5.4 06/05/2017   HGB 16.1 09/06/2017   HCT 45.2 09/06/2017   MCV 89.7 06/05/2017   PLT 262.0 06/05/2017   I have reviewed outside records, and summarized:  Pt was noted to have elevated prolactin, and referred here.  He was seen in 2013 when he had polycythemia, but he was feeling well, and probs were stable.       Assessment & Plan:  Hyperprolactinemia: new, uncertain etiology.  Polycythemia and PE's: he is a poor candidate for rx to increase testosterone.   HTN: is noted today.   Patient Instructions  Your blood pressure is high today.  Please see your primary care provider soon, to have it rechecked.   blood tests are requested for you today.  We'll let you know about the results.

## 2017-11-06 LAB — PTH, INTACT AND CALCIUM
Calcium: 8.8 mg/dL (ref 8.6–10.3)
PTH: 68 pg/mL — AB (ref 14–64)

## 2017-11-06 LAB — PROLACTIN: Prolactin: 10.7 ng/mL (ref 2.0–18.0)

## 2017-11-07 ENCOUNTER — Encounter: Payer: Self-pay | Admitting: Urology

## 2017-11-07 LAB — TESTOSTERONE,FREE AND TOTAL
TESTOSTERONE FREE: 10 pg/mL (ref 7.2–24.0)
TESTOSTERONE: 228 ng/dL — AB (ref 264–916)

## 2017-11-10 ENCOUNTER — Other Ambulatory Visit: Payer: Self-pay | Admitting: Family Medicine

## 2017-11-10 DIAGNOSIS — N400 Enlarged prostate without lower urinary tract symptoms: Secondary | ICD-10-CM

## 2017-11-12 NOTE — Telephone Encounter (Signed)
Electronic refill request Last refill 09/06/17 #90 Last office visit 06/19/17

## 2017-11-13 NOTE — Telephone Encounter (Signed)
Routed over to urology.  Had been rx'd through them.

## 2017-11-15 ENCOUNTER — Encounter (INDEPENDENT_AMBULATORY_CARE_PROVIDER_SITE_OTHER): Payer: Self-pay

## 2017-11-21 ENCOUNTER — Other Ambulatory Visit: Payer: Self-pay | Admitting: Family Medicine

## 2017-11-21 NOTE — Telephone Encounter (Signed)
Patient is compliant with coumadin regimen, will refill X 6 months.

## 2017-11-29 ENCOUNTER — Other Ambulatory Visit: Payer: Self-pay | Admitting: General Practice

## 2017-11-29 MED ORDER — COUMADIN 3 MG PO TABS: ORAL_TABLET | ORAL | 1 refills | Status: DC

## 2017-11-30 ENCOUNTER — Ambulatory Visit: Payer: 59 | Admitting: Physician Assistant

## 2017-11-30 ENCOUNTER — Encounter: Payer: Self-pay | Admitting: Physician Assistant

## 2017-11-30 VITALS — BP 120/80 | HR 74 | Ht 70.0 in | Wt 243.4 lb

## 2017-11-30 DIAGNOSIS — Z86010 Personal history of colonic polyps: Secondary | ICD-10-CM | POA: Diagnosis not present

## 2017-11-30 DIAGNOSIS — Z7901 Long term (current) use of anticoagulants: Secondary | ICD-10-CM | POA: Diagnosis not present

## 2017-11-30 DIAGNOSIS — Z01818 Encounter for other preprocedural examination: Secondary | ICD-10-CM | POA: Diagnosis not present

## 2017-11-30 MED ORDER — NA SULFATE-K SULFATE-MG SULF 17.5-3.13-1.6 GM/177ML PO SOLN: ORAL | 0 refills | Status: DC

## 2017-11-30 NOTE — Progress Notes (Signed)
Chief Complaint: Procedural visit for screening colonoscopy in a patient on chronic anticoagulation  HPI:    Ronald Hatfield is a 52 year old male with a past medical history as listed below including DVT on Coumadin, who was referred to me by Tonia Ghent, MD for preprocedural visit for a colonoscopy in a patient on chronic anticoagulation with Coumadin.    09/10/2014 colonoscopy with a sessile polyp in the descending colon, small pedunculated polyp in the proximal sigmoid colon otherwise normal.  Pathology showed tubular adenomas.  Repeat recommended in 3 years.    Today, explains that the last time he had a colonoscopy he had to have to because his physician did not want to stop his Coumadin at first.  Patient tells me he does not want to have to do that again.  He has had no trouble over the past 3 years since his last colonoscopy.  Denies any GI complaints today.    Denies fever, chills, change in bowel habits, weight loss, heartburn, reflux or abdominal pain.  Past Medical History:  Diagnosis Date  . Attention deficit disorder without mention of hyperactivity   . Depressive disorder, not elsewhere classified   . Dermatophytosis of the body   . Encounter for therapeutic drug monitoring   . History of DVT (deep vein thrombosis)   . Impotence of organic origin   . 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 Long term (current) use of anticoagulants   . Other abnormal glucose   . Other pulmonary embolism and infarction    Hospitalization 09/12/08-09/16/08  . Overweight(278.02)   . Pure hypercholesterolemia   . RLS (restless legs syndrome)   . Routine general medical examination at a health care facility   . Unspecified viral infection, in conditions classified elsewhere and of unspecified site     Past Surgical History:  Procedure Laterality Date  . LIPOMA EXCISION  1994   Right clavicular head  . VASECTOMY  02/07/02   Dr. Council Mechanic    Current  Outpatient Medications  Medication Sig Dispense Refill  . celecoxib (CELEBREX) 200 MG capsule TAKE 1 CAPSULE ONCE DAILY  WITH FOOD 90 capsule 3  . COUMADIN 3 MG tablet Take as directed by anticoagulation clinic. 105 tablet 1  . tadalafil (CIALIS) 20 MG tablet Take 1 tablet (20 mg total) by mouth daily as needed for erectile dysfunction. 10 tablet 6  . tadalafil (CIALIS) 5 MG tablet Take 1 tablet (5 mg total) by mouth daily. 90 tablet 3  . tadalafil (CIALIS) 5 MG tablet TAKE 1 TABLET DAILY 90 tablet 3  . valACYclovir (VALTREX) 1000 MG tablet Take 2 tablets (2,000 mg total) by mouth 2 (two) times daily as needed (for one day). 20 tablet 12   No current facility-administered medications for this visit.     Allergies as of 11/30/2017  . (No Known Allergies)    Family History  Problem Relation Age of Onset  . Alcohol abuse Father   . Deep vein thrombosis Father        h/o phlebitis  . Deep vein thrombosis Mother   . Skin cancer Mother   . Lung cancer Mother   . Deep vein thrombosis Sister   . Alzheimer's disease Paternal Grandmother   . Alcohol abuse Maternal Grandfather   . Diabetes Maternal Grandmother   . Coronary artery disease Maternal Grandmother   . Stroke Maternal Grandmother   . Prostate cancer Neg Hx   . Colon cancer Neg Hx   .  Other Neg Hx        pituitary dz    Social History   Socioeconomic History  . Marital status: Married    Spouse name: Not on file  . Number of children: 2  . Years of education: Not on file  . Highest education level: Not on file  Occupational History  . Occupation: Mining engineer since 2007  Social Needs  . Financial resource strain: Not on file  . Food insecurity:    Worry: Not on file    Inability: Not on file  . Transportation needs:    Medical: Not on file    Non-medical: Not on file  Tobacco Use  . Smoking status: Never Smoker  . Smokeless tobacco: Former Systems developer    Types: Chew  Substance and Sexual Activity  . Alcohol  use: Yes    Comment: occasional  . Drug use: No  . Sexual activity: Yes  Lifestyle  . Physical activity:    Days per week: Not on file    Minutes per session: Not on file  . Stress: Not on file  Relationships  . Social connections:    Talks on phone: Not on file    Gets together: Not on file    Attends religious service: Not on file    Active member of club or organization: Not on file    Attends meetings of clubs or organizations: Not on file    Relationship status: Not on file  . Intimate partner violence:    Fear of current or ex partner: Not on file    Emotionally abused: Not on file    Physically abused: Not on file    Forced sexual activity: Not on file  Other Topics Concern  . Not on file  Social History Narrative   Married 1993   Children: 2 CHILDREN    Occupation: Pharmacist, community since 2007, ([employer]   Likes to play [employer] grad    Review of Systems:    Constitutional: No weight loss, fever or chills Skin: No rash  Cardiovascular: No chest pain Respiratory: No SOB Gastrointestinal: See HPI and otherwise negative Genitourinary: No dysuria  Neurological: No headache Musculoskeletal: No new muscle or joint pain Hematologic: No bleeding  Psychiatric: No history of depression or anxiety   Physical Exam:  Vital signs: BP 120/80   Pulse 74   Ht 5\' 10"  (1.778 m)   Wt 243 lb 6 oz (110.4 kg)   BMI 34.92 kg/m   Constitutional:   Pleasant Caucasian male appears to be in NAD, Well developed, Well nourished, alert and cooperative  Respiratory: Respirations even and unlabored. Lungs clear to auscultation bilaterally.   No wheezes, crackles, or rhonchi.  Cardiovascular: Normal S1, S2. No MRG. Regular rate and rhythm. No peripheral edema, cyanosis or pallor.  Gastrointestinal:  Soft, nondistended, nontender. No rebound or guarding. Normal bowel sounds. No appreciable masses or hepatomegaly. Rectal:  Not performed.  Psychiatric:  Demonstrates good judgement and reason without abnormal affect or behaviors.  MOST RECENT LABS AND IMAGING: CBC    Component Value Date/Time   WBC 5.4 06/05/2017 1001   RBC 5.43 06/05/2017 1001   HGB 16.1 09/06/2017 1214   HCT 45.2 09/06/2017 1214   PLT 262.0 06/05/2017 1001   MCV 89.7 06/05/2017 1001   MCHC 33.5 06/05/2017 1001   RDW 13.1 06/05/2017 1001   LYMPHSABS 1.3 06/05/2017 1001   MONOABS 0.5 06/05/2017 1001   EOSABS 0.4 06/05/2017 1001  BASOSABS 0.1 06/05/2017 1001    CMP     Component Value Date/Time   NA 140 06/05/2017 1001   K 4.2 06/05/2017 1001   CL 105 06/05/2017 1001   CO2 30 06/05/2017 1001   GLUCOSE 104 (H) 06/05/2017 1001   BUN 16 06/05/2017 1001   CREATININE 1.06 06/05/2017 1001   CALCIUM 8.8 11/05/2017 1413   PROT 7.0 09/24/2017 0919   ALBUMIN 4.4 09/24/2017 0919   AST 30 09/24/2017 0919   ALT 36 09/24/2017 0919   ALKPHOS 49 09/24/2017 0919   BILITOT 1.0 09/24/2017 0919   GFRNONAA 70 06/10/2008 1100   GFRAA 85 06/10/2008 1100    Assessment: 1.  History of adenomatous polyps: Last colonoscopy 09/10/2014 repeat recommended in 3 years 2.  Chronic anticoagulation: With Coumadin 3.  History of PE: On Coumadin  Plan: 1.  Scheduled patient for surveillance colonoscopy due to his history of adenomatous polyps in the [place] with Dr. Fuller Plan.  Did discuss risk, benefits, limitations and alternatives and the patient agrees to proceed. 2.  Patient was advised to hold his Coumadin for 5 days prior to time of his procedure.  We will communicate with his physician to ensure that holding his Coumadin is acceptable for him.  Likely he will need Lovenox to bridge. 3.  Patient to follow in clinic per recommendations from Dr. Fuller Plan after time of procedure. 4.  Did spend time educating the patient today on the types of colon polyps that were found during his last colonoscopy.  These were precancerous and this is why he is on shortened schedule.  Ellouise Newer,  PA-C [facility] 11/30/2017, 10:09 AM  Cc: Tonia Ghent, MD

## 2017-11-30 NOTE — Progress Notes (Signed)
Reviewed and agree with management plan.  Gray Doering T. Tymeka Privette, MD FACG 

## 2017-11-30 NOTE — Patient Instructions (Addendum)
If you are age 54 or younger, your body mass index should be between 19-25. Your Body mass index is 34.92 kg/m. If this is out of the aformentioned range listed, please consider follow up with your Primary Care Provider.   We have sent the prescription for the colonoscopy prep  to your pharmacy for you to pick up at your convenience: [address], [place], [place].  We will call you with the Coumadin clearance instructions from Dr. Jimmy Footman.   You have been scheduled for a colonoscopy. Please follow written instructions given to you at your visit today.  Please pick up your prep supplies at the pharmacy within the next 1-3 days. If you use inhalers (even only as needed), please bring them with you on the day of your procedure. Your physician has requested that you go to www.startemmi.com and enter the access code given to you at your visit today. This web site gives a general overview about your procedure. However, you should still follow specific instructions given to you by our office regarding your preparation for the procedure.

## 2017-12-04 ENCOUNTER — Telehealth: Payer: Self-pay | Admitting: *Deleted

## 2017-12-04 NOTE — Telephone Encounter (Signed)
  12/04/2017   RE: RENLY ROOTS DOB: [birth year] MRN: [id number]   Dear Dr. Elsie Stain,    We have scheduled the above patient for an endoscopic procedure. Our records show that he is on anticoagulation therapy.   Please advise as to how long the patient may come off his therapy of Coumadin prior to the procedure, which is scheduled for 01-29-2018.  Please  route the   Sincerely,    Ellouise Newer PA-C

## 2017-12-06 ENCOUNTER — Ambulatory Visit: Payer: 59 | Admitting: General Practice

## 2017-12-06 ENCOUNTER — Telehealth: Payer: Self-pay | Admitting: Oncology

## 2017-12-06 ENCOUNTER — Ambulatory Visit: Payer: 59

## 2017-12-06 DIAGNOSIS — Z7901 Long term (current) use of anticoagulants: Secondary | ICD-10-CM | POA: Diagnosis not present

## 2017-12-06 DIAGNOSIS — Z86711 Personal history of pulmonary embolism: Secondary | ICD-10-CM

## 2017-12-06 LAB — POCT INR: INR: 2.8 (ref 2.0–3.0)

## 2017-12-06 NOTE — Patient Instructions (Addendum)
Pre visit review using our clinic review tool, if applicable. No additional management support is needed unless otherwise documented below in the visit note.  Continue to take 1 tablet daily.  Re-check in 6 weeks.

## 2017-12-06 NOTE — Telephone Encounter (Signed)
Will also include Villa Herb, RN on this conversation as she will be seeing patient this afternoon in clinic for his INR check.

## 2017-12-06 NOTE — Telephone Encounter (Signed)
Returned patient call per Diana/ schd msg. Per patient request, cancel all future appts.  Per patient, please refer to MyChart notes from Dr. Zara Council.  Msg sent to Dr Tasia Catchings

## 2017-12-06 NOTE — Telephone Encounter (Signed)
Ronald Hatfield- please talk to me.  Thanks.  On coumadin, h/o PE.

## 2017-12-08 NOTE — Progress Notes (Signed)
Agree. Thanks

## 2017-12-12 NOTE — Telephone Encounter (Signed)
LM on machine for patient to call and discuss his PE hx.  Appears patient suffered a DVT/PE in 2010 with life long recommended coumadin therapy.  I do not see any repeat occurrences and appears last treated with Lovenox 100mg  bid in February of 2016.    Dr. Damita Dunnings please advise if you would recommend the usual 5 day hold with Lovenox bridge?  According to my dosing indications, and patients current [facility], he would still be appropriate for 100mg  bid dosing.    Procedure is not until 01/29/2018.    Thanks.

## 2017-12-13 ENCOUNTER — Other Ambulatory Visit: Payer: 59

## 2017-12-13 ENCOUNTER — Ambulatory Visit: Payer: 59 | Admitting: Oncology

## 2017-12-16 NOTE — Telephone Encounter (Signed)
Would use lovenox 100mg  BID for 5 days prior to the procedure.  Off coumadin on those days.   Stop hold both lovenox and coumadin the morning of the procedure.  Restart both lovenox and coumadin after the procedure assuming he didn't have any bleeding.   Stop lovenox when INR therapeutic thereafter.   Mandy- I have the rx ready to send but I please make sure you have talked with him prior to me sending the rx.  I routed this back to Ellouise Newer in the meantime.   Thanks.

## 2017-12-17 NOTE — Telephone Encounter (Signed)
Please ensure patient is aware of all recommendations. Thanks-JLL

## 2017-12-17 NOTE — Telephone Encounter (Signed)
Called patient to advise per Dr. Damita Dunnings, he is to use lovenox 100mg  BID for 5 days prior to the procedure.  Off coumadin on those days.  ( 7-25 through 7-30 ) Stop hold both lovenox and coumadin the morning of the procedure.  Restart both lovenox and coumadin after the procedure assuming he didn't have any bleeding.   Stop lovenox when INR therapeutic thereafter.  I advised the patient to call the coumadin clinic, Villa Herb RN regarding the Lovenox injections. The patient verbalized these instructions and understood them.  I advised Dr. Fuller Plan will talk to him right after the procedure and let him know if he had any bleeding during the colonoscopy.

## 2017-12-17 NOTE — Telephone Encounter (Signed)
Sent Meriam Sprague RN a message advising I called the patient with Dr. Phillip Heal Duncan's coumadin and Lovenox recommendations.  In the message I asked Meriam Sprague RN to have someone from her office to call the patient also to verify these directions since they are from the coumadin clinic.

## 2017-12-18 NOTE — Telephone Encounter (Signed)
I sent a message to Meriam Sprague RN, asking her to contact the patient with Dr. Tonia Ghent recommendations on the Coumadin.  Caren Griffins responded to me advising she will be contacting the patient and will take care of him regarding his anticoagulation needs.

## 2018-01-15 ENCOUNTER — Telehealth: Payer: Self-pay | Admitting: General Practice

## 2018-01-15 ENCOUNTER — Encounter: Payer: Self-pay | Admitting: Gastroenterology

## 2018-01-15 NOTE — Telephone Encounter (Signed)
Instructions for coumadin and Lovenox pre and post procedure on 7/30.  7/25 - Last dose of coumadin until after procedure 7/26 - Nothing (No coumadin and Lovenox) 7/27 - Lovenox in AM and PM (12 hours apart) 7/28 - Lovenox in AM and PM 7/29 - Lovenox in AM only. 7/30 - Procedure (No Lovenox today) 7/31 - Lovenox in AM and PM and 1 1/2 tablets of coumadin 8/1   - Lovenox in AM and PM and 1 1/2 tablets of coumadin 8/2   - Lovenox in AM and PM and 1 1/2 tablets of coumadin 8/3   - Lovenox in AM and PM and 1 tablet of coumadin 8/4   - Stop Lovenox and Continue to take 1 tablet of coumadin daily.  8/6 - Check INR in clinic.

## 2018-01-15 NOTE — Telephone Encounter (Signed)
Will forward this to Dr. Damita Dunnings for approval.  Patient to be bridged with Lovenox 100mg  bid during a 5 day hold for procedure on 01/29/18.

## 2018-01-16 ENCOUNTER — Other Ambulatory Visit: Payer: Self-pay | Admitting: General Practice

## 2018-01-16 MED ORDER — ENOXAPARIN SODIUM 100 MG/ML ~~LOC~~ SOLN: 100.0000 mg | Freq: Two times a day (BID) | SUBCUTANEOUS | 0 refills | Status: DC

## 2018-01-16 NOTE — Telephone Encounter (Signed)
Opened in error

## 2018-01-16 NOTE — Telephone Encounter (Signed)
I agree.  Many thanks.  Let me know if you want me to send in the rx.   Ronald Hatfield

## 2018-01-17 ENCOUNTER — Ambulatory Visit: Payer: 59 | Admitting: General Practice

## 2018-01-17 DIAGNOSIS — Z86711 Personal history of pulmonary embolism: Secondary | ICD-10-CM

## 2018-01-17 DIAGNOSIS — Z7901 Long term (current) use of anticoagulants: Secondary | ICD-10-CM

## 2018-01-17 LAB — POCT INR: INR: 2.7 (ref 2.0–3.0)

## 2018-01-17 NOTE — Patient Instructions (Addendum)
Pre visit review using our clinic review tool, if applicable. No additional management support is needed unless otherwise documented below in the visit note.  Continue to take 1 tablet daily.  Re-check on 8/6.  Please follow patient instructions.  Note    Instructions for coumadin and Lovenox pre and post procedure on 7/30.  7/25 - Last dose of coumadin until after procedure 7/26 - Nothing (No coumadin and Lovenox) 7/27 - Lovenox in AM and PM (12 hours apart) 7/28 - Lovenox in AM and PM 7/29 - Lovenox in AM only. 7/30 - Procedure (No Lovenox today) 7/31 - Lovenox in AM and PM and 1 1/2 tablets of coumadin 8/1   - Lovenox in AM and PM and 1 1/2 tablets of coumadin 8/2   - Lovenox in AM and PM and 1 1/2 tablets of coumadin 8/3   - Lovenox in AM and PM and 1 tablet of coumadin 8/4   - Stop Lovenox and Continue to take 1 tablet of coumadin daily.  8/6 - Check   Patient verbalizes understanding.

## 2018-01-29 ENCOUNTER — Ambulatory Visit (AMBULATORY_SURGERY_CENTER): Payer: 59 | Admitting: Gastroenterology

## 2018-01-29 ENCOUNTER — Encounter: Payer: Self-pay | Admitting: Gastroenterology

## 2018-01-29 VITALS — BP 136/85 | HR 76 | Temp 98.4°F | Resp 11 | Ht 70.0 in | Wt 243.0 lb

## 2018-01-29 DIAGNOSIS — D120 Benign neoplasm of cecum: Secondary | ICD-10-CM

## 2018-01-29 DIAGNOSIS — D125 Benign neoplasm of sigmoid colon: Secondary | ICD-10-CM

## 2018-01-29 DIAGNOSIS — Z86010 Personal history of colonic polyps: Secondary | ICD-10-CM | POA: Diagnosis not present

## 2018-01-29 DIAGNOSIS — K635 Polyp of colon: Secondary | ICD-10-CM | POA: Diagnosis not present

## 2018-01-29 DIAGNOSIS — D123 Benign neoplasm of transverse colon: Secondary | ICD-10-CM

## 2018-01-29 MED ORDER — SODIUM CHLORIDE 0.9 % IV SOLN: 500.0000 mL | Freq: Once | INTRAVENOUS | Status: DC

## 2018-01-29 NOTE — Progress Notes (Signed)
Called to room to assist during endoscopic procedure.  Patient ID and intended procedure confirmed with present staff. Received instructions for my participation in the procedure from the performing physician.  

## 2018-01-29 NOTE — Patient Instructions (Signed)
Handouts given:  Hemorrhoids and Polyps  YOU HAD AN ENDOSCOPIC PROCEDURE TODAY AT [facility]:   Refer to the procedure report that was given to you for any specific questions about what was found during the examination.  If the procedure report does not answer your questions, please call your gastroenterologist to clarify.  If you requested that your care partner not be given the details of your procedure findings, then the procedure report has been included in a sealed envelope for you to review at your convenience later.  YOU SHOULD EXPECT: Some feelings of bloating in the abdomen. Passage of more gas than usual.  Walking can help get rid of the air that was put into your GI tract during the procedure and reduce the bloating. If you had a lower endoscopy (such as a colonoscopy or flexible sigmoidoscopy) you may notice spotting of blood in your stool or on the toilet paper. If you underwent a bowel prep for your procedure, you may not have a normal bowel movement for a few days.  Please Note:  You might notice some irritation and congestion in your nose or some drainage.  This is from the oxygen used during your procedure.  There is no need for concern and it should clear up in a day or so.  SYMPTOMS TO REPORT IMMEDIATELY:   Following lower endoscopy (colonoscopy or flexible sigmoidoscopy):  Excessive amounts of blood in the stool  Significant tenderness or worsening of abdominal pains  Swelling of the abdomen that is new, acute  Fever of 100F or higher  For urgent or emergent issues, a gastroenterologist can be reached at any hour by calling [phone].   DIET:  We do recommend a small meal at first, but then you may proceed to your regular diet.  Drink plenty of fluids but you should avoid alcoholic beverages for 24 hours.  ACTIVITY:  You should plan to take it easy for the rest of today and you should NOT DRIVE or use heavy machinery until tomorrow (because of the  sedation medicines used during the test).    FOLLOW UP: Our staff will call the number listed on your records the next business day following your procedure to check on you and address any questions or concerns that you may have regarding the information given to you following your procedure. If we do not reach you, we will leave a message.  However, if you are feeling well and you are not experiencing any problems, there is no need to return our call.  We will assume that you have returned to your regular daily activities without incident.  If any biopsies were taken you will be contacted by phone or by letter within the next 1-3 weeks.  Please call us at [phone] if you have not heard about the biopsies in 3 weeks.    SIGNATURES/CONFIDENTIALITY: You and/or your care partner have signed paperwork which will be entered into your electronic medical record.  These signatures attest to the fact that that the information above on your After Visit Summary has been reviewed and is understood.  Full responsibility of the confidentiality of this discharge information lies with you and/or your care-partner.

## 2018-01-29 NOTE — Progress Notes (Signed)
Spontaneous respirations throughout. VSS. Resting comfortably. To PACU on room air. Report to  RN. 

## 2018-01-29 NOTE — Op Note (Signed)
[facility] Patient Name: Ronald Hatfield Procedure Date: 01/29/2018 3:13 PM MRN: [id number] Endoscopist: Ladene Artist , MD Age: 52 Referring MD:  Date of Birth: 1965/07/29 Gender: Male Account #: [id number] Procedure:                Colonoscopy Indications:              Surveillance: Personal history of adenomatous                            polyps on last colonoscopy 3 years ago Medicines:                Monitored Anesthesia Care Procedure:                Pre-Anesthesia Assessment:                           - Prior to the procedure, a History and Physical                            was performed, and patient medications and                            allergies were reviewed. The patient's tolerance of                            previous anesthesia was also reviewed. The risks                            and benefits of the procedure and the sedation                            options and risks were discussed with the patient.                            All questions were answered, and informed consent                            was obtained. Prior Anticoagulants: The patient has                            taken Coumadin (warfarin), last dose was 5 days                            prior to procedure and Lovenox, last dose                            yesterday. ASA Grade Assessment: III - A patient                            with severe systemic disease. After reviewing the                            risks and benefits, the patient was deemed in  satisfactory condition to undergo the procedure.                           After obtaining informed consent, the colonoscope                            was passed under direct vision. Throughout the                            procedure, the patient's blood pressure, pulse, and                            oxygen saturations were monitored continuously. The                            Colonoscope was introduced  through the anus and                            advanced to the the cecum, identified by                            appendiceal orifice and ileocecal valve. The                            ileocecal valve, appendiceal orifice, and rectum                            were photographed. The quality of the bowel                            preparation was good. The colonoscopy was performed                            without difficulty. The patient tolerated the                            procedure well. Scope In: 3:25:07 PM Scope Out: 3:40:27 PM Scope Withdrawal Time: 0 hours 13 minutes 53 seconds  Total Procedure Duration: 0 hours 15 minutes 20 seconds  Findings:                 The perianal and digital rectal examinations were                            normal.                           Four sessile polyps were found in the sigmoid                            colon, (1) transverse colon (2) and cecum (1). The                            polyps were 5 to 7 mm in size. These polyps were  removed with a cold snare. Resection and retrieval                            were complete.                           A diffuse area of mild melanosis was found in the                            entire colon.                           Internal hemorrhoids were found during                            retroflexion. The hemorrhoids were small and Grade                            I (internal hemorrhoids that do not prolapse).                           The exam was otherwise without abnormality on                            direct and retroflexion views. Complications:            No immediate complications. Estimated blood loss:                            None. Estimated Blood Loss:     Estimated blood loss: none. Impression:               - Four 5 to 7 mm polyps in the sigmoid colon, in                            the transverse colon and in the cecum, removed with                             a cold snare. Resected and retrieved.                           - Melanosis in the colon.                           - Internal hemorrhoids.                           - The examination was otherwise normal on direct                            and retroflexion views. Recommendation:           - Repeat colonoscopy in 3 - 5 years for                            surveillance pending pathology review.                           -  Resume Coumadin (warfarin) tomorrow as directed.                            Resume Lovenox as directed. Refer to managing                            physician for further adjustment of therapy.                           - Patient has a contact number available for                            emergencies. The signs and symptoms of potential                            delayed complications were discussed with the                            patient. Return to normal activities tomorrow.                            Written discharge instructions were provided to the                            patient.                           - Resume previous diet.                           - Continue present medications.                           - Await pathology results.                           - No aspirin, ibuprofen, naproxen, or other                            non-steroidal anti-inflammatory drugs for 2 weeks                            after polyp removal. Ladene Artist, MD 01/29/2018 3:46:12 PM This report has been signed electronically.

## 2018-01-30 ENCOUNTER — Telehealth: Payer: Self-pay | Admitting: *Deleted

## 2018-01-30 NOTE — Telephone Encounter (Signed)
  Follow up Call-  Call back number 01/29/2018  Post procedure Call Back phone  # [phone]  Permission to leave phone message Yes  Some recent data might be hidden     Patient questions:  Do you have a fever, pain , or abdominal swelling? No. Pain Score  0 *  Have you tolerated food without any problems? Yes.    Have you been able to return to your normal activities? Yes.    Do you have any questions about your discharge instructions: Diet   No. Medications  No. Follow up visit  No.  Do you have questions or concerns about your Care? No.  Actions: * If pain score is 4 or above: No action needed, pain <4.

## 2018-02-05 ENCOUNTER — Ambulatory Visit: Payer: 59

## 2018-02-05 DIAGNOSIS — Z7901 Long term (current) use of anticoagulants: Secondary | ICD-10-CM | POA: Diagnosis not present

## 2018-02-05 DIAGNOSIS — Z86711 Personal history of pulmonary embolism: Secondary | ICD-10-CM

## 2018-02-05 LAB — POCT INR: INR: 1.6 — AB (ref 2.0–3.0)

## 2018-02-05 NOTE — Patient Instructions (Signed)
INR today 1.6  Take 1.5 pills (4.5mg ) today and tomorrow (8/6, 8/7) and then resume 3mg  daily.  Patient will be out of town and unable to recheck until 03/05/18.  Patient aware to call if any unusual bruising or bleeding or go to ER if any concerns develop.

## 2018-02-12 ENCOUNTER — Encounter: Payer: Self-pay | Admitting: Gastroenterology

## 2018-03-05 ENCOUNTER — Ambulatory Visit: Payer: 59

## 2018-03-05 DIAGNOSIS — Z7901 Long term (current) use of anticoagulants: Secondary | ICD-10-CM

## 2018-03-05 DIAGNOSIS — Z86711 Personal history of pulmonary embolism: Secondary | ICD-10-CM

## 2018-03-05 LAB — POCT INR: INR: 1.7 — AB (ref 2.0–3.0)

## 2018-03-05 NOTE — Patient Instructions (Addendum)
INR today 1.7  *Note patient missed his dose yesterday 03/04/18.   Take 1.5 pills (4.5mg ) Friday for a boost instead of today as he has a procedure in 2 days  (Thursday) and was instructed not to hold but needed to be (low) or under 3.0 2 days before and then resume 3mg  daily.  We will recheck again in 3-4 weeks.  Patient requests 04/02/18 for recheck.

## 2018-03-06 NOTE — Progress Notes (Signed)
Agree. Thanks

## 2018-03-07 DIAGNOSIS — K048 Radicular cyst: Secondary | ICD-10-CM | POA: Diagnosis not present

## 2018-03-14 DIAGNOSIS — K048 Radicular cyst: Secondary | ICD-10-CM | POA: Diagnosis not present

## 2018-04-02 ENCOUNTER — Ambulatory Visit: Payer: 59

## 2018-04-02 DIAGNOSIS — Z7901 Long term (current) use of anticoagulants: Secondary | ICD-10-CM

## 2018-04-02 DIAGNOSIS — Z23 Encounter for immunization: Secondary | ICD-10-CM | POA: Diagnosis not present

## 2018-04-02 DIAGNOSIS — Z86711 Personal history of pulmonary embolism: Secondary | ICD-10-CM

## 2018-04-02 LAB — POCT INR: INR: 4.4 — AB (ref 2.0–3.0)

## 2018-04-02 NOTE — Patient Instructions (Addendum)
INR 4.4 *Note Patient took an extra 1.5 doses in past week without realizing until after the fact likely contributing to elevation.   Hold coumadin tomorrow and Thursday (10/3) and then resume prior dosing, recheck in 4 weeks.   Patient is doing well without any changes to diet, health or medications.   Patient denies any abnormal bruising or bleeding and understands the risks associated with the supratherapeutic level.  He will go to the ER if any concerns develop.    Also, while in for INR check today, patient received his flu shot to left deltoid and tolerated well.

## 2018-04-03 NOTE — Progress Notes (Signed)
Agree. Thanks

## 2018-04-10 ENCOUNTER — Other Ambulatory Visit: Payer: Self-pay | Admitting: Family Medicine

## 2018-04-10 NOTE — Telephone Encounter (Signed)
Pt last seen by Dr Damita Dunnings for annual on 06/19/17. Pt last refilled Cialis 5 mg # 90 x 3 on 09/06/17 and again # 90 x 3 on 11/13/17 and  Cialis 20 mg # 10 x 6 on 09/06/17 by Dr Ernestine Conrad. Pt requested Dr Damita Dunnings to fill. Also valtrex 1000 mg # 20 x 12 on 09/14/16. Celebrex 200 mg filled # 90 x 3 on 08/23/17 to mail order pharmacy.Please advise. Pt has CPX scheduled 06/24/18.

## 2018-04-10 NOTE — Telephone Encounter (Signed)
Pt needing to have new RX for celecoxib, tadalafil 20mg , tadalafil 5mg  and valacyclovir sent to local pharmacy. He does not use mail order. Pharmacy advised him to call MD office.  I did notify pt that tadalafil was last filled by urology and he requested to ask Dr. Damita Dunnings.  [id number] [address], [place] [phone] (Phone) [phone] (Fax)

## 2018-04-10 NOTE — Telephone Encounter (Signed)
He needs to have urology address cialis.   I can send Valtrex.   He should still have refills on celebrex via mail order.    Does he want the valtrex and celebrex send locally?  Let me know.  Thanks.

## 2018-04-11 NOTE — Telephone Encounter (Signed)
Patient says he has the medication right now and will make a decision as to where he would like his prescriptions to be sent and let us know.

## 2018-04-30 ENCOUNTER — Ambulatory Visit: Payer: 59

## 2018-04-30 DIAGNOSIS — Z86711 Personal history of pulmonary embolism: Secondary | ICD-10-CM

## 2018-04-30 DIAGNOSIS — Z7901 Long term (current) use of anticoagulants: Secondary | ICD-10-CM | POA: Diagnosis not present

## 2018-04-30 LAB — POCT INR: INR: 1.2 — AB (ref 2.0–3.0)

## 2018-04-30 NOTE — Patient Instructions (Signed)
INR 1.2   *Note Patient missed his Saturday and Sunday dose  Take 6mg  today and 4.5mg  and then resume prior dosing, recheck in 2 weeks.   Patient is doing well without any changes to diet, health or medications.    He is aware of risks associated with a subtherapeutic level and will go to the ER if any concerns develop.

## 2018-05-01 NOTE — Progress Notes (Signed)
Agree. Thanks

## 2018-05-14 ENCOUNTER — Ambulatory Visit: Payer: 59

## 2018-05-14 DIAGNOSIS — Z7901 Long term (current) use of anticoagulants: Secondary | ICD-10-CM

## 2018-05-14 DIAGNOSIS — Z86711 Personal history of pulmonary embolism: Secondary | ICD-10-CM

## 2018-05-14 LAB — POCT INR: INR: 3.3 — AB (ref 2.0–3.0)

## 2018-05-14 NOTE — Patient Instructions (Signed)
INR 3.3  Take 1.5mg  tomorrow (1/2 dose on 11/13) and then resume prior dose of 3mg  daily  and 4.5mg  tomorrow and then resume prior dosing, recheck in 4 weeks, patient requests 12/19.   Patient is doing well without any changes to diet, health or medications.

## 2018-05-15 NOTE — Progress Notes (Signed)
Agree. Thanks

## 2018-06-03 DIAGNOSIS — D225 Melanocytic nevi of trunk: Secondary | ICD-10-CM | POA: Diagnosis not present

## 2018-06-03 DIAGNOSIS — D2261 Melanocytic nevi of right upper limb, including shoulder: Secondary | ICD-10-CM | POA: Diagnosis not present

## 2018-06-03 DIAGNOSIS — D2262 Melanocytic nevi of left upper limb, including shoulder: Secondary | ICD-10-CM | POA: Diagnosis not present

## 2018-06-14 ENCOUNTER — Other Ambulatory Visit: Payer: Self-pay | Admitting: Family Medicine

## 2018-06-14 NOTE — Telephone Encounter (Signed)
Patient is compliant with coumadin management will refill X 6 months.   

## 2018-06-17 ENCOUNTER — Other Ambulatory Visit: Payer: Self-pay | Admitting: Family Medicine

## 2018-06-17 DIAGNOSIS — E7800 Pure hypercholesterolemia, unspecified: Secondary | ICD-10-CM

## 2018-06-19 DIAGNOSIS — H524 Presbyopia: Secondary | ICD-10-CM | POA: Diagnosis not present

## 2018-06-20 ENCOUNTER — Ambulatory Visit (INDEPENDENT_AMBULATORY_CARE_PROVIDER_SITE_OTHER): Payer: 59

## 2018-06-20 ENCOUNTER — Other Ambulatory Visit (INDEPENDENT_AMBULATORY_CARE_PROVIDER_SITE_OTHER): Payer: 59

## 2018-06-20 ENCOUNTER — Ambulatory Visit: Payer: 59 | Admitting: General Practice

## 2018-06-20 DIAGNOSIS — Z23 Encounter for immunization: Secondary | ICD-10-CM

## 2018-06-20 DIAGNOSIS — E7800 Pure hypercholesterolemia, unspecified: Secondary | ICD-10-CM

## 2018-06-20 DIAGNOSIS — Z7901 Long term (current) use of anticoagulants: Secondary | ICD-10-CM

## 2018-06-20 DIAGNOSIS — Z86711 Personal history of pulmonary embolism: Secondary | ICD-10-CM

## 2018-06-20 LAB — LIPID PANEL
Cholesterol: 223 mg/dL — ABNORMAL HIGH (ref 0–200)
HDL: 51 mg/dL (ref >39.00)
LDL Cholesterol: 142 mg/dL — ABNORMAL HIGH (ref 0–99)
NonHDL: 172.16
Total CHOL/HDL Ratio: 4
Triglycerides: 150 mg/dL — ABNORMAL HIGH (ref 0.0–149.0)
VLDL: 30 mg/dL (ref 0.0–40.0)

## 2018-06-20 LAB — COMPREHENSIVE METABOLIC PANEL
ALT: 23 U/L (ref 0–53)
AST: 17 U/L (ref 0–37)
Albumin: 4.2 g/dL (ref 3.5–5.2)
Alkaline Phosphatase: 45 U/L (ref 39–117)
BUN: 17 mg/dL (ref 6–23)
CO2: 29 mEq/L (ref 19–32)
CREATININE: 1.15 mg/dL (ref 0.40–1.50)
Calcium: 8.9 mg/dL (ref 8.4–10.5)
Chloride: 104 mEq/L (ref 96–112)
GFR: 70.23 mL/min (ref >60.00)
Glucose, Bld: 95 mg/dL (ref 70–99)
Potassium: 4.4 mEq/L (ref 3.5–5.1)
SODIUM: 139 meq/L (ref 135–145)
TOTAL PROTEIN: 6.9 g/dL (ref 6.0–8.3)
Total Bilirubin: 0.8 mg/dL (ref 0.2–1.2)

## 2018-06-20 LAB — CBC WITH DIFFERENTIAL/PLATELET
BASOS ABS: 0.1 10*3/uL (ref 0.0–0.1)
Basophils Relative: 1 % (ref 0.0–3.0)
EOS ABS: 0.5 10*3/uL (ref 0.0–0.7)
Eosinophils Relative: 8.7 % — ABNORMAL HIGH (ref 0.0–5.0)
HCT: 46.7 % (ref 39.0–52.0)
Hemoglobin: 16.3 g/dL (ref 13.0–17.0)
Lymphocytes Relative: 22.9 % (ref 12.0–46.0)
Lymphs Abs: 1.4 10*3/uL (ref 0.7–4.0)
MCHC: 34.8 g/dL (ref 30.0–36.0)
MCV: 88.5 fl (ref 78.0–100.0)
Monocytes Absolute: 0.6 10*3/uL (ref 0.1–1.0)
Monocytes Relative: 9 % (ref 3.0–12.0)
Neutro Abs: 3.6 10*3/uL (ref 1.4–7.7)
Neutrophils Relative %: 58.4 % (ref 43.0–77.0)
Platelets: 231 10*3/uL (ref 150.0–400.0)
RBC: 5.28 Mil/uL (ref 4.22–5.81)
RDW: 13.5 % (ref 11.5–15.5)
WBC: 6.2 10*3/uL (ref 4.0–10.5)

## 2018-06-20 LAB — POCT INR: INR: 2 (ref 2.0–3.0)

## 2018-06-20 NOTE — Patient Instructions (Addendum)
Pre visit review using our clinic review tool, if applicable. No additional management support is needed unless otherwise documented below in the visit note.  Continue to take 3mg  daily.  Re-check in 4 weeks.   Patient is doing well without any changes to diet, health or medications.

## 2018-06-23 NOTE — Progress Notes (Signed)
Agree. Thanks

## 2018-06-24 ENCOUNTER — Encounter: Payer: Self-pay | Admitting: Family Medicine

## 2018-06-24 ENCOUNTER — Ambulatory Visit (INDEPENDENT_AMBULATORY_CARE_PROVIDER_SITE_OTHER): Payer: 59 | Admitting: Family Medicine

## 2018-06-24 VITALS — BP 154/88 | HR 80 | Resp 16 | Ht 70.0 in | Wt 267.0 lb

## 2018-06-24 DIAGNOSIS — Z0000 Encounter for general adult medical examination without abnormal findings: Secondary | ICD-10-CM

## 2018-06-24 DIAGNOSIS — Z7189 Other specified counseling: Secondary | ICD-10-CM

## 2018-06-24 DIAGNOSIS — Z86711 Personal history of pulmonary embolism: Secondary | ICD-10-CM

## 2018-06-24 DIAGNOSIS — E663 Overweight: Secondary | ICD-10-CM

## 2018-06-24 DIAGNOSIS — B001 Herpesviral vesicular dermatitis: Secondary | ICD-10-CM

## 2018-06-24 MED ORDER — VALACYCLOVIR HCL 1 G PO TABS: 2000.0000 mg | ORAL_TABLET | Freq: Two times a day (BID) | ORAL | 12 refills | Status: DC | PRN

## 2018-06-24 NOTE — Assessment & Plan Note (Signed)
Can use prn valtrex.  Continue as is.

## 2018-06-24 NOTE — Assessment & Plan Note (Signed)
Still on anticoagulation per coumadin clinic.  Protein C deficiency.  No bruising, no bleeding.  Benefit of treatment with Coumadin likely greatly outweighs the risk of continued treatment.  He agrees.

## 2018-06-24 NOTE — Patient Instructions (Signed)
Get through the holidays and then work on diet and exercise.  If you aren't sleeping well, if you continue to have elevated BP (>140/>90), or if your GERD isn't better, then let me know.  Take care.  Glad to see you.

## 2018-06-24 NOTE — Assessment & Plan Note (Signed)
His snoring and GERD got worse when his weight went up.  BP elevation noted today.  He'll get through the holidays and then work on diet and exercise.  If continuing to have snoring, elevated BP (>140/>90), or GERD in spite of weight loss, then he'll let me know.  He agrees.

## 2018-06-24 NOTE — Assessment & Plan Note (Signed)
Tetanus 2013  Flualready done. PNA d/w pt.   shingles injection done last week.    Living will d/w pt. Wife designated if patient were incapacitated.  Colonoscopy 2019 Prostate cancer screening and PSA options (with potential risks and benefits of testing vs not testing) were discussed along with recent recs/guidelines.  He deferred testing PSA at this point.I asked him to talk with urology about screening in general.  he agreed.  Diet and exercised/w pt.  Weight is up in the meantime. He has plans for diet and exercise, has lost weight prev.  H/o blood donation at [place] with HIV screening that would have been done then.

## 2018-06-24 NOTE — Assessment & Plan Note (Signed)
Living will d/w pt.  Wife designated if patient were incapacitated.   ?

## 2018-06-24 NOTE — Progress Notes (Signed)
CPE- See plan.  Routine anticipatory guidance given to patient.  See health maintenance.  The possibility exists that previously documented standard health maintenance information may have been brought forward from a previous encounter into this note.  If needed, that same information has been updated to reflect the current situation based on today's encounter.    Tetanus 2013  Flualready done. PNA d/w pt.   shingles injection done last week.    Living will d/w pt. Wife designated if patient were incapacitated.  Colonoscopy 2019 Prostate cancer screening and PSA options (with potential risks and benefits of testing vs not testing) were discussed along with recent recs/guidelines.  He deferred testing PSA at this point.I asked him to talk with urology about screening in general.  he agreed.  Diet and exercised/w pt.  Weight is up in the meantime. He has plans for diet and exercise, has lost weight prev.  H/o blood donation at [place] with HIV screening that would have been done then.   His snoring and GERD got worse when his weight went up.  BP elevation noted today.  D/w pt about diet and exercise.     Valtrex used prn.  No ADE on med.  H/o col sores.    His mother died from metastatic small cell lung cancer.  Condolences offered.    Still on anticoagulation per coumadin clinic.  Protein C deficiency.  No bruising, no bleeding.  Benefit of treatment with Coumadin likely greatly outweighs the risk of continued treatment.  He agrees.  PMH and SH reviewed  Meds, vitals, and allergies reviewed.   ROS: Per HPI.  Unless specifically indicated otherwise in HPI, the patient denies:  General: fever. Eyes: acute vision changes ENT: sore throat Cardiovascular: chest pain Respiratory: SOB GI: vomiting GU: dysuria Musculoskeletal: acute back pain Derm: acute rash Neuro: acute motor dysfunction Psych: worsening mood Endocrine: polydipsia Heme: bleeding Allergy:  hayfever  GEN: nad, alert and oriented HEENT: mucous membranes moist NECK: supple w/o LA CV: rrr. PULM: ctab, no inc wob ABD: soft, +bs EXT: no edema SKIN: no acute rash

## 2018-06-28 ENCOUNTER — Other Ambulatory Visit: Payer: Self-pay | Admitting: Family Medicine

## 2018-07-01 NOTE — Telephone Encounter (Signed)
Maximum refill qty for coumadin clinic is #90 days supply with 79mth refills for compliant patients.  This is in place due to safety and need for close monitoring of patient.  Cannot refill beyond 27mths qty.  Thanks.

## 2018-07-02 ENCOUNTER — Other Ambulatory Visit: Payer: Self-pay | Admitting: Family Medicine

## 2018-07-02 NOTE — Telephone Encounter (Signed)
Please refer to denial sent in on 12/27 for increased qty reason:  Maximum refill qty for coumadin clinic is #90 days supply with 87mth refills for compliant patients.  This is in place due to safety and need for close monitoring of patient.  Cannot refill beyond 72mths qty.  Thanks.   Appropriate refill for 90 days sent in on 06/14/18.   Thanks.

## 2018-07-23 ENCOUNTER — Ambulatory Visit: Payer: 59

## 2018-07-23 DIAGNOSIS — Z7901 Long term (current) use of anticoagulants: Secondary | ICD-10-CM | POA: Diagnosis not present

## 2018-07-23 DIAGNOSIS — Z86711 Personal history of pulmonary embolism: Secondary | ICD-10-CM

## 2018-07-23 LAB — POCT INR: INR: 2.7 (ref 2.0–3.0)

## 2018-07-23 NOTE — Patient Instructions (Signed)
INR today: 2.7  Continue to take 3mg  daily.  Re-check in 4-6 weeks.  Patient requests 6 weeks due to being in range past 2 readings.   Patient is doing well without any changes to diet, health or medications.

## 2018-07-24 NOTE — Progress Notes (Signed)
Agree. Thanks

## 2018-09-03 ENCOUNTER — Ambulatory Visit: Payer: 59

## 2018-09-03 DIAGNOSIS — Z23 Encounter for immunization: Secondary | ICD-10-CM

## 2018-09-03 DIAGNOSIS — Z7901 Long term (current) use of anticoagulants: Secondary | ICD-10-CM

## 2018-09-03 DIAGNOSIS — Z86711 Personal history of pulmonary embolism: Secondary | ICD-10-CM

## 2018-09-03 LAB — POCT INR: INR: 1.6 — AB (ref 2.0–3.0)

## 2018-09-03 NOTE — Patient Instructions (Signed)
INR today: 1.6  Take 1.5 pills today (09/03/18) and 1.5 pills tomorrow (3/4) for a boost and then Continue to take 3mg  daily.  Re-check in 4 weeks.  Patient requests 5 weeks due to being out of town in 4 weeks.  Patient is doing well without any changes to diet, health or medications.

## 2018-09-04 NOTE — Progress Notes (Signed)
Agree. Thanks

## 2018-09-08 ENCOUNTER — Other Ambulatory Visit: Payer: Self-pay | Admitting: Family Medicine

## 2018-09-09 NOTE — Telephone Encounter (Signed)
Electronic refill request. Celebrex Last office visit:   06/24/2018 Last Filled:

## 2018-10-01 ENCOUNTER — Ambulatory Visit: Payer: 59

## 2018-10-03 ENCOUNTER — Other Ambulatory Visit: Payer: Self-pay

## 2018-10-03 ENCOUNTER — Ambulatory Visit (INDEPENDENT_AMBULATORY_CARE_PROVIDER_SITE_OTHER): Payer: 59 | Admitting: General Practice

## 2018-10-03 ENCOUNTER — Other Ambulatory Visit (INDEPENDENT_AMBULATORY_CARE_PROVIDER_SITE_OTHER): Payer: 59

## 2018-10-03 DIAGNOSIS — Z79899 Other long term (current) drug therapy: Secondary | ICD-10-CM | POA: Diagnosis not present

## 2018-10-03 DIAGNOSIS — Z86711 Personal history of pulmonary embolism: Secondary | ICD-10-CM

## 2018-10-03 DIAGNOSIS — Z5181 Encounter for therapeutic drug level monitoring: Secondary | ICD-10-CM

## 2018-10-03 DIAGNOSIS — Z7901 Long term (current) use of anticoagulants: Secondary | ICD-10-CM | POA: Diagnosis not present

## 2018-10-03 LAB — POCT INR: INR: 3 (ref 2.0–3.0)

## 2018-10-03 NOTE — Patient Instructions (Addendum)
Pre visit review using our clinic review tool, if applicable. No additional management support is needed unless otherwise documented below in the visit note.  Continue to take 3mg  daily.  Re-check in 4 weeks.  Dosing instructions given to patient and he did verbalized understanding.

## 2018-10-04 NOTE — Progress Notes (Signed)
Agree. Thanks

## 2018-11-05 ENCOUNTER — Other Ambulatory Visit: Payer: Self-pay

## 2018-11-05 MED ORDER — CELECOXIB 200 MG PO CAPS: ORAL_CAPSULE | ORAL | 3 refills | Status: DC

## 2018-11-05 NOTE — Telephone Encounter (Signed)
[facility] Patient Name: Ronald Hatfield Gender: Male DOB: 10/11/1965 Age: 53 Y 1 M 20 D Return Phone Number: [phone] (Primary) Address: City/State/Zip:  [place] [address] Client [facility] Night - Client Client Site [place] Physician Renford Dills - MD Contact Type Call Who Is Calling Patient / Member / Family / Caregiver Call Type Triage / Clinical Relationship To Patient Self Return Phone Number [phone] (Primary) Chief Complaint Leg Pain Reason for Call Medication Question / Request Initial Comment Caller states he needs a prescription for Celebrex renewed. Caller states he has leg and foot pain Translation No Nurse Assessment Nurse: Luvenia Starch, RN, Lonn Georgia Date/Time (Eastern Time): 11/04/2018 8:30:35 PM Please select the assessment type ---Refill Additional Documentation ---Caller states he needs a prescription written for a refill of Celebrex 200mg  [facility] ([phone]) Does the patient have enough medication to last until the office opens? ---No Additional Documentation ---According to directives I encouraged patient to call the office in the morning for a refill since he stated he does not need it tonight. Guidelines Guideline Title Affirmed Question Affirmed Notes Nurse Date/Time (Eastern Time) Disp. Time Eilene Ghazi Time) Disposition Final User 11/04/2018 8:38:43 PM Clinical Call Yes Luvenia Starch, RN, Lonn Georgia Caller Disagree/Comply Comply Caller Understands Yes PreDisposition Call Doctor

## 2018-11-05 NOTE — Telephone Encounter (Signed)
Sent. Thanks.   

## 2018-11-05 NOTE — Telephone Encounter (Signed)
Pharmacy requests refill on: Celebrex 200mg  for leg and foot pain.   LAST REFILL: #90, 3 refills on 08/23/17 LAST OV: 06/14/18 annual  NEXT OV:  12/10/18 annual  [facility] [address]

## 2018-11-07 ENCOUNTER — Ambulatory Visit (INDEPENDENT_AMBULATORY_CARE_PROVIDER_SITE_OTHER): Payer: 59 | Admitting: General Practice

## 2018-11-07 ENCOUNTER — Other Ambulatory Visit (INDEPENDENT_AMBULATORY_CARE_PROVIDER_SITE_OTHER): Payer: 59

## 2018-11-07 DIAGNOSIS — Z86711 Personal history of pulmonary embolism: Secondary | ICD-10-CM | POA: Diagnosis not present

## 2018-11-07 DIAGNOSIS — Z7901 Long term (current) use of anticoagulants: Secondary | ICD-10-CM

## 2018-11-07 LAB — POCT INR: INR: 1.2 — AB (ref 2.0–3.0)

## 2018-11-07 NOTE — Progress Notes (Signed)
Agree. Thanks

## 2018-11-07 NOTE — Patient Instructions (Signed)
Pre visit review using our clinic review tool, if applicable. No additional management support is needed unless otherwise documented below in the visit note.  Take 6 mg today and take 4.5 mg tomorrow and Saturday.  On Sunday continue to take  3mg  daily.  Re-check in 1 to 2 weeks.  Dosing instructions given to patient and he did verbalized understanding.

## 2018-11-21 ENCOUNTER — Other Ambulatory Visit (INDEPENDENT_AMBULATORY_CARE_PROVIDER_SITE_OTHER): Payer: 59

## 2018-11-21 ENCOUNTER — Ambulatory Visit (INDEPENDENT_AMBULATORY_CARE_PROVIDER_SITE_OTHER): Payer: 59 | Admitting: General Practice

## 2018-11-21 DIAGNOSIS — Z86711 Personal history of pulmonary embolism: Secondary | ICD-10-CM | POA: Diagnosis not present

## 2018-11-21 DIAGNOSIS — Z79899 Other long term (current) drug therapy: Secondary | ICD-10-CM

## 2018-11-21 DIAGNOSIS — Z7901 Long term (current) use of anticoagulants: Secondary | ICD-10-CM | POA: Diagnosis not present

## 2018-11-21 DIAGNOSIS — Z5181 Encounter for therapeutic drug level monitoring: Secondary | ICD-10-CM | POA: Diagnosis not present

## 2018-11-21 LAB — POCT INR: INR: 2.8 (ref 2.0–3.0)

## 2018-11-21 NOTE — Patient Instructions (Signed)
Pre visit review using our clinic review tool, if applicable. No additional management support is needed unless otherwise documented below in the visit note.  Continue to take  3mg  daily.  Re-check in 4 weeks.  Dosing instructions given to patient and he did verbalized understanding.

## 2018-11-21 NOTE — Progress Notes (Signed)
Agree. Thanks

## 2018-12-05 ENCOUNTER — Other Ambulatory Visit: Payer: 59

## 2018-12-10 ENCOUNTER — Encounter: Payer: 59 | Admitting: Family Medicine

## 2018-12-19 ENCOUNTER — Ambulatory Visit (INDEPENDENT_AMBULATORY_CARE_PROVIDER_SITE_OTHER): Payer: 59 | Admitting: General Practice

## 2018-12-19 ENCOUNTER — Other Ambulatory Visit: Payer: Self-pay

## 2018-12-19 ENCOUNTER — Other Ambulatory Visit: Payer: 59

## 2018-12-19 DIAGNOSIS — Z86711 Personal history of pulmonary embolism: Secondary | ICD-10-CM

## 2018-12-19 DIAGNOSIS — Z7901 Long term (current) use of anticoagulants: Secondary | ICD-10-CM

## 2018-12-19 LAB — POCT INR: INR: 2.3 (ref 2.0–3.0)

## 2018-12-19 NOTE — Patient Instructions (Addendum)
Pre visit review using our clinic review tool, if applicable. No additional management support is needed unless otherwise documented below in the visit note.  Continue to take  3mg  daily.  Re-check in 4 weeks.

## 2019-01-08 ENCOUNTER — Other Ambulatory Visit: Payer: Self-pay | Admitting: Urology

## 2019-01-08 DIAGNOSIS — N400 Enlarged prostate without lower urinary tract symptoms: Secondary | ICD-10-CM

## 2019-01-12 ENCOUNTER — Encounter: Payer: Self-pay | Admitting: Family Medicine

## 2019-01-17 ENCOUNTER — Other Ambulatory Visit: Payer: Self-pay | Admitting: Family Medicine

## 2019-01-17 NOTE — Telephone Encounter (Signed)
Pt is requesting a refill for Cialis 20 mg. Was prescribed in 06/06/2016 and pt is requesting a refill, 20 tablets. He is out of this med. [facility] [address] [facility] [place].

## 2019-01-19 NOTE — Telephone Encounter (Signed)
It looks like this prescription was previously being handled by urology.  I routed it for urology input.  It looks like he is due for follow-up with urology anyway.  Thanks.

## 2019-01-20 ENCOUNTER — Other Ambulatory Visit: Payer: Self-pay | Admitting: *Deleted

## 2019-01-20 DIAGNOSIS — N400 Enlarged prostate without lower urinary tract symptoms: Secondary | ICD-10-CM

## 2019-01-20 NOTE — Telephone Encounter (Signed)
Faxed refill request. Cialis Last office visit:   06/24/2018 Last Filled:    90 tablet 3 11/13/2017  Please advise.

## 2019-01-20 NOTE — Telephone Encounter (Signed)
Appointment made

## 2019-01-20 NOTE — Telephone Encounter (Signed)
Mr. Pritt will need an office visit prior to refilling his Cialis.

## 2019-01-21 NOTE — Telephone Encounter (Signed)
He needs to follow-up with urology about this.  I will defer to them.  Thanks.

## 2019-01-22 NOTE — Progress Notes (Signed)
9:45 AM   Ronald Hatfield Apr 12, 1966 [id number]  Referring provider: Tonia Ghent, MD [address] [place],   [id number]  Chief Complaint  Patient presents with  . Medication Refill    HPI: 53 yo male with ED, testosterone deficiency and BPH with LU TS who presents today for a one year follow up.  Erectile dysfunction His SHIM score is 13, which is mild to moderate ED.   His previous SHIM score was 14.  He has been having difficulty with erections for five years.   His major complaint is maintaining the erections.  His libido is preserved.   His risk factors for ED are age, BPH, testosterone deficiency, HLD and anticoagulation.  He is having a curvature to his erections.  He is no longer having spontaneous erections.  He has tried PDE5-inhibitors in the past and they have been effective.[name]   SHIM    Row Name 01/23/19 1443         SHIM: Over the last 6 months:   How do you rate your confidence that you could get and keep an erection?  Moderate     When you had erections with sexual stimulation, how often were your erections hard enough for penetration (entering your partner)?  Most Times (much more than half the time)     During sexual intercourse, how often were you able to maintain your erection after you had penetrated (entered) your partner?  Sometimes (about half the time)     During sexual intercourse, how difficult was it to maintain your erection to completion of intercourse?  Very Difficult     When you attempted sexual intercourse, how often was it satisfactory for you?  Almost Never or Never       SHIM Total Score   SHIM  13        Score: 1-7 Severe ED 8-11 Moderate ED 12-16 Mild-Moderate ED 17-21 Mild ED 22-25 No ED   Testosterone deficiency Patient was seen and evaluated by endocrinology and was recommended not to undergo testosterone therapy.   BPH WITH LUTS His IPSS score today is 6, which is mild lower urinary tract symptomatology.  He is  pleased with his quality life due to his urinary symptoms.  Previous I PSS: 9/3.   His major complaint today is intermittency.  He has had these symptoms for the last few years.  He denies any dysuria, hematuria or suprapubic pain.  He was taking Cialis 5 mg daily, but he is in need of a new prescription.  He also denies any recent fevers, chills, nausea or vomiting.  He does not have a family history of PCa. IPSS    Row Name 01/23/19 1400         International Prostate Symptom Score   How often have you had the sensation of not emptying your bladder?  Less than half the time     How often have you had to urinate less than every two hours?  About half the time     How often have you found it difficult to postpone urination?  Not at All     How often have you had a weak urinary stream?  Not at All     How often have you had to strain to start urination?  Less than 1 in 5 times     How many times did you typically get up at night to urinate?  None     Total IPSS Score  6       Quality of Life due to urinary symptoms   If you were to spend the rest of your life with your urinary condition just the way it is now how would you feel about that?  Pleased        Score:  1-7 Mild 8-19 Moderate 20-35 Severe  Abnormal colored urine Patient may or may not of had blood in his urine after a trip from the [place].  He stated his INR's were elevated and once this was corrected the urine returned to normal.  He was advised to undergo a hematuria work up at that time, but he declined.  His UA from 08/2016 and today (09/06/2017) had no AMH.  He does not report any gross hematuria.  Urine has been yellow.   PMH: Past Medical History:  Diagnosis Date  . Attention deficit disorder without mention of hyperactivity   . Depressive disorder, not elsewhere classified   . Dermatophytosis of the body   . Encounter for therapeutic drug monitoring   . History of DVT (deep vein thrombosis)   . Impotence of  organic origin   . 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 Long term (current) use of anticoagulants   . Other abnormal glucose   . Other pulmonary embolism and infarction    Hospitalization 09/12/08-09/16/08  . Overweight(278.02)   . Pure hypercholesterolemia   . RLS (restless legs syndrome)   . Routine general medical examination at a health care facility   . Unspecified viral infection, in conditions classified elsewhere and of unspecified site     Surgical History: Past Surgical History:  Procedure Laterality Date  . LIPOMA EXCISION  1994   Right clavicular head  . VASECTOMY  02/07/02   Dr. Council Mechanic    Home Medications:  Allergies as of 01/23/2019   No Known Allergies     Medication List       Accurate as of January 23, 2019 11:59 PM. If you have any questions, ask your nurse or doctor.        celecoxib 200 MG capsule Commonly known as: CELEBREX TAKE 1 CAPSULE ONCE DAILY  WITH FOOD   tadalafil 20 MG tablet Commonly known as: CIALIS Take 1 tablet (20 mg total) by mouth daily as needed for erectile dysfunction. What changed:   medication strength  how much to take  when to take this  reasons to take this Changed by: Falicia Lizotte, PA-C   valACYclovir 1000 MG tablet Commonly known as: Valtrex Take 2 tablets (2,000 mg total) by mouth 2 (two) times daily as needed (for one day).   warfarin 3 MG tablet Commonly known as: Coumadin Take as directed by the anticoagulation clinic. If you are unsure how to take this medication, talk to your nurse or doctor. Original instructions: TAKE 1 TABLET DAILY OR TAKE AS DIRECTED BY ANTI-COAGULATION CLINIC       Allergies: No Known Allergies  Family History: Family History  Problem Relation Age of Onset  . Alcohol abuse Father   . Deep vein thrombosis Father        h/o phlebitis  . Deep vein thrombosis Mother   . Skin cancer Mother   . Lung cancer Mother   . Deep vein thrombosis  Sister   . Alzheimer's disease Paternal Grandmother   . Alcohol abuse Maternal Grandfather   . Diabetes Maternal Grandmother   . Coronary artery disease Maternal Grandmother   . Stroke Maternal  Grandmother   . Prostate cancer Neg Hx   . Colon cancer Neg Hx   . Other Neg Hx        pituitary dz    Social History:  reports that he has never smoked. He has quit using smokeless tobacco.  His smokeless tobacco use included chew. He reports current alcohol use. He reports that he does not use drugs.  ROS: UROLOGY Frequent Urination?: No Hard to postpone urination?: No Burning/pain with urination?: No Get up at night to urinate?: No Leakage of urine?: No Urine stream starts and stops?: No Trouble starting stream?: No Do you have to strain to urinate?: No Blood in urine?: No Urinary tract infection?: No Sexually transmitted disease?: No Injury to kidneys or bladder?: No Painful intercourse?: No Weak stream?: No Erection problems?: No Penile pain?: No  Gastrointestinal Nausea?: No Vomiting?: No Indigestion/heartburn?: No Diarrhea?: No Constipation?: No  Constitutional Fever: No Night sweats?: No Weight loss?: No Fatigue?: No  Skin Skin rash/lesions?: No Itching?: No  Eyes Blurred vision?: No Double vision?: No  Ears/Nose/Throat Sore throat?: No Sinus problems?: No  Hematologic/Lymphatic Swollen glands?: No Easy bruising?: No  Cardiovascular Leg swelling?: No Chest pain?: No  Respiratory Cough?: No Shortness of breath?: No  Endocrine Excessive thirst?: No  Musculoskeletal Back pain?: No Joint pain?: No  Neurological Headaches?: No Dizziness?: No  Psychologic Depression?: No Anxiety?: No  Physical Exam: Ht 6' (1.829 m)   Wt 235 lb (106.6 kg)   BMI 31.87 kg/m   Constitutional:  Well nourished. Alert and oriented, No acute distress. HEENT: [place] AT, moist mucus membranes.  Trachea midline, no masses. Cardiovascular: No clubbing, cyanosis, or  edema. Respiratory: Normal respiratory effort, no increased work of breathing. GI: Abdomen is soft, non tender, non distended, no abdominal masses. Liver and spleen not palpable.  No hernias appreciated.  Stool sample for occult testing is not indicated.   GU: No CVA tenderness.  No bladder fullness or masses.  Patient with circumcised phallus.  Urethral meatus is patent.  No penile discharge. No penile lesions or rashes. Scrotum without lesions, cysts, rashes and/or edema.  Testicles are located scrotally bilaterally. No masses are appreciated in the testicles. Left and right epididymis are normal. Rectal: Patient with  normal sphincter tone. Anus and perineum without scarring or rashes. No rectal masses are appreciated. Prostate is approximately 50 grams, could only palpate the apex, no nodules are appreciated.  Lymph: No inguinal adenopathy. Neurologic: Grossly intact, no focal deficits, moving all 4 extremities. Psychiatric: Normal mood and affect.   Laboratory Data: PSA History  0.66 ng/mL on 06/13/2014  0.90 ng/mL on 06/14/2015  0.77 ng/mL on 06/13/2016  0.70 ng/mL on 09/06/2017    Results for orders placed or performed in visit on 01/23/19  PSA  Result Value Ref Range   Prostate Specific Ag, Serum 0.9 0.0 - 4.0 ng/mL    Assessment & Plan:    1. Erectile dysfunction  - SHIM score is 14, it is improved  - Continue Cialis 20 mg on demand dosing; refills given  - RTC in 12 months for repeat SHIM score and exam   2. BPH with LUTS  - IPSS score is 6/1, it is improved  - Continue conservative management, avoiding bladder irritants and timed voiding's  - Continue Cialis 5 mg daily as prescribed by his PCP  - RTC in 12 months for I PSS and exam    Return in about 1 year (around 01/23/2020) for IPSS, SHIM, PSA and  exam.  These notes generated with voice recognition software. I apologize for typographical errors.  Zara Council, PA-C  [facility] [address] [place], [place] [address] [phone]

## 2019-01-23 ENCOUNTER — Encounter: Payer: Self-pay | Admitting: Urology

## 2019-01-23 ENCOUNTER — Ambulatory Visit (INDEPENDENT_AMBULATORY_CARE_PROVIDER_SITE_OTHER): Payer: 59 | Admitting: General Practice

## 2019-01-23 ENCOUNTER — Other Ambulatory Visit: Payer: Self-pay

## 2019-01-23 ENCOUNTER — Ambulatory Visit: Payer: 59 | Admitting: Urology

## 2019-01-23 VITALS — Ht 72.0 in | Wt 235.0 lb

## 2019-01-23 DIAGNOSIS — Z7901 Long term (current) use of anticoagulants: Secondary | ICD-10-CM

## 2019-01-23 DIAGNOSIS — N529 Male erectile dysfunction, unspecified: Secondary | ICD-10-CM | POA: Diagnosis not present

## 2019-01-23 DIAGNOSIS — N138 Other obstructive and reflux uropathy: Secondary | ICD-10-CM

## 2019-01-23 DIAGNOSIS — N401 Enlarged prostate with lower urinary tract symptoms: Secondary | ICD-10-CM

## 2019-01-23 DIAGNOSIS — Z86711 Personal history of pulmonary embolism: Secondary | ICD-10-CM

## 2019-01-23 LAB — POCT INR: INR: 1.4 — AB (ref 2.0–3.0)

## 2019-01-23 MED ORDER — TADALAFIL 20 MG PO TABS: 20.0000 mg | ORAL_TABLET | Freq: Every day | ORAL | 11 refills | Status: DC | PRN

## 2019-01-23 NOTE — Patient Instructions (Addendum)
Pre visit review using our clinic review tool, if applicable. No additional management support is needed unless otherwise documented below in the visit note.  Take 2 tablets today and tomorrow and then continue to take  3mg  daily.  Re-check in 1 to 2 weeks.

## 2019-01-24 LAB — PSA: Prostate Specific Ag, Serum: 0.9 ng/mL (ref 0.0–4.0)

## 2019-02-13 ENCOUNTER — Ambulatory Visit (INDEPENDENT_AMBULATORY_CARE_PROVIDER_SITE_OTHER): Payer: 59 | Admitting: General Practice

## 2019-02-13 ENCOUNTER — Telehealth: Payer: Self-pay | Admitting: Family Medicine

## 2019-02-13 DIAGNOSIS — Z7901 Long term (current) use of anticoagulants: Secondary | ICD-10-CM

## 2019-02-13 LAB — POCT INR: INR: 1.9 — AB (ref 2.0–3.0)

## 2019-02-13 NOTE — Patient Instructions (Addendum)
Pre visit review using our clinic review tool, if applicable. No additional management support is needed unless otherwise documented below in the visit note.  Take 1 1/2 tablets today and then continue to take  3mg  daily.  Re-check in 3 to 4 weeks. (Patient missed a dose of warfarin yesterday).

## 2019-02-13 NOTE — Telephone Encounter (Signed)
Patient advised.

## 2019-02-13 NOTE — Telephone Encounter (Signed)
I don't advise him to go to a gym for exercise.  At this point, going would be against medical advice and I would find alternative forms of exercise.

## 2019-02-13 NOTE — Telephone Encounter (Signed)
-----   Message from Warden Fillers, RN sent at 02/13/2019 11:42 AM EDT ----- Regarding: Note for Gym Hi Dr. Damita Dunnings,  Patient would like a note from you stating the he needs to be able to work out at the gym for medical reasons.  Could you send the note to his My Chart please?  Thanks! Jenny Reichmann

## 2019-02-16 NOTE — Progress Notes (Signed)
Agree. Thanks

## 2019-03-05 ENCOUNTER — Other Ambulatory Visit: Payer: Self-pay | Admitting: Family Medicine

## 2019-03-06 ENCOUNTER — Other Ambulatory Visit: Payer: Self-pay | Admitting: Family Medicine

## 2019-03-06 MED ORDER — TADALAFIL 20 MG PO TABS: 20.0000 mg | ORAL_TABLET | Freq: Every day | ORAL | 11 refills | Status: DC | PRN

## 2019-03-06 NOTE — Telephone Encounter (Signed)
Patient is compliant with coumadin management will refill X 6mths.   

## 2019-03-12 ENCOUNTER — Telehealth: Payer: Self-pay

## 2019-03-12 NOTE — Telephone Encounter (Signed)
Called patient to go over screening for his appointment on 03/13/2019. Patient at that time advised me that he has been holding his Coumadin since 03/09/2019 due to having dental procedure tomorrow afternoon-03/13/2019. He will re starts Coumadin tomorrow afternoon at his original dose 3 mg daily. Zigmund Gottron and r/s patient for 03/18/2019.

## 2019-03-13 ENCOUNTER — Telehealth: Payer: Self-pay | Admitting: General Practice

## 2019-03-13 ENCOUNTER — Ambulatory Visit: Payer: 59

## 2019-03-13 NOTE — Telephone Encounter (Signed)
LMOVM for patient to take an extra 1.5 mg of coumadin for two days after dental work and then resume taking 3 mg daily.  Please keep appointment on 9/15 to check INR

## 2019-03-13 NOTE — Telephone Encounter (Signed)
I left a VM for patient to boost for 2 days.  I was also unaware of any procedures.

## 2019-03-13 NOTE — Telephone Encounter (Signed)
Jenny Reichmann,  Will you have time to try and reach patient to discuss possibly having him take boost dosing for a couple of days before I recheck him next week?  I do not believe (unless he notified you at last appointment) that he shared with [place] that he would be holding his medication this week for his procedure.   I was unaware and he has not been given any boost instructions.   Any help you could give on this would be great.  Thanks.

## 2019-03-14 NOTE — Telephone Encounter (Signed)
Agreed.  I was not aware of any pending dental procedure.

## 2019-03-18 ENCOUNTER — Ambulatory Visit (INDEPENDENT_AMBULATORY_CARE_PROVIDER_SITE_OTHER): Payer: 59

## 2019-03-18 ENCOUNTER — Other Ambulatory Visit: Payer: Self-pay

## 2019-03-18 DIAGNOSIS — Z86711 Personal history of pulmonary embolism: Secondary | ICD-10-CM

## 2019-03-18 DIAGNOSIS — N401 Enlarged prostate with lower urinary tract symptoms: Secondary | ICD-10-CM

## 2019-03-18 DIAGNOSIS — Z7901 Long term (current) use of anticoagulants: Secondary | ICD-10-CM | POA: Diagnosis not present

## 2019-03-18 DIAGNOSIS — N138 Other obstructive and reflux uropathy: Secondary | ICD-10-CM

## 2019-03-18 LAB — POCT INR: INR: 1.6 — AB (ref 2.0–3.0)

## 2019-03-18 MED ORDER — TADALAFIL 5 MG PO TABS: 5.0000 mg | ORAL_TABLET | Freq: Every day | ORAL | 11 refills | Status: DC

## 2019-03-18 NOTE — Patient Instructions (Signed)
INR today: 1.6   Patient is to Take 1 1/2 tablets today (9/15) and tomorrow (9/16) and then continue to take  3mg  daily.  Re-check in 1.5 weeks.  Note: patient did not receive his boost instructions that were left on his vm on Thursday so he just resumed 3mg  daily on Thursday.

## 2019-03-18 NOTE — Telephone Encounter (Signed)
See mychart documentation, pt requested RX for 5mg  (per Shannon's note to take 5mg  daily in addition to 20mg  PRN) RX sent for 5mg .

## 2019-03-19 NOTE — Progress Notes (Addendum)
Agree. Thanks

## 2019-03-27 ENCOUNTER — Ambulatory Visit (INDEPENDENT_AMBULATORY_CARE_PROVIDER_SITE_OTHER): Payer: 59 | Admitting: General Practice

## 2019-03-27 DIAGNOSIS — Z7901 Long term (current) use of anticoagulants: Secondary | ICD-10-CM | POA: Diagnosis not present

## 2019-03-27 LAB — POCT INR: INR: 2.5 (ref 2.0–3.0)

## 2019-03-27 NOTE — Patient Instructions (Signed)
Pre visit review using our clinic review tool, if applicable. No additional management support is needed unless otherwise documented below in the visit note.  Please continue to take 1 (3 mg) tablet daily and re-check in 4 weeks.

## 2019-03-29 NOTE — Progress Notes (Signed)
Agree. Thanks

## 2019-04-07 ENCOUNTER — Other Ambulatory Visit: Payer: 59

## 2019-04-10 ENCOUNTER — Encounter: Payer: 59 | Admitting: Family Medicine

## 2019-04-24 ENCOUNTER — Ambulatory Visit: Payer: 59 | Admitting: General Practice

## 2019-04-24 ENCOUNTER — Other Ambulatory Visit: Payer: Self-pay

## 2019-04-24 DIAGNOSIS — Z86711 Personal history of pulmonary embolism: Secondary | ICD-10-CM

## 2019-04-24 DIAGNOSIS — Z7901 Long term (current) use of anticoagulants: Secondary | ICD-10-CM | POA: Diagnosis not present

## 2019-04-24 LAB — POCT INR: INR: 2.6 (ref 2.0–3.0)

## 2019-04-24 NOTE — Patient Instructions (Signed)
Pre visit review using our clinic review tool, if applicable. No additional management support is needed unless otherwise documented below in the visit note.  Please continue to take 1 (3 mg) tablet daily and re-check in 6 weeks.

## 2019-04-27 NOTE — Progress Notes (Signed)
Agree. Thanks

## 2019-05-18 ENCOUNTER — Other Ambulatory Visit: Payer: Self-pay | Admitting: Family Medicine

## 2019-05-18 DIAGNOSIS — E7800 Pure hypercholesterolemia, unspecified: Secondary | ICD-10-CM

## 2019-05-18 DIAGNOSIS — D6859 Other primary thrombophilia: Secondary | ICD-10-CM

## 2019-05-20 ENCOUNTER — Other Ambulatory Visit (INDEPENDENT_AMBULATORY_CARE_PROVIDER_SITE_OTHER): Payer: 59

## 2019-05-20 DIAGNOSIS — D6859 Other primary thrombophilia: Secondary | ICD-10-CM | POA: Diagnosis not present

## 2019-05-20 DIAGNOSIS — E7800 Pure hypercholesterolemia, unspecified: Secondary | ICD-10-CM | POA: Diagnosis not present

## 2019-05-20 LAB — COMPREHENSIVE METABOLIC PANEL
ALT: 23 U/L (ref 0–53)
AST: 15 U/L (ref 0–37)
Albumin: 4 g/dL (ref 3.5–5.2)
Alkaline Phosphatase: 49 U/L (ref 39–117)
BUN: 16 mg/dL (ref 6–23)
CO2: 26 mEq/L (ref 19–32)
Calcium: 8.8 mg/dL (ref 8.4–10.5)
Chloride: 105 mEq/L (ref 96–112)
Creatinine, Ser: 1.09 mg/dL (ref 0.40–1.50)
GFR: 70.06 mL/min (ref >60.00)
Glucose, Bld: 100 mg/dL — ABNORMAL HIGH (ref 70–99)
Potassium: 3.9 mEq/L (ref 3.5–5.1)
Sodium: 139 mEq/L (ref 135–145)
Total Bilirubin: 0.8 mg/dL (ref 0.2–1.2)
Total Protein: 6.6 g/dL (ref 6.0–8.3)

## 2019-05-20 LAB — CBC WITH DIFFERENTIAL/PLATELET
Basophils Absolute: 0.1 10*3/uL (ref 0.0–0.1)
Basophils Relative: 1.1 % (ref 0.0–3.0)
Eosinophils Absolute: 0.6 10*3/uL (ref 0.0–0.7)
Eosinophils Relative: 9.6 % — ABNORMAL HIGH (ref 0.0–5.0)
HCT: 45.2 % (ref 39.0–52.0)
Hemoglobin: 15.7 g/dL (ref 13.0–17.0)
Lymphocytes Relative: 26.7 % (ref 12.0–46.0)
Lymphs Abs: 1.7 10*3/uL (ref 0.7–4.0)
MCHC: 34.8 g/dL (ref 30.0–36.0)
MCV: 87.7 fl (ref 78.0–100.0)
Monocytes Absolute: 0.5 10*3/uL (ref 0.1–1.0)
Monocytes Relative: 8.1 % (ref 3.0–12.0)
Neutro Abs: 3.5 10*3/uL (ref 1.4–7.7)
Neutrophils Relative %: 54.5 % (ref 43.0–77.0)
Platelets: 239 10*3/uL (ref 150.0–400.0)
RBC: 5.15 Mil/uL (ref 4.22–5.81)
RDW: 12.8 % (ref 11.5–15.5)
WBC: 6.5 10*3/uL (ref 4.0–10.5)

## 2019-05-20 LAB — LIPID PANEL
Cholesterol: 226 mg/dL — ABNORMAL HIGH (ref 0–200)
HDL: 48.6 mg/dL (ref >39.00)
LDL Cholesterol: 146 mg/dL — ABNORMAL HIGH (ref 0–99)
NonHDL: 177.51
Total CHOL/HDL Ratio: 5
Triglycerides: 157 mg/dL — ABNORMAL HIGH (ref 0.0–149.0)
VLDL: 31.4 mg/dL (ref 0.0–40.0)

## 2019-05-27 ENCOUNTER — Other Ambulatory Visit: Payer: Self-pay

## 2019-05-27 ENCOUNTER — Ambulatory Visit: Payer: 59 | Admitting: Family Medicine

## 2019-05-27 ENCOUNTER — Encounter: Payer: Self-pay | Admitting: Family Medicine

## 2019-05-27 VITALS — BP 122/80 | HR 73 | Temp 97.3°F | Ht 72.0 in | Wt 262.2 lb

## 2019-05-27 DIAGNOSIS — Z0000 Encounter for general adult medical examination without abnormal findings: Secondary | ICD-10-CM

## 2019-05-27 DIAGNOSIS — D6859 Other primary thrombophilia: Secondary | ICD-10-CM

## 2019-05-27 DIAGNOSIS — Z23 Encounter for immunization: Secondary | ICD-10-CM

## 2019-05-27 DIAGNOSIS — Z7189 Other specified counseling: Secondary | ICD-10-CM

## 2019-05-27 DIAGNOSIS — K219 Gastro-esophageal reflux disease without esophagitis: Secondary | ICD-10-CM

## 2019-05-27 MED ORDER — VALACYCLOVIR HCL 1 G PO TABS: 2000.0000 mg | ORAL_TABLET | Freq: Two times a day (BID) | ORAL | 12 refills | Status: DC | PRN

## 2019-05-27 NOTE — Patient Instructions (Addendum)
Avoid eating late at night and keep working on diet and exercise.  If needed, try omeprazole 20mg  a day, OTC.   If not better, then let us know.  Thanks for getting a flu shot.  Take care.  Glad to see you.

## 2019-05-27 NOTE — Progress Notes (Signed)
This visit occurred during the SARS-CoV-2 public health emergency.  Safety protocols were in place, including screening questions prior to the visit, additional usage of staff PPE, and extensive cleaning of exam room while observing appropriate contact time as indicated for disinfecting solutions.    CPE- See plan.  Routine anticipatory guidance given to patient.  See health maintenance.  The possibility exists that previously documented standard health maintenance information may have been brought forward from a previous encounter into this note.  If needed, that same information has been updated to reflect the current situation based on today's encounter.    Tetanus 2013  Flu2020 PNA d/w pt.   shingrix done.    Living will d/w pt. Wife designated if patient were incapacitated.  Colonoscopy 2019 PSA 2020.   Diet and exercised/w pt.  Weight is up in the meantime. He has plans for diet and exercise, has lost weight prev.  H/o blood donation at [facility] with HIV screening that would have been done then.   Likely to have more GERD sx when supine.  More belching.  Not exertional.  D/w pt about his weight, he had regained some weight.  Symptoms had previously resolved when his weight was lower. Diet and exercise affected with pandemic.  D/w pt.    No bleeding, still anticoagulated.  No blood in stool/urine.  No FCNAVD.   Labs discussed with patient at office visit.   PMH and SH reviewed  Meds, vitals, and allergies reviewed.   ROS: Per HPI.  Unless specifically indicated otherwise in HPI, the patient denies:  General: fever. Eyes: acute vision changes ENT: sore throat Cardiovascular: chest pain Respiratory: SOB GI: vomiting GU: dysuria Musculoskeletal: acute back pain Derm: acute rash Neuro: acute motor dysfunction Psych: worsening mood Endocrine: polydipsia Heme: bleeding Allergy: hayfever  GEN: nad, alert and oriented HEENT: ncat NECK: supple w/o LA CV: rrr. PULM:  ctab, no inc wob ABD: soft, +bs EXT: no edema SKIN: no acute rash

## 2019-05-28 DIAGNOSIS — K219 Gastro-esophageal reflux disease without esophagitis: Secondary | ICD-10-CM | POA: Insufficient documentation

## 2019-05-28 NOTE — Assessment & Plan Note (Signed)
No bleeding, still anticoagulated.  No blood in stool/urine.  No FCNAVD.  Reasonable to continue.  He agrees.

## 2019-05-28 NOTE — Assessment & Plan Note (Signed)
Likely to have more GERD sx when supine.  More belching.  Not exertional.  D/w pt about his weight, he had regained some weight.  Symptoms had previously resolved when his weight was lower. Diet and exercise affected with pandemic.  D/w pt about avoiding eating late at night and to keep working on diet and exercise.  If needed, try omeprazole 20mg  a day, OTC.   If not better, then let us know.  He agrees.

## 2019-05-28 NOTE — Assessment & Plan Note (Signed)
Tetanus 2013  Flu2020 PNA d/w pt.   shingrix done.    Living will d/w pt. Wife designated if patient were incapacitated.  Colonoscopy 2019 PSA 2020.   Diet and exercised/w pt.  Weight is up in the meantime. He has plans for diet and exercise, has lost weight prev.  H/o blood donation at [place] with HIV screening that would have been done then.

## 2019-05-28 NOTE — Assessment & Plan Note (Signed)
Living will d/w pt.  Wife designated if patient were incapacitated.   ?

## 2019-06-05 ENCOUNTER — Other Ambulatory Visit: Payer: Self-pay

## 2019-06-05 ENCOUNTER — Ambulatory Visit: Payer: 59 | Admitting: General Practice

## 2019-06-05 DIAGNOSIS — Z7901 Long term (current) use of anticoagulants: Secondary | ICD-10-CM | POA: Diagnosis not present

## 2019-06-05 LAB — POCT INR: INR: 2.8 (ref 2.0–3.0)

## 2019-06-05 NOTE — Patient Instructions (Addendum)
Pre visit review using our clinic review tool, if applicable. No additional management support is needed unless otherwise documented below in the visit note. Please continue to take 1 (3 mg) tablet daily and re-check in 6 weeks.

## 2019-06-06 NOTE — Progress Notes (Signed)
Agree. Thanks

## 2019-07-17 ENCOUNTER — Other Ambulatory Visit: Payer: Self-pay

## 2019-07-17 ENCOUNTER — Ambulatory Visit: Payer: 59

## 2019-07-17 DIAGNOSIS — Z7901 Long term (current) use of anticoagulants: Secondary | ICD-10-CM

## 2019-07-17 DIAGNOSIS — Z86711 Personal history of pulmonary embolism: Secondary | ICD-10-CM

## 2019-07-17 LAB — POCT INR: INR: 2 (ref 2.0–3.0)

## 2019-07-17 NOTE — Patient Instructions (Addendum)
Pre visit review using our clinic review tool, if applicable. No additional management support is needed unless otherwise documented below in the visit note.  Take 4.5mg  today and continue current dosing of 3mg  daily. Return in 6 wks.

## 2019-07-17 NOTE — Progress Notes (Signed)
Agree. Thanks

## 2019-07-23 ENCOUNTER — Encounter: Payer: Self-pay | Admitting: Family Medicine

## 2019-08-28 ENCOUNTER — Ambulatory Visit: Payer: 59

## 2019-09-01 ENCOUNTER — Other Ambulatory Visit: Payer: Self-pay | Admitting: Family Medicine

## 2019-09-02 ENCOUNTER — Ambulatory Visit: Payer: 59

## 2019-09-02 ENCOUNTER — Other Ambulatory Visit: Payer: Self-pay

## 2019-09-02 DIAGNOSIS — Z7901 Long term (current) use of anticoagulants: Secondary | ICD-10-CM | POA: Diagnosis not present

## 2019-09-02 DIAGNOSIS — Z86711 Personal history of pulmonary embolism: Secondary | ICD-10-CM

## 2019-09-02 LAB — POCT INR: INR: 3 (ref 2.0–3.0)

## 2019-09-02 NOTE — Patient Instructions (Addendum)
Pre visit review using our clinic review tool, if applicable. No additional management support is needed unless otherwise documented below in the visit note.  Continue current dosing of 3mg  daily. Return in 6 wks.

## 2019-09-03 NOTE — Telephone Encounter (Signed)
Pt compliant with coumadin management. Sent in script 

## 2019-09-03 NOTE — Progress Notes (Signed)
Agree. Thanks

## 2019-10-16 ENCOUNTER — Other Ambulatory Visit: Payer: Self-pay

## 2019-10-16 ENCOUNTER — Ambulatory Visit: Payer: 59

## 2019-10-16 DIAGNOSIS — Z7901 Long term (current) use of anticoagulants: Secondary | ICD-10-CM

## 2019-10-16 DIAGNOSIS — Z86711 Personal history of pulmonary embolism: Secondary | ICD-10-CM

## 2019-10-16 LAB — POCT INR: INR: 3.3 — AB (ref 2.0–3.0)

## 2019-10-16 NOTE — Patient Instructions (Addendum)
Pre visit review using our clinic review tool, if applicable. No additional management support is needed unless otherwise documented below in the visit note.  Hold dose tomorrow due to taking dose today already then continue current dosing of 3mg  daily. Return in 4 wks.

## 2019-10-17 NOTE — Progress Notes (Signed)
Agree. Thanks

## 2019-11-13 ENCOUNTER — Ambulatory Visit (INDEPENDENT_AMBULATORY_CARE_PROVIDER_SITE_OTHER): Payer: 59

## 2019-11-13 ENCOUNTER — Other Ambulatory Visit: Payer: Self-pay

## 2019-11-13 DIAGNOSIS — Z86711 Personal history of pulmonary embolism: Secondary | ICD-10-CM

## 2019-11-13 DIAGNOSIS — Z7901 Long term (current) use of anticoagulants: Secondary | ICD-10-CM | POA: Diagnosis not present

## 2019-11-13 LAB — POCT INR: INR: 3.3 — AB (ref 2.0–3.0)

## 2019-11-13 NOTE — Patient Instructions (Addendum)
Pre visit review using our clinic review tool, if applicable. No additional management support is needed unless otherwise documented below in the visit note.  Hold dose tomorrow due to taking dose today already then change weekly dosing to take 3mg  daily except 1.5mg  on Sat. Return in 3 wks.

## 2019-11-17 NOTE — Progress Notes (Signed)
Agree. Thanks

## 2019-11-30 ENCOUNTER — Other Ambulatory Visit: Payer: Self-pay | Admitting: Family Medicine

## 2019-12-04 ENCOUNTER — Other Ambulatory Visit: Payer: Self-pay

## 2019-12-04 ENCOUNTER — Ambulatory Visit: Payer: 59

## 2019-12-04 DIAGNOSIS — Z86711 Personal history of pulmonary embolism: Secondary | ICD-10-CM

## 2019-12-04 DIAGNOSIS — Z7901 Long term (current) use of anticoagulants: Secondary | ICD-10-CM | POA: Diagnosis not present

## 2019-12-04 LAB — POCT INR: INR: 2 (ref 2.0–3.0)

## 2019-12-04 MED ORDER — WARFARIN SODIUM 3 MG PO TABS: ORAL_TABLET | ORAL | 0 refills | Status: DC

## 2019-12-04 NOTE — Patient Instructions (Addendum)
Pre visit review using our clinic review tool, if applicable. No additional management support is needed unless otherwise documented below in the visit note.   Continue to take 3mg  daily except 1.5mg  on Sat. Return in 5 wks.

## 2019-12-07 NOTE — Progress Notes (Signed)
Agree. Thanks

## 2019-12-08 ENCOUNTER — Other Ambulatory Visit: Payer: Self-pay | Admitting: Family Medicine

## 2019-12-08 NOTE — Telephone Encounter (Signed)
Electronic refill request. Valacyclovir Last office visit:   05/27/2019 Last Filled:    20 tablet 12 05/27/2019  Please advise.

## 2019-12-09 NOTE — Telephone Encounter (Signed)
Sent. Thanks.   

## 2020-01-08 ENCOUNTER — Other Ambulatory Visit: Payer: Self-pay

## 2020-01-08 ENCOUNTER — Ambulatory Visit: Payer: 59

## 2020-01-08 DIAGNOSIS — Z7901 Long term (current) use of anticoagulants: Secondary | ICD-10-CM | POA: Diagnosis not present

## 2020-01-08 LAB — POCT INR: INR: 1.9 — AB (ref 2.0–3.0)

## 2020-01-08 NOTE — Patient Instructions (Addendum)
Pre visit review using our clinic review tool, if applicable. No additional management support is needed unless otherwise documented below in the visit note.  Increase dose today to 4.5mg  then continue to take 3mg  daily except 1.5mg  on Sat.

## 2020-01-20 ENCOUNTER — Other Ambulatory Visit: Payer: Self-pay | Admitting: *Deleted

## 2020-01-20 DIAGNOSIS — N401 Enlarged prostate with lower urinary tract symptoms: Secondary | ICD-10-CM

## 2020-01-21 ENCOUNTER — Other Ambulatory Visit: Payer: Self-pay

## 2020-01-21 ENCOUNTER — Other Ambulatory Visit: Payer: 59

## 2020-01-21 DIAGNOSIS — N138 Other obstructive and reflux uropathy: Secondary | ICD-10-CM

## 2020-01-22 LAB — PSA: Prostate Specific Ag, Serum: 0.9 ng/mL (ref 0.0–4.0)

## 2020-01-27 NOTE — Progress Notes (Signed)
9:44 PM   Ronald Hatfield [birth year] [id number]  Referring provider: Tonia Ghent, MD [address] [place],  [place] [address]  Chief Complaint  Patient presents with  . Follow-up    HPI: 54 yo male with ED, testosterone deficiency and BPH with LU TS who presents today for a one year follow up.  Erectile dysfunction His SHIM score is 12, which is mild to moderate ED.   His previous SHIM score was 13.  He has been having difficulty with erections for five years.   His major complaint is maintaining the erections.  His libido is preserved.   His risk factors for ED are age, BPH, testosterone deficiency, HLD and anticoagulation.  He is having a curvature to his erections.  He is no longer having spontaneous erections.  He has tried PDE5-inhibitors in the past and they have been effective.   SHIM    Row Name 01/28/20 1510         SHIM: Over the last 6 months:   How do you rate your confidence that you could get and keep an erection? Low     When you had erections with sexual stimulation, how often were your erections hard enough for penetration (entering your partner)? Most Times (much more than half the time)     During sexual intercourse, how often were you able to maintain your erection after you had penetrated (entered) your partner? Sometimes (about half the time)     During sexual intercourse, how difficult was it to maintain your erection to completion of intercourse? Extremely Difficult     When you attempted sexual intercourse, how often was it satisfactory for you? A Few Times (much less than half the time)       SHIM Total Score   SHIM 12            Score: 1-7 Severe ED 8-11 Moderate ED 12-16 Mild-Moderate ED 17-21 Mild ED 22-25 No ED   Testosterone deficiency Patient was seen and evaluated by endocrinology and was recommended not to undergo testosterone therapy.   BPH WITH LUTS His IPSS score today is 11, which is moderate lower urinary tract  symptomatology.  He is pleased with his quality life due to his urinary symptoms.  Previous I PSS: 6/1.   His major complaint today is a weak stream.  Currently taking Cialis 5 mg daily.  Patient denies any modifying or aggravating factors.  Patient denies any gross hematuria, dysuria or suprapubic/flank pain.  Patient denies any fevers, chills, nausea or vomiting.    IPSS    Row Name 01/28/20 1500         International Prostate Symptom Score   How often have you had the sensation of not emptying your bladder? Less than half the time     How often have you had to urinate less than every two hours? About half the time     How often have you found you stopped and started again several times when you urinated? Less than 1 in 5 times     How often have you found it difficult to postpone urination? Not at All     How often have you had a weak urinary stream? About half the time     How often have you had to strain to start urination? Not at All     How many times did you typically get up at night to urinate? 2 Times     Total IPSS  Score 11       Quality of Life due to urinary symptoms   If you were to spend the rest of your life with your urinary condition just the way it is now how would you feel about that? Pleased            Score:  1-7 Mild 8-19 Moderate 20-35 Severe  Abnormal colored urine Patient may or may not of had blood in his urine after a trip from the [place].  He stated his INR's were elevated and once this was corrected the urine returned to normal.  He was advised to undergo a hematuria work up at that time, but he declined.  His UA from 08/2016 and 09/06/2017 had no AMH.  He has been noticing darker colored urine while playing golf that clears when he increases his water intake.  He denies pink or red colored urine, but it does appear brown at times.  His UA today is negative for micro heme.    PMH: Past Medical History:  Diagnosis Date  . Attention deficit disorder  without mention of hyperactivity   . Depressive disorder, not elsewhere classified   . Dermatophytosis of the body   . Encounter for therapeutic drug monitoring   . History of DVT (deep vein thrombosis)   . Impotence of organic origin   . 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 Long term (current) use of anticoagulants   . Other abnormal glucose   . Other pulmonary embolism and infarction    Hospitalization 09/12/08-09/16/08  . Overweight(278.02)   . Pure hypercholesterolemia   . RLS (restless legs syndrome)   . Routine general medical examination at a health care facility   . Unspecified viral infection, in conditions classified elsewhere and of unspecified site     Surgical History: Past Surgical History:  Procedure Laterality Date  . LIPOMA EXCISION  1994   Right clavicular head  . VASECTOMY  02/07/02   Dr. Council Mechanic    Home Medications:  Allergies as of 01/28/2020   No Known Allergies     Medication List       Accurate as of January 28, 2020 11:59 PM. If you have any questions, ask your nurse or doctor.        celecoxib 200 MG capsule Commonly known as: CELEBREX TAKE ONE CAPSULE BY MOUTH DAILY WITH FOOD   cetirizine 10 MG tablet Commonly known as: ZYRTEC Take 10 mg by mouth as needed for allergies.   tadalafil 20 MG tablet Commonly known as: CIALIS Take 1 tablet (20 mg total) by mouth daily as needed for erectile dysfunction.   tadalafil 5 MG tablet Commonly known as: CIALIS Take 1 tablet (5 mg total) by mouth daily.   valACYclovir 1000 MG tablet Commonly known as: VALTREX TAKE TWO TABLETS BY MOUTH TWICE A DAY AS NEEDED (FOR ONE DAY)   warfarin 3 MG tablet Commonly known as: COUMADIN Take as directed by the anticoagulation clinic. If you are unsure how to take this medication, talk to your nurse or doctor. Original instructions: Take 3mg  daily except take 1.5mg  on Sat or as instructed by coumadin clinic.        Allergies: No Known Allergies  Family History: Family History  Problem Relation Age of Onset  . Alcohol abuse Father   . Deep vein thrombosis Father        h/o phlebitis  . Deep vein thrombosis Mother   . Skin cancer Mother   .  Lung cancer Mother   . Deep vein thrombosis Sister   . Alzheimer's disease Paternal Grandmother   . Alcohol abuse Maternal Grandfather   . Diabetes Maternal Grandmother   . Coronary artery disease Maternal Grandmother   . Stroke Maternal Grandmother   . Prostate cancer Neg Hx   . Colon cancer Neg Hx   . Other Neg Hx        pituitary dz    Social History:  reports that he has never smoked. He has quit using smokeless tobacco.  His smokeless tobacco use included chew. He reports current alcohol use. He reports that he does not use drugs.  ROS: For pertinent review of systems please refer to history of present illness  Physical Exam: BP (!) 143/87 (BP Location: Left Arm, Patient Position: Sitting, Cuff Size: Normal)   Pulse 72   Ht 5\' 11"  (1.803 m)   Wt (!) 252 lb 9.6 oz (114.6 kg)   BMI 35.23 kg/m   Constitutional:  Well nourished. Alert and oriented, No acute distress. HEENT: [place] AT, moist mucus membranes.  Trachea midline Cardiovascular: No clubbing, cyanosis, or edema. Respiratory: Normal respiratory effort, no increased work of breathing. GI: Abdomen is soft, non tender, non distended, no abdominal masses. Liver and spleen not palpable.  No hernias appreciated.  Stool sample for occult testing is not indicated.   GU: No CVA tenderness.  No bladder fullness or masses.  Patient with circumcised phallus. Urethral meatus is patent.  No penile discharge. No penile lesions or rashes. Scrotum without lesions, cysts, rashes and/or edema.  Testicles are located scrotally bilaterally. No masses are appreciated in the testicles. Left and right epididymis are normal. Rectal: Patient with  normal sphincter tone. Anus and perineum without scarring or rashes.  No rectal masses are appreciated. Prostate is approximately 50 grams, could only palpate the apex and midportion of the gland, no nodules are appreciated. Seminal vesicles could not be palpated.  Skin: No rashes, bruises or suspicious lesions. Lymph: No inguinal adenopathy. Neurologic: Grossly intact, no focal deficits, moving all 4 extremities. Psychiatric: Normal mood and affect.   Laboratory Data: PSA History  0.66 ng/mL on 06/13/2014  0.90 ng/mL on 06/14/2015  0.77 ng/mL on 06/13/2016  0.70 ng/mL on 09/06/2017  0.90 ng/mL on 01/2019 Results for orders placed or performed in visit on 01/28/20  Microscopic Examination   Urine  Result Value Ref Range   WBC, UA 0-5 0 - 5 /hpf   RBC 0-2 0 - 2 /hpf   Epithelial Cells (non renal) 0-10 0 - 10 /hpf   Casts Present (A) None seen /lpf   Cast Type Hyaline casts N/A   Mucus, UA Present (A) Not Estab.   Bacteria, UA None seen None seen/Few  Urinalysis, Complete  Result Value Ref Range   Specific Gravity, UA 1.010 1.005 - 1.030   pH, UA 5.5 5.0 - 7.5   Color, UA Yellow Yellow   Appearance Ur Clear Clear   Leukocytes,UA Negative Negative   Protein,UA Negative Negative/Trace   Glucose, UA Negative Negative   Ketones, UA 1+ (A) Negative   RBC, UA Negative Negative   Bilirubin, UA Negative Negative   Urobilinogen, Ur 0.2 0.2 - 1.0 mg/dL   Nitrite, UA Negative Negative   Microscopic Examination See below:    I have reviewed the labs.  Assessment & Plan:    1. Erectile dysfunction SHIM score is 12, it is worsened He would like to try different PDE 5 inhibitor- script for  sildenafil 100 mg, 1 tablets two hours prior to intercourse on an empty stomach, # 30 RTC in 12 months for repeat SHIM score and exam   2. BPH with LUTS IPSS score is 11/1, it is worsened Continue conservative management, avoiding bladder irritants and timed voiding's Continue Cialis 5 mg daily as prescribed by his PCP RTC in 12 months for I PSS and exam   3.  Abnormal colored urine I explained to him that the brown color urine may represent dehydration or blood in the urine.  I recommended that even though his UA is negative for micro heme today, that he undergo a renal ultrasound to evaluate for malignancy.  I explained that my reasoning for this is that if the discoloration of his urine is due to microscopic hematuria this is a possible symptom of kidney cancer.  He deferred the ultrasound at this time citing cost.  I then asked if he would collect his urine during the time when he notes it is discolored so that he may bring the sample and for a microscopic exam to check for micro heme.  He is agreeable to this and I have given him a collection cup.   Return in about 1 year (around 01/27/2021) for UA, PSA, I PSS, SHIM and exam.  These notes generated with voice recognition software. I apologize for typographical errors.  Zara Council, PA-C  [facility] [address] [place], [place] [address] [phone]

## 2020-01-28 ENCOUNTER — Encounter: Payer: Self-pay | Admitting: Urology

## 2020-01-28 ENCOUNTER — Other Ambulatory Visit: Payer: Self-pay

## 2020-01-28 ENCOUNTER — Ambulatory Visit (INDEPENDENT_AMBULATORY_CARE_PROVIDER_SITE_OTHER): Payer: 59 | Admitting: Urology

## 2020-01-28 VITALS — BP 143/87 | HR 72 | Ht 71.0 in | Wt 252.6 lb

## 2020-01-28 DIAGNOSIS — R3989 Other symptoms and signs involving the genitourinary system: Secondary | ICD-10-CM

## 2020-01-28 DIAGNOSIS — N529 Male erectile dysfunction, unspecified: Secondary | ICD-10-CM | POA: Diagnosis not present

## 2020-01-28 DIAGNOSIS — N138 Other obstructive and reflux uropathy: Secondary | ICD-10-CM

## 2020-01-28 DIAGNOSIS — N401 Enlarged prostate with lower urinary tract symptoms: Secondary | ICD-10-CM

## 2020-01-29 LAB — URINALYSIS, COMPLETE
Bilirubin, UA: NEGATIVE
Glucose, UA: NEGATIVE
Leukocytes,UA: NEGATIVE
Nitrite, UA: NEGATIVE
Protein,UA: NEGATIVE
RBC, UA: NEGATIVE
Specific Gravity, UA: 1.01 (ref 1.005–1.030)
Urobilinogen, Ur: 0.2 mg/dL (ref 0.2–1.0)
pH, UA: 5.5 (ref 5.0–7.5)

## 2020-01-29 LAB — MICROSCOPIC EXAMINATION: Bacteria, UA: NONE SEEN

## 2020-02-06 ENCOUNTER — Other Ambulatory Visit: Payer: Self-pay

## 2020-02-06 MED ORDER — SILDENAFIL CITRATE 100 MG PO TABS
100.0000 mg | ORAL_TABLET | Freq: Every day | ORAL | 6 refills | Status: DC | PRN
Start: 2020-02-06 — End: 2021-02-03

## 2020-02-12 ENCOUNTER — Ambulatory Visit: Payer: 59

## 2020-02-19 ENCOUNTER — Ambulatory Visit: Payer: 59

## 2020-02-19 ENCOUNTER — Other Ambulatory Visit: Payer: Self-pay

## 2020-02-19 DIAGNOSIS — Z7901 Long term (current) use of anticoagulants: Secondary | ICD-10-CM | POA: Diagnosis not present

## 2020-02-19 DIAGNOSIS — Z86711 Personal history of pulmonary embolism: Secondary | ICD-10-CM

## 2020-02-19 LAB — POCT INR: INR: 2.5 (ref 2.0–3.0)

## 2020-02-19 NOTE — Patient Instructions (Addendum)
Pre visit review using our clinic review tool, if applicable. No additional management support is needed unless otherwise documented below in the visit note.  Continue 3mg  daily except take 1.5mg  on Saturday. Recheck in 5 wks.

## 2020-02-21 NOTE — Progress Notes (Signed)
Agree. Thanks

## 2020-03-02 ENCOUNTER — Encounter: Payer: Self-pay | Admitting: Family Medicine

## 2020-03-02 ENCOUNTER — Ambulatory Visit (INDEPENDENT_AMBULATORY_CARE_PROVIDER_SITE_OTHER): Payer: 59 | Admitting: Family Medicine

## 2020-03-02 ENCOUNTER — Other Ambulatory Visit: Payer: Self-pay

## 2020-03-02 DIAGNOSIS — H6120 Impacted cerumen, unspecified ear: Secondary | ICD-10-CM

## 2020-03-02 NOTE — Progress Notes (Signed)
This visit occurred during the SARS-CoV-2 public health emergency.  Safety protocols were in place, including screening questions prior to the visit, additional usage of staff PPE, and extensive cleaning of exam room while observing appropriate contact time as indicated for disinfecting solutions.  R ear feels plugged.  Had been swimming recently.  Had used a q tip, then tried ear wax drops.  Not painful but pressure.  No L sided sx.  No FCNAVD.    Meds, vitals, and allergies reviewed.   ROS: Per HPI unless specifically indicated in ROS section   nad ncat No occluding cerumen in the left canal. He does have cerumen in the right canal but this is easily removed with irrigation without complication. We were testing is louder on the right versus the left but he does have tympanic membrane movement with Valsalva.  Air greater than bone conduction on the bilateral ears.

## 2020-03-02 NOTE — Patient Instructions (Addendum)
Update me as needed.   Use debrox if needed.  Then irrigate 5-10 minutes later.  Take care.  Glad to see you.

## 2020-03-03 DIAGNOSIS — H6120 Impacted cerumen, unspecified ear: Secondary | ICD-10-CM | POA: Insufficient documentation

## 2020-03-03 NOTE — Assessment & Plan Note (Signed)
Resolved with irrigation.  Recheck with tympanic membrane movement on Valsalva.  Air greater than bone conduction bilaterally.  He can update me as needed.  Routine cautions given to patient.

## 2020-03-20 ENCOUNTER — Other Ambulatory Visit: Payer: Self-pay | Admitting: Family Medicine

## 2020-03-20 ENCOUNTER — Other Ambulatory Visit: Payer: Self-pay | Admitting: Urology

## 2020-03-20 DIAGNOSIS — Z7901 Long term (current) use of anticoagulants: Secondary | ICD-10-CM

## 2020-03-20 DIAGNOSIS — N401 Enlarged prostate with lower urinary tract symptoms: Secondary | ICD-10-CM

## 2020-03-22 ENCOUNTER — Other Ambulatory Visit: Payer: Self-pay | Admitting: Urology

## 2020-03-22 DIAGNOSIS — N138 Other obstructive and reflux uropathy: Secondary | ICD-10-CM

## 2020-03-22 NOTE — Telephone Encounter (Signed)
Pt compliant with coumadin management. Sent in script 

## 2020-03-25 ENCOUNTER — Ambulatory Visit: Payer: 59

## 2020-03-25 ENCOUNTER — Other Ambulatory Visit: Payer: Self-pay

## 2020-03-25 DIAGNOSIS — Z86711 Personal history of pulmonary embolism: Secondary | ICD-10-CM

## 2020-03-25 DIAGNOSIS — Z7901 Long term (current) use of anticoagulants: Secondary | ICD-10-CM

## 2020-03-25 LAB — POCT INR: INR: 2.4 (ref 2.0–3.0)

## 2020-03-25 NOTE — Patient Instructions (Signed)
Pre visit review using our clinic review tool, if applicable. No additional management support is needed unless otherwise documented below in the visit note.  INR today 2.4.  patient had to leave urgently and couldn't stay for dosing instructions. Keep same and recheck in 5 weeks. Will call back this afternoon to discuss and set up recheck appt.   Pt contacted and advised no changes in dosing. Recheck in 6 wks. Pt verbalized understanding and did not want AVS mailed. Advised if anything changed to contact office.

## 2020-03-26 NOTE — Progress Notes (Signed)
Agree. Thanks

## 2020-05-06 ENCOUNTER — Other Ambulatory Visit: Payer: Self-pay

## 2020-05-06 ENCOUNTER — Ambulatory Visit: Payer: 59

## 2020-05-06 DIAGNOSIS — Z7901 Long term (current) use of anticoagulants: Secondary | ICD-10-CM

## 2020-05-06 DIAGNOSIS — Z86711 Personal history of pulmonary embolism: Secondary | ICD-10-CM

## 2020-05-06 LAB — POCT INR: INR: 2.7 (ref 2.0–3.0)

## 2020-05-06 NOTE — Patient Instructions (Addendum)
Pre visit review using our clinic review tool, if applicable. No additional management support is needed unless otherwise documented below in the visit note.   Continue with current regimen of 3 mg daily except for 1.5 mg on Saturday. Re-check in 6 weeks.

## 2020-05-09 NOTE — Progress Notes (Signed)
Agree. Thanks

## 2020-06-17 ENCOUNTER — Other Ambulatory Visit: Payer: Self-pay

## 2020-06-17 ENCOUNTER — Ambulatory Visit: Payer: 59

## 2020-06-17 DIAGNOSIS — Z7901 Long term (current) use of anticoagulants: Secondary | ICD-10-CM

## 2020-06-17 LAB — POCT INR: INR: 1.9 — AB (ref 2.0–3.0)

## 2020-06-17 NOTE — Patient Instructions (Signed)
Pre visit review using our clinic review tool, if applicable. No additional management support is needed unless otherwise documented below in the visit note.  Increase dose today to 6mg  then continue with current regimen of 3 mg daily except for 1.5 mg on Saturday. Per pt request re-check in 6 weeks.

## 2020-06-19 NOTE — Progress Notes (Signed)
Agree. Thanks

## 2020-07-04 ENCOUNTER — Other Ambulatory Visit: Payer: Self-pay | Admitting: Family Medicine

## 2020-07-04 DIAGNOSIS — Z7901 Long term (current) use of anticoagulants: Secondary | ICD-10-CM

## 2020-07-06 ENCOUNTER — Telehealth: Payer: Self-pay

## 2020-07-06 NOTE — Telephone Encounter (Signed)
Durenda Age pharmacist at [facility] in [place] left v/m requesting to change pt from warfarin to [place] (branded generic of warfarin per Durenda Age) which is [facility] pharmacies preferred brand of warfarin. Durenda Age needs approval to switch brands and to suggest to monitor next few INR if pt is changed to [place]. Sending note to [employer].

## 2020-07-07 NOTE — Telephone Encounter (Signed)
Contacted pt and advised his dosing may need to be adjusted due to change in manufacturer. Advised he should come in about 3 wks from now. Pt agreed and is RS for 3 wks. Pt will call if any problems with new warfarin.   Contacted [facility] and advised change in brand of warfarin is authorized. Durenda Age verbalized understanding.

## 2020-07-29 ENCOUNTER — Ambulatory Visit: Payer: 59

## 2020-08-05 ENCOUNTER — Ambulatory Visit: Payer: 59

## 2020-08-05 ENCOUNTER — Other Ambulatory Visit: Payer: Self-pay

## 2020-08-05 DIAGNOSIS — Z7901 Long term (current) use of anticoagulants: Secondary | ICD-10-CM

## 2020-08-05 LAB — POCT INR: INR: 2.2 (ref 2.0–3.0)

## 2020-08-05 NOTE — Patient Instructions (Addendum)
Pre visit review using our clinic review tool, if applicable. No additional management support is needed unless otherwise documented below in the visit note.  Continue with current regimen of 3 mg daily except for 1.5 mg on Saturday. Per pt request re-check in 4 weeks.

## 2020-08-08 NOTE — Progress Notes (Signed)
Agree. Thanks

## 2020-09-02 ENCOUNTER — Ambulatory Visit: Payer: 59

## 2020-09-02 ENCOUNTER — Encounter: Payer: Self-pay | Admitting: Family Medicine

## 2020-09-02 ENCOUNTER — Other Ambulatory Visit: Payer: Self-pay

## 2020-09-02 DIAGNOSIS — Z7901 Long term (current) use of anticoagulants: Secondary | ICD-10-CM

## 2020-09-02 LAB — POCT INR: INR: 4.4 — AB (ref 2.0–3.0)

## 2020-09-02 NOTE — Patient Instructions (Addendum)
Pre visit review using our clinic review tool, if applicable. No additional management support is needed unless otherwise documented below in the visit note.  HOLD today and tomorrows dose and then change your weekly dose to take 1 tablet everyday EXCEPT take 0.5 tablet on Monday, Wednesday, and Friday.

## 2020-09-04 NOTE — Progress Notes (Signed)
Agree. Thanks

## 2020-09-14 ENCOUNTER — Other Ambulatory Visit: Payer: Self-pay

## 2020-09-14 ENCOUNTER — Ambulatory Visit: Payer: 59

## 2020-09-14 DIAGNOSIS — Z86711 Personal history of pulmonary embolism: Secondary | ICD-10-CM

## 2020-09-14 DIAGNOSIS — Z7901 Long term (current) use of anticoagulants: Secondary | ICD-10-CM | POA: Diagnosis not present

## 2020-09-14 LAB — POCT INR: INR: 1.7 — AB (ref 2.0–3.0)

## 2020-09-14 NOTE — Patient Instructions (Addendum)
Pre visit review using our clinic review tool, if applicable. No additional management support is needed unless otherwise documented below in the visit note.  Increase dose today to 6 mg and then continue taking 1 tablet everyday EXCEPT take 0.5 tablet on Monday, Wednesday, and Friday. Recheck in 4 wks.

## 2020-09-15 NOTE — Progress Notes (Signed)
Agree. Thanks

## 2020-10-12 ENCOUNTER — Telehealth: Payer: Self-pay

## 2020-10-12 ENCOUNTER — Ambulatory Visit: Payer: 59

## 2020-10-12 DIAGNOSIS — Z7901 Long term (current) use of anticoagulants: Secondary | ICD-10-CM

## 2020-10-12 NOTE — Telephone Encounter (Signed)
Refill request for Warfarin 3 mg tablets  LOV - 03/02/20 NOV - not scheduled  Last refilled - 07/05/20 #90/0

## 2020-10-13 NOTE — Telephone Encounter (Signed)
Patient scheduled for coumadin clinic tomorrow 4/14 and will refill prescription at this visit.

## 2020-10-14 ENCOUNTER — Other Ambulatory Visit: Payer: Self-pay

## 2020-10-14 ENCOUNTER — Ambulatory Visit: Payer: 59

## 2020-10-14 DIAGNOSIS — Z7901 Long term (current) use of anticoagulants: Secondary | ICD-10-CM | POA: Diagnosis not present

## 2020-10-14 DIAGNOSIS — Z86711 Personal history of pulmonary embolism: Secondary | ICD-10-CM

## 2020-10-14 LAB — POCT INR: INR: 2.5 (ref 2.0–3.0)

## 2020-10-14 MED ORDER — WARFARIN SODIUM 3 MG PO TABS
ORAL_TABLET | ORAL | 0 refills | Status: DC
Start: 2020-10-14 — End: 2021-01-20

## 2020-10-14 NOTE — Patient Instructions (Addendum)
Pre visit review using our clinic review tool, if applicable. No additional management support is needed unless otherwise documented below in the visit note.  Continue taking 1 tablet everyday EXCEPT take 0.5 tablet on Monday, Wednesday, and Friday. Recheck in 5 wks.

## 2020-10-14 NOTE — Telephone Encounter (Signed)
Pharmacy requests refill on: Warfarin 3 mg  LAST REFILL: 07/05/2020 (Q-90, R-0) LAST OV: 10/14/2020 NEXT OV: 11/16/2020 PHARMACY: [place], [place]

## 2020-10-14 NOTE — Addendum Note (Signed)
Addended by: Ronna Polio on: 10/14/2020 01:02 PM   Modules accepted: Orders

## 2020-10-15 NOTE — Progress Notes (Signed)
Agree. Thanks

## 2020-11-16 ENCOUNTER — Ambulatory Visit: Payer: 59

## 2020-11-18 ENCOUNTER — Ambulatory Visit: Payer: 59

## 2020-11-23 ENCOUNTER — Ambulatory Visit: Payer: 59

## 2020-11-23 ENCOUNTER — Other Ambulatory Visit: Payer: Self-pay

## 2020-11-23 DIAGNOSIS — Z86711 Personal history of pulmonary embolism: Secondary | ICD-10-CM

## 2020-11-23 DIAGNOSIS — Z7901 Long term (current) use of anticoagulants: Secondary | ICD-10-CM

## 2020-11-23 LAB — POCT INR: INR: 1.9 — AB (ref 2.0–3.0)

## 2020-11-23 NOTE — Patient Instructions (Addendum)
Pre visit review using our clinic review tool, if applicable. No additional management support is needed unless otherwise documented below in the visit note.  Increase dose today to 6mg  then continue taking 1 tablet everyday EXCEPT take 0.5 tablet on Monday, Wednesday, and Friday. Recheck in 5 wks ,

## 2020-11-24 NOTE — Progress Notes (Signed)
Agree. Thanks

## 2020-11-25 ENCOUNTER — Ambulatory Visit: Payer: 59

## 2020-12-28 ENCOUNTER — Ambulatory Visit (INDEPENDENT_AMBULATORY_CARE_PROVIDER_SITE_OTHER): Payer: 59

## 2020-12-28 ENCOUNTER — Other Ambulatory Visit: Payer: Self-pay

## 2020-12-28 DIAGNOSIS — Z86711 Personal history of pulmonary embolism: Secondary | ICD-10-CM

## 2020-12-28 DIAGNOSIS — Z7901 Long term (current) use of anticoagulants: Secondary | ICD-10-CM | POA: Diagnosis not present

## 2020-12-28 LAB — POCT INR: INR: 2 (ref 2.0–3.0)

## 2020-12-28 NOTE — Patient Instructions (Addendum)
Pre visit review using our clinic review tool, if applicable. No additional management support is needed unless otherwise documented below in the visit note.  continue taking 1 tablet everyday EXCEPT take 0.5 tablet on Monday, Wednesday, and Friday.

## 2020-12-29 NOTE — Progress Notes (Signed)
Agree. Thanks

## 2021-01-20 ENCOUNTER — Other Ambulatory Visit: Payer: Self-pay | Admitting: Family Medicine

## 2021-01-20 DIAGNOSIS — Z7901 Long term (current) use of anticoagulants: Secondary | ICD-10-CM

## 2021-01-20 NOTE — Telephone Encounter (Signed)
Erx request for coumadin 3 mg tablets.

## 2021-01-20 NOTE — Telephone Encounter (Signed)
Pt is compliant with coumadin management. Sent in prescription

## 2021-01-21 ENCOUNTER — Other Ambulatory Visit: Payer: Self-pay

## 2021-01-23 ENCOUNTER — Other Ambulatory Visit: Payer: Self-pay | Admitting: Family Medicine

## 2021-01-23 DIAGNOSIS — E7800 Pure hypercholesterolemia, unspecified: Secondary | ICD-10-CM

## 2021-01-25 ENCOUNTER — Other Ambulatory Visit: Payer: Self-pay

## 2021-01-25 ENCOUNTER — Ambulatory Visit (INDEPENDENT_AMBULATORY_CARE_PROVIDER_SITE_OTHER): Payer: 59

## 2021-01-25 ENCOUNTER — Other Ambulatory Visit (INDEPENDENT_AMBULATORY_CARE_PROVIDER_SITE_OTHER): Payer: 59

## 2021-01-25 DIAGNOSIS — E7800 Pure hypercholesterolemia, unspecified: Secondary | ICD-10-CM | POA: Diagnosis not present

## 2021-01-25 DIAGNOSIS — Z7901 Long term (current) use of anticoagulants: Secondary | ICD-10-CM | POA: Diagnosis not present

## 2021-01-25 DIAGNOSIS — Z86711 Personal history of pulmonary embolism: Secondary | ICD-10-CM

## 2021-01-25 LAB — COMPREHENSIVE METABOLIC PANEL
ALT: 29 U/L (ref 0–53)
AST: 20 U/L (ref 0–37)
Albumin: 3.8 g/dL (ref 3.5–5.2)
Alkaline Phosphatase: 43 U/L (ref 39–117)
BUN: 16 mg/dL (ref 6–23)
CO2: 28 mEq/L (ref 19–32)
Calcium: 8.9 mg/dL (ref 8.4–10.5)
Chloride: 105 mEq/L (ref 96–112)
Creatinine, Ser: 1.08 mg/dL (ref 0.40–1.50)
GFR: 76.25 mL/min (ref >60.00)
Glucose, Bld: 100 mg/dL — ABNORMAL HIGH (ref 70–99)
Potassium: 3.9 mEq/L (ref 3.5–5.1)
Sodium: 140 mEq/L (ref 135–145)
Total Bilirubin: 0.5 mg/dL (ref 0.2–1.2)
Total Protein: 6.5 g/dL (ref 6.0–8.3)

## 2021-01-25 LAB — CBC WITH DIFFERENTIAL/PLATELET
Basophils Absolute: 0 10*3/uL (ref 0.0–0.1)
Basophils Relative: 1.1 % (ref 0.0–3.0)
Eosinophils Absolute: 0.4 10*3/uL (ref 0.0–0.7)
Eosinophils Relative: 8.4 % — ABNORMAL HIGH (ref 0.0–5.0)
HCT: 44.8 % (ref 39.0–52.0)
Hemoglobin: 15.2 g/dL (ref 13.0–17.0)
Lymphocytes Relative: 23.2 % (ref 12.0–46.0)
Lymphs Abs: 1 10*3/uL (ref 0.7–4.0)
MCHC: 34 g/dL (ref 30.0–36.0)
MCV: 88.2 fl (ref 78.0–100.0)
Monocytes Absolute: 0.4 10*3/uL (ref 0.1–1.0)
Monocytes Relative: 8.6 % (ref 3.0–12.0)
Neutro Abs: 2.6 10*3/uL (ref 1.4–7.7)
Neutrophils Relative %: 58.7 % (ref 43.0–77.0)
Platelets: 193 10*3/uL (ref 150.0–400.0)
RBC: 5.08 Mil/uL (ref 4.22–5.81)
RDW: 12.8 % (ref 11.5–15.5)
WBC: 4.4 10*3/uL (ref 4.0–10.5)

## 2021-01-25 LAB — LIPID PANEL
Cholesterol: 179 mg/dL (ref 0–200)
HDL: 45.8 mg/dL (ref >39.00)
LDL Cholesterol: 102 mg/dL — ABNORMAL HIGH (ref 0–99)
NonHDL: 133.22
Total CHOL/HDL Ratio: 4
Triglycerides: 158 mg/dL — ABNORMAL HIGH (ref 0.0–149.0)
VLDL: 31.6 mg/dL (ref 0.0–40.0)

## 2021-01-25 LAB — POCT INR: INR: 1.8 — AB (ref 2.0–3.0)

## 2021-01-25 NOTE — Patient Instructions (Addendum)
Pre visit review using our clinic review tool, if applicable. No additional management support is needed unless otherwise documented below in the visit note.  Increase dose today to take  '6mg'$  today and then continue taking 1 tablet everyday EXCEPT take 0.5 tablet on Monday, Wednesday, and Friday. Recheck in 4 wks.

## 2021-01-26 ENCOUNTER — Telehealth: Payer: Self-pay

## 2021-01-26 NOTE — Progress Notes (Signed)
Agree. Thanks

## 2021-01-26 NOTE — Telephone Encounter (Signed)
New message    1. Which medications need to be refilled? (please list name of each medication and dose if known) valACYclovir (VALTREX) 1000 MG tablet  2. Which pharmacy/location (including street and city if local pharmacy) is medication to be sent to?[place], [place]

## 2021-01-27 ENCOUNTER — Ambulatory Visit: Payer: Self-pay | Admitting: Urology

## 2021-01-27 DIAGNOSIS — N138 Other obstructive and reflux uropathy: Secondary | ICD-10-CM

## 2021-01-27 DIAGNOSIS — N529 Male erectile dysfunction, unspecified: Secondary | ICD-10-CM

## 2021-01-27 MED ORDER — VALACYCLOVIR HCL 1 G PO TABS: ORAL_TABLET | ORAL | 11 refills | Status: AC

## 2021-01-27 NOTE — Telephone Encounter (Signed)
Left message on VM that rx was sent.

## 2021-01-28 ENCOUNTER — Encounter: Payer: Self-pay | Admitting: Urology

## 2021-02-03 ENCOUNTER — Other Ambulatory Visit: Payer: Self-pay

## 2021-02-03 ENCOUNTER — Encounter: Payer: Self-pay | Admitting: Family Medicine

## 2021-02-03 ENCOUNTER — Ambulatory Visit (INDEPENDENT_AMBULATORY_CARE_PROVIDER_SITE_OTHER): Payer: 59 | Admitting: Family Medicine

## 2021-02-03 VITALS — BP 138/90 | HR 89 | Temp 97.4°F | Ht 71.0 in | Wt 270.0 lb

## 2021-02-03 DIAGNOSIS — B001 Herpesviral vesicular dermatitis: Secondary | ICD-10-CM

## 2021-02-03 DIAGNOSIS — Z86711 Personal history of pulmonary embolism: Secondary | ICD-10-CM

## 2021-02-03 DIAGNOSIS — Z0000 Encounter for general adult medical examination without abnormal findings: Secondary | ICD-10-CM | POA: Diagnosis not present

## 2021-02-03 DIAGNOSIS — Z7189 Other specified counseling: Secondary | ICD-10-CM

## 2021-02-03 DIAGNOSIS — E7800 Pure hypercholesterolemia, unspecified: Secondary | ICD-10-CM

## 2021-02-03 DIAGNOSIS — N529 Male erectile dysfunction, unspecified: Secondary | ICD-10-CM

## 2021-02-03 NOTE — Progress Notes (Signed)
This visit occurred during the SARS-CoV-2 public health emergency.  Safety protocols were in place, including screening questions prior to the visit, additional usage of staff PPE, and extensive cleaning of exam room while observing appropriate contact time as indicated for disinfecting solutions.  CPE- See plan.  Routine anticipatory guidance given to patient.  See health maintenance.  The possibility exists that previously documented standard health maintenance information may have been brought forward from a previous encounter into this note.  If needed, that same information has been updated to reflect the current situation based on today's encounter.    Tetanus 2013 Flu yearly PNA d/w pt.   shingrix done.    Covid vaccine Living will d/w pt. Wife designated if patient were incapacitated. Colonoscopy 2019 PSA d/w pt.  See avs.   Diet and exercise d/w pt.   H/o blood donation at [place] with HIV screening that would have been done then.   He was asking about hearing eval- air>bone conduction B and weber equal on exam.  Whisper intact and TM wnl and no ETD on exam.  D/w pt about observation for now.  He agrees.    Labs d/w pt.  Lipids improved.  Low sugar diet d/w pt.  Handout given to patient.   ED per urology.  Taking cialis at baseline.    Still anticoagulated.  No bleeding.  D/w pt about hx with DVT.  No CP, no leg swelling.    Valtrex prn, no ADE on med.  Used prn.    PMH and SH reviewed  Meds, vitals, and allergies reviewed.   ROS: Per HPI.  Unless specifically indicated otherwise in HPI, the patient denies:  General: fever. Eyes: acute vision changes ENT: sore throat Cardiovascular: chest pain Respiratory: SOB GI: vomiting GU: dysuria Musculoskeletal: acute back pain Derm: acute rash Neuro: acute motor dysfunction Psych: worsening mood Endocrine: polydipsia Heme: bleeding Allergy: hayfever  GEN: nad, alert and oriented HEENT: ncat NECK: supple w/o  LA CV: rrr. PULM: ctab, no inc wob ABD: soft, +bs EXT: no edema SKIN: no acute rash

## 2021-02-03 NOTE — Patient Instructions (Addendum)
Ask urology about getting your PSA done.  Take care.  Glad to see you. I would get a flu shot each fall.

## 2021-02-06 NOTE — Assessment & Plan Note (Signed)
Valtrex prn, no ADE on med.  Used prn.  Continue prn use.

## 2021-02-06 NOTE — Assessment & Plan Note (Signed)
Per u rology 

## 2021-02-06 NOTE — Assessment & Plan Note (Signed)
Labs d/w pt.  Lipids improved.  Low sugar diet d/w pt.  Handout given to patient.

## 2021-02-06 NOTE — Assessment & Plan Note (Signed)
Living will d/w pt.  Wife designated if patient were incapacitated.   ?

## 2021-02-06 NOTE — Assessment & Plan Note (Signed)
Tetanus 2013 Flu yearly PNA d/w pt.   shingrix done.    Covid vaccine Living will d/w pt. Wife designated if patient were incapacitated. Colonoscopy 2019 PSA d/w pt.  See avs.   Diet and exercise d/w pt.   H/o blood donation at [place] with HIV screening that would have been done then.   He was asking about hearing eval- air>bone conduction B and weber equal on exam.  Whisper intact and TM wnl and no ETD on exam.  D/w pt about observation for now.  He agrees.

## 2021-02-06 NOTE — Assessment & Plan Note (Signed)
Still anticoagulated.  No bleeding.  D/w pt about hx with DVT.  No CP, no leg swelling.  Continue coumadin.

## 2021-02-23 ENCOUNTER — Telehealth: Payer: Self-pay

## 2021-02-23 NOTE — Telephone Encounter (Signed)
Pt had apt for coumadin clinic for tomorrow but nurse will not be in the office tomorrow for clinic. Pt cannot make apt for 8/26 for coumadin clinic but can make it for a quick lab. He will come in for POCT INR and that result will be sent to this nurse and this nurse will f/u with the pt with dosing instructions.  Changed apt from coumadin clinic apt to lab apt.

## 2021-02-24 ENCOUNTER — Ambulatory Visit: Payer: 59

## 2021-02-25 ENCOUNTER — Other Ambulatory Visit: Payer: Self-pay

## 2021-02-25 ENCOUNTER — Other Ambulatory Visit: Payer: 59

## 2021-02-25 ENCOUNTER — Ambulatory Visit (INDEPENDENT_AMBULATORY_CARE_PROVIDER_SITE_OTHER): Payer: 59

## 2021-02-25 DIAGNOSIS — Z7901 Long term (current) use of anticoagulants: Secondary | ICD-10-CM

## 2021-02-25 LAB — POCT INR: INR: 2.1 (ref 2.0–3.0)

## 2021-02-25 NOTE — Patient Instructions (Signed)
Pre visit review using our clinic review tool, if applicable. No additional management support is needed unless otherwise documented below in the visit note. 

## 2021-03-30 ENCOUNTER — Telehealth: Payer: Self-pay

## 2021-03-30 NOTE — Telephone Encounter (Signed)
Pt is on the lab schedule for 10/6 to have point of care INR test. Pt needs to be made aware that [facility] has been closed fo renovations and he will have to go to [facility] and have a coumadin clinic apt and not a lab apt. Lab does not have the capability to do the test at [facility].    LMV for pt to call  Please route call to this nurse if pt calles back.

## 2021-04-05 ENCOUNTER — Other Ambulatory Visit: Payer: Self-pay

## 2021-04-05 ENCOUNTER — Telehealth (INDEPENDENT_AMBULATORY_CARE_PROVIDER_SITE_OTHER): Payer: 59 | Admitting: Family Medicine

## 2021-04-05 ENCOUNTER — Encounter: Payer: Self-pay | Admitting: Family Medicine

## 2021-04-05 DIAGNOSIS — U071 COVID-19: Secondary | ICD-10-CM | POA: Insufficient documentation

## 2021-04-05 MED ORDER — MOLNUPIRAVIR EUA 200MG CAPSULE: 4.0000 | ORAL_CAPSULE | Freq: Two times a day (BID) | ORAL | 0 refills | Status: AC

## 2021-04-05 NOTE — Patient Instructions (Signed)
Take the molnupiravir (anti viral) for covid 19  Drink fluids and rest  Mucinex DM is good for cough and congestion  Nasal saline spray helps with nasal stuffiness  Tylenol for pain and fever as directed  Update if not starting to improve in a week or if worsening   If worse or new symptoms let us know  If chest tightness/wheezing let us know  If short of breath-go to ER if severe

## 2021-04-05 NOTE — Assessment & Plan Note (Signed)
Day 2 with fever and uri symptoms  Px molnupiravir (risk factor includes age and h/o PE in the past) Moderate symptoms Disc symptom care with tylenol and mucinex DM and nasal saline Rest and fluids ER precautions discussed Update if not starting to improve in a week or if worsening  Update if any wheezing or chest tightness

## 2021-04-05 NOTE — Progress Notes (Signed)
Virtual Visit via Video Note  I connected with Ronald Hatfield on 04/05/21 at 11:30 AM EDT by a video enabled telemedicine application and verified that I am speaking with the correct person using two identifiers.  Location: Patient: home Provider: office   I discussed the limitations of evaluation and management by telemedicine and the availability of in person appointments. The patient expressed understanding and agreed to proceed.  Parties involved in encounter  Patient: Ronald Hatfield  Provider:  Loura Pardon MD   History of Present Illness: Pt presents with a positive covid test   Started a headache yesterday afternoon Also fever -hot and cold all night/did not sleep Was exposed to covid  Home test positive last night   Cough- phlegm in throat  Thinks the mucous is from throat instead of lungs  Mucous is clear  Sob with cough  Joint pain in hands/elbows-improved A little light headed   Throat is sore -esp after coughing fit  Nasal congestion  No ear pain /just stopped up    Otc: mucinex Did not take tylenol but has it   Has celebrex for bone spurs    Takes coumadin-PE in the past   Is immunized for covid with 2 boosters  Patient Active Problem List   Diagnosis Date Noted   COVID-19 04/05/2021   GERD (gastroesophageal reflux disease) 05/28/2019   Advance care planning 06/24/2018   Hyperprolactinemia ([place]) 11/05/2017   Pain in joint, ankle and foot 06/16/2016   Protein C deficiency ([place]) 05/10/2014   Encounter for therapeutic drug monitoring 08/21/2013   Long term (current) use of anticoagulants 08/12/2012   Fever blister 04/23/2011   Personal history of PE (pulmonary embolism) 04/23/2011   Routine general medical examination at a health care facility 04/23/2011   Overweight 09/09/2009   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   Attention deficit disorder 03/23/2008   HYPERCHOLESTEROLEMIA 08/07/2007   HYPERGLYCEMIA 08/07/2007   Past Medical History:  Diagnosis  Date   Attention deficit disorder without mention of hyperactivity    Depressive disorder, not elsewhere classified    Dermatophytosis of the body    Encounter for therapeutic drug monitoring    History of DVT (deep vein thrombosis)    Impotence of organic origin    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 Council Mechanic   Social History   Tobacco Use   Smoking status: Never   Smokeless tobacco: Former    Types: Nurse, children's Use: Never used  Substance Use Topics   Alcohol use: Yes    Comment: occasional   Drug use: No   Family History  Problem Relation Age of Onset   Alcohol abuse Father    Deep vein thrombosis Father        h/o phlebitis   Deep vein thrombosis Mother    Skin cancer Mother    Lung cancer Mother  Deep vein thrombosis Sister    Alzheimer's disease Paternal Grandmother    Alcohol abuse Maternal Grandfather    Diabetes Maternal Grandmother    Coronary artery disease Maternal Grandmother    Stroke Maternal Grandmother    Prostate cancer Neg Hx    Colon cancer Neg Hx    Other Neg Hx        pituitary dz   No Known Allergies Current Outpatient Medications on File Prior to Visit  Medication Sig Dispense Refill   celecoxib (CELEBREX) 200 MG capsule TAKE ONE CAPSULE BY MOUTH ONE TIME DAILY WITH FOOD 90 capsule 0   cetirizine (ZYRTEC) 10 MG tablet Take 10 mg by mouth as needed for  allergies.     tadalafil (CIALIS) 20 MG tablet Take 1 tablet (20 mg total) by mouth daily as needed for erectile dysfunction. 30 tablet 11   tadalafil (CIALIS) 5 MG tablet TAKE ONE TABLET BY MOUTH DAILY 30 tablet 11   valACYclovir (VALTREX) 1000 MG tablet TAKE TWO TABLETS BY MOUTH TWICE A DAY AS NEEDED (FOR ONE DAY) 20 tablet 11   warfarin (JANTOVEN) 3 MG tablet TAKE ONE TABLET BY MOUTH ONE TIME DAILY EXCEPT 1/2 TABLET ON MON, WED AND FRI OR AS INSTRUCTED BY COUMADIN CLINIC 90 tablet 0   No current facility-administered medications on file prior to visit.   Review of Systems  Constitutional:  Positive for chills, fever and malaise/fatigue.  HENT:  Positive for congestion and sore throat. Negative for ear pain and sinus pain.   Eyes:  Negative for blurred vision, discharge and redness.  Respiratory:  Positive for cough and sputum production. Negative for shortness of breath, wheezing and stridor.   Cardiovascular:  Negative for chest pain, palpitations and leg swelling.  Gastrointestinal:  Negative for abdominal pain, diarrhea, nausea and vomiting.  Musculoskeletal:  Negative for myalgias.  Skin:  Negative for rash.  Neurological:  Positive for headaches. Negative for dizziness.       Light headed     Observations/Objective: Patient appears well, but fatigued (lying in bed) Not distressed Weight is baseline  No facial swelling or asymmetry Voice is mildly hoarse No obvious tremor or mobility impairment Moving neck and UEs normally Able to hear the call well  Cough sounds junky, no sob or wheeze heard with conversation Talkative and mentally sharp with no cognitive changes No skin changes on face or neck , no rash or pallor Affect is normal    Assessment and Plan: Problem List Items Addressed This Visit       Other   COVID-19    Day 2 with fever and uri symptoms  Px molnupiravir (risk factor includes age and h/o PE in the past) Moderate symptoms Disc symptom care with tylenol  and mucinex DM and nasal saline Rest and fluids ER precautions discussed Update if not starting to improve in a week or if worsening  Update if any wheezing or chest tightness       Relevant Medications   molnupiravir EUA (LAGEVRIO) 200 mg CAPS capsule     Follow Up Instructions:   Take the molnupiravir (anti viral) for covid 19  Drink fluids and rest  Mucinex DM is good for cough and congestion  Nasal saline spray helps with nasal stuffiness  Tylenol for pain and fever as directed  Update if not starting to improve in a week or if worsening   If worse or new symptoms let us know  If chest tightness/wheezing let us know  If  short of breath-go to ER if severe  I discussed the assessment and treatment plan with the patient. The patient was provided an opportunity to ask questions and all were answered. The patient agreed with the plan and demonstrated an understanding of the instructions.   The patient was advised to call back or seek an in-person evaluation if the symptoms worsen or if the condition fails to improve as anticipated.    Loura Pardon, MD

## 2021-04-07 ENCOUNTER — Other Ambulatory Visit: Payer: 59

## 2021-04-12 NOTE — Telephone Encounter (Signed)
Pt was scheduled for lab apt for 10/13 but advised there would need to be a lab draw and not POCT. Pt agreed to make coumadin clinic apt for 10/14 at 1045. Pt verbalized understanding.

## 2021-04-14 ENCOUNTER — Other Ambulatory Visit: Payer: 59

## 2021-04-15 ENCOUNTER — Other Ambulatory Visit: Payer: Self-pay

## 2021-04-15 ENCOUNTER — Ambulatory Visit (INDEPENDENT_AMBULATORY_CARE_PROVIDER_SITE_OTHER): Payer: 59

## 2021-04-15 DIAGNOSIS — Z86711 Personal history of pulmonary embolism: Secondary | ICD-10-CM

## 2021-04-15 DIAGNOSIS — Z7901 Long term (current) use of anticoagulants: Secondary | ICD-10-CM | POA: Diagnosis not present

## 2021-04-15 LAB — POCT INR: INR: 2.2 (ref 2.0–3.0)

## 2021-04-15 NOTE — Patient Instructions (Addendum)
Pre visit review using our clinic review tool, if applicable. No additional management support is needed unless otherwise documented below in the visit note.  Continue taking 1 tablet everyday EXCEPT take 0.5 tablet on Monday, Wednesday, and Friday. Recheck in 5 weeks.

## 2021-04-17 NOTE — Progress Notes (Signed)
Agree. Thanks

## 2021-04-18 NOTE — Progress Notes (Signed)
Called the patient to ensure he was doing okay, he stated that he was fine and the only thing he was dealing with was some congestion in which he is taking Mucinex for. Patient stated that he was appreciative for the call.

## 2021-05-03 ENCOUNTER — Other Ambulatory Visit: Payer: Self-pay | Admitting: Family Medicine

## 2021-05-03 DIAGNOSIS — Z7901 Long term (current) use of anticoagulants: Secondary | ICD-10-CM

## 2021-05-04 NOTE — Telephone Encounter (Signed)
Pt compliant with coumadin management. Sent in refill. 

## 2021-05-06 ENCOUNTER — Telehealth: Payer: Self-pay | Admitting: *Deleted

## 2021-05-06 NOTE — Telephone Encounter (Signed)
Received fax from [facility] asking for refill of sildenafil 100mg , medication is not on active med list. Pt has cialis on med list, pt should have followed up in July and he didn't. Pt will need to be seen before refills can be done.  .left message to have patient return my call.

## 2021-05-10 ENCOUNTER — Other Ambulatory Visit: Payer: Self-pay | Admitting: Urology

## 2021-05-10 DIAGNOSIS — N138 Other obstructive and reflux uropathy: Secondary | ICD-10-CM

## 2021-05-11 NOTE — Telephone Encounter (Signed)
Pt returned call and scheduled appt w/Shannon tomorrow at 9.

## 2021-05-12 ENCOUNTER — Encounter: Payer: Self-pay | Admitting: Urology

## 2021-05-12 ENCOUNTER — Other Ambulatory Visit: Payer: Self-pay

## 2021-05-12 ENCOUNTER — Ambulatory Visit: Payer: 59 | Admitting: Urology

## 2021-05-12 VITALS — BP 118/78 | HR 66 | Ht 71.0 in | Wt 265.0 lb

## 2021-05-12 DIAGNOSIS — N138 Other obstructive and reflux uropathy: Secondary | ICD-10-CM | POA: Diagnosis not present

## 2021-05-12 DIAGNOSIS — N529 Male erectile dysfunction, unspecified: Secondary | ICD-10-CM | POA: Diagnosis not present

## 2021-05-12 DIAGNOSIS — N401 Enlarged prostate with lower urinary tract symptoms: Secondary | ICD-10-CM

## 2021-05-12 MED ORDER — TADALAFIL 5 MG PO TABS: 5.0000 mg | ORAL_TABLET | Freq: Every day | ORAL | 11 refills | Status: DC

## 2021-05-12 MED ORDER — SILDENAFIL CITRATE 100 MG PO TABS: 100.0000 mg | ORAL_TABLET | Freq: Every day | ORAL | 6 refills | Status: DC | PRN

## 2021-05-12 NOTE — Progress Notes (Signed)
10:05 AM   Ronald Hatfield [birth year] [id number]  Referring provider: Tonia Ghent, MD [address] [place],  [place] [address]  Chief Complaint  Patient presents with   Benign Prostatic Hypertrophy    Urological history: 1. ED -Contributing factors are age, BPH, testosterone deficiency, hyperlipidemia, Peyronie's disease and anticoagulation therapy -SHIM 13 -managed with sildenafil 100 mg, on-demand-dosing  2.  Testosterone deficiency -Contributing factors of age, obesity and hyperglycemia -discontinued secondary to clotting issues   3. BPH with LU TS -PSA pending -I PSS 7/2   HPI: Ronald Hatfield is a 55 y.o. male who presents today for yearly visit.   UA negative.    Patient still having spontaneous erections.  He denies any pain or curvature with erections.     SHIM     Row Name 05/12/21 0944         SHIM: Over the last 6 months:   How do you rate your confidence that you could get and keep an erection? Low     When you had erections with sexual stimulation, how often were your erections hard enough for penetration (entering your partner)? Most Times (much more than half the time)     During sexual intercourse, how often were you able to maintain your erection after you had penetrated (entered) your partner? Sometimes (about half the time)     During sexual intercourse, how difficult was it to maintain your erection to completion of intercourse? Extremely Difficult     When you attempted sexual intercourse, how often was it satisfactory for you? Sometimes (about half the time)       SHIM Total Score   SHIM 13               Score: 1-7 Severe ED 8-11 Moderate ED 12-16 Mild-Moderate ED 17-21 Mild ED 22-25 No ED  Slow stream in the am and mild urgency.  Patient denies any modifying or aggravating factors.  Patient denies any gross hematuria, dysuria or suprapubic/flank pain.  Patient denies any fevers, chills, nausea or vomiting.     IPSS      Row Name 05/12/21 0900         International Prostate Symptom Score   How often have you had the sensation of not emptying your bladder? Less than 1 in 5     How often have you had to urinate less than every two hours? Less than half the time     How often have you found you stopped and started again several times when you urinated? Less than 1 in 5 times     How often have you found it difficult to postpone urination? Less than 1 in 5 times     How often have you had a weak urinary stream? Less than half the time     How often have you had to strain to start urination? Not at All     How many times did you typically get up at night to urinate? None     Total IPSS Score 7       Quality of Life due to urinary symptoms   If you were to spend the rest of your life with your urinary condition just the way it is now how would you feel about that? Mostly Satisfied               Score:  1-7 Mild 8-19 Moderate 20-35 Severe   PMH: Past Medical History:  Diagnosis  Date   Attention deficit disorder without mention of hyperactivity    Depressive disorder, not elsewhere classified    Dermatophytosis of the body    Encounter for therapeutic drug monitoring    History of DVT (deep vein thrombosis)    Impotence of organic origin    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, hand    Long term (current) use of anticoagulants    Other abnormal glucose    Other pulmonary embolism and infarction    Hospitalization 09/12/08-09/16/08   Overweight(278.02)    Pure hypercholesterolemia    RLS (restless legs syndrome)    in distant past   Routine general medical examination at a health care facility    Unspecified viral infection, in conditions classified elsewhere and of unspecified site     Surgical History: Past Surgical History:  Procedure Laterality Date   LIPOMA EXCISION  1994   Right clavicular head   VASECTOMY  02/07/02   Dr. Council Mechanic    Home  Medications:  Allergies as of 05/12/2021   No Known Allergies      Medication List        Accurate as of May 12, 2021 10:05 AM. If you have any questions, ask your nurse or doctor.          celecoxib 200 MG capsule Commonly known as: CELEBREX TAKE ONE CAPSULE BY MOUTH ONE TIME DAILY WITH FOOD   cetirizine 10 MG tablet Commonly known as: ZYRTEC Take 10 mg by mouth as needed for allergies.   sildenafil 100 MG tablet Commonly known as: VIAGRA Take 1 tablet (100 mg total) by mouth daily as needed for erectile dysfunction. Take two hours prior to intercourse on an empty stomach Started by: Zara Council, PA-C   tadalafil 5 MG tablet Commonly known as: CIALIS Take 1 tablet (5 mg total) by mouth daily. What changed: Another medication with the same name was removed. Continue taking this medication, and follow the directions you see here. Changed by: Zara Council, PA-C   valACYclovir 1000 MG tablet Commonly known as: VALTREX TAKE TWO TABLETS BY MOUTH TWICE A DAY AS NEEDED (FOR ONE DAY)   warfarin 3 MG tablet Commonly known as: Jantoven Take as directed by the anticoagulation clinic. If you are unsure how to take this medication, talk to your nurse or doctor. Original instructions: TAKE ONE TABLET BY MOUTH ONE TIME DAILY EXCEPT 1/2 TABLET ON MONDAY, WEDNESDAY AND FRIDAY OR AS INSTRUCTED BY COUMADIN CLINIC        Allergies: No Known Allergies  Family History: Family History  Problem Relation Age of Onset   Alcohol abuse Father    Deep vein thrombosis Father        h/o phlebitis   Deep vein thrombosis Mother    Skin cancer Mother    Lung cancer Mother    Deep vein thrombosis Sister    Alzheimer's disease Paternal Grandmother    Alcohol abuse Maternal Grandfather    Diabetes Maternal Grandmother    Coronary artery disease Maternal Grandmother    Stroke Maternal Grandmother    Prostate cancer Neg Hx    Colon cancer Neg Hx    Other Neg Hx         pituitary dz    Social History:  reports that he has never smoked. He has quit using smokeless tobacco.  His smokeless tobacco use included chew. He reports current alcohol use. He reports that he does not use drugs.  ROS: For pertinent review  of systems please refer to history of present illness  Physical Exam: BP 118/78   Pulse 66   Ht 5\' 11"  (1.803 m)   Wt 265 lb (120.2 kg)   BMI 36.96 kg/m   Constitutional:  Well nourished. Alert and oriented, No acute distress. HEENT: [place] AT, mask in place.  Trachea midline Cardiovascular: No clubbing, cyanosis, or edema. Respiratory: Normal respiratory effort, no increased work of breathing. GU: No CVA tenderness.  No bladder fullness or masses.  Patient with circumcised phallus. Urethral meatus is patent.  No penile discharge. No penile lesions or rashes. Scrotum without lesions, cysts, rashes and/or edema.  Testicles are located scrotally bilaterally. No masses are appreciated in the testicles. Left and right epididymis are normal. Rectal: Patient with  normal sphincter tone. Anus and perineum without scarring or rashes. No rectal masses are appreciated. Prostate is approximately 50 grams, could only palpate the apex, no nodules are appreciated. Seminal vesicles could not be palpated Neurologic: Grossly intact, no focal deficits, moving all 4 extremities. Psychiatric: Normal mood and affect.   Laboratory Data: Component     Latest Ref Rng & Units 04/15/2021          INR     2.0 - 3.0 2.2   Component     Latest Ref Rng & Units 01/25/2021  Sodium     135 - 145 mEq/L 140  Potassium     3.5 - 5.1 mEq/L 3.9  Chloride     96 - 112 mEq/L 105  CO2     19 - 32 mEq/L 28  Glucose     70 - 99 mg/dL 100 (H)  BUN     6 - 23 mg/dL 16  Creatinine     0.40 - 1.50 mg/dL 1.08  Total Bilirubin     0.2 - 1.2 mg/dL 0.5  Alkaline Phosphatase     39 - 117 U/L 43  AST     0 - 37 U/L 20  ALT     0 - 53 U/L 29  Total Protein     6.0 - 8.3 g/dL 6.5   Albumin     3.5 - 5.2 g/dL 3.8  Calcium     8.4 - 10.5 mg/dL 8.9  GFR     >60.00 mL/min 76.25   Component     Latest Ref Rng & Units 01/25/2021  Cholesterol     0 - 200 mg/dL 179  Triglycerides     0.0 - 149.0 mg/dL 158.0 (H)  HDL Cholesterol     >39.00 mg/dL 45.80  VLDL     0.0 - 40.0 mg/dL 31.6  LDL (calc)     0 - 99 mg/dL 102 (H)  Total CHOL/HDL Ratio      4  NonHDL      133.22   Component     Latest Ref Rng & Units 01/25/2021  WBC     4.0 - 10.5 K/uL 4.4  RBC     4.22 - 5.81 Mil/uL 5.08  Hemoglobin     13.0 - 17.0 g/dL 15.2  HCT     39.0 - 52.0 % 44.8  MCV     78.0 - 100.0 fl 88.2  MCHC     30.0 - 36.0 g/dL 34.0  RDW     11.5 - 15.5 % 12.8  Platelets     150.0 - 400.0 K/uL 193.0  Neutrophils     43.0 - 77.0 % 58.7  Lymphocytes  12.0 - 46.0 % 23.2  Monocytes Relative     3.0 - 12.0 % 8.6  Eosinophil     0.0 - 5.0 % 8.4 (H)  Basophil     0.0 - 3.0 % 1.1  NEUT#     1.4 - 7.7 K/uL 2.6  Lymphocyte #     0.7 - 4.0 K/uL 1.0  Monocyte #     0.1 - 1.0 K/uL 0.4  Eosinophils Absolute     0.0 - 0.7 K/uL 0.4  Basophils Absolute     0.0 - 0.1 K/uL 0.0  WBC, UA     0 - 5 /hpf   Epithelial Cells (non renal)     0 - 10 /hpf   Casts     None seen /lpf   Cast Type     N/A   Mucus, UA     Not Estab.   Bacteria, UA     None seen/Few   I have reviewed the labs.  Assessment & Plan:    1. Erectile dysfunction -continue sildenafil 100 mg, on-demand-dosing -refill sent to pharmacy  2. BPH with LUTS -PSA pending -DRE benign -symptoms - weak stream in the am -continue conservative management, avoiding bladder irritants and timed voiding's -continue Cialis 5 mg daily-refills given  Return in about 1 year (around 05/12/2022) for IPSS, SHIM, UA, PSA and exam.  These notes generated with voice recognition software. I apologize for typographical errors.  Zara Council, PA-C  [facility] [address] [place], [place] [address] [phone]

## 2021-05-13 LAB — URINALYSIS, COMPLETE
Bilirubin, UA: NEGATIVE
Ketones, UA: NEGATIVE
Leukocytes,UA: NEGATIVE
Nitrite, UA: NEGATIVE
Protein,UA: NEGATIVE
RBC, UA: NEGATIVE
Specific Gravity, UA: 1.025 (ref 1.005–1.030)
Urobilinogen, Ur: 0.2 mg/dL (ref 0.2–1.0)
pH, UA: 6 (ref 5.0–7.5)

## 2021-05-13 LAB — MICROSCOPIC EXAMINATION
Bacteria, UA: NONE SEEN
Epithelial Cells (non renal): NONE SEEN /hpf (ref 0–10)

## 2021-05-13 LAB — PSA: Prostate Specific Ag, Serum: 0.6 ng/mL (ref 0.0–4.0)

## 2021-05-20 ENCOUNTER — Other Ambulatory Visit: Payer: Self-pay

## 2021-05-20 ENCOUNTER — Ambulatory Visit: Payer: 59

## 2021-05-20 DIAGNOSIS — Z7901 Long term (current) use of anticoagulants: Secondary | ICD-10-CM | POA: Diagnosis not present

## 2021-05-20 DIAGNOSIS — Z86711 Personal history of pulmonary embolism: Secondary | ICD-10-CM

## 2021-05-20 LAB — POCT INR: INR: 1.7 — AB (ref 2.0–3.0)

## 2021-05-20 NOTE — Patient Instructions (Addendum)
Pre visit review using our clinic review tool, if applicable. No additional management support is needed unless otherwise documented below in the visit note.  ncrease dose today to 1 tablet and then continue taking 1 tablet everyday EXCEPT take 0.5 tablet on Monday, Wednesday, and Friday. Recheck in 4 wks.

## 2021-05-20 NOTE — Progress Notes (Addendum)
Increase dose today to 1 tablet and then continue taking 1 tablet everyday EXCEPT take 0.5 tablet on Monday, Wednesday, and Friday. Recheck in 4 wks.   ======================= Agree.  Thanks.  Elsie Stain

## 2021-06-17 ENCOUNTER — Other Ambulatory Visit: Payer: Self-pay

## 2021-06-17 ENCOUNTER — Ambulatory Visit: Payer: 59

## 2021-06-17 DIAGNOSIS — Z7901 Long term (current) use of anticoagulants: Secondary | ICD-10-CM

## 2021-06-17 LAB — POCT INR: INR: 1.8 — AB (ref 2.0–3.0)

## 2021-06-17 NOTE — Patient Instructions (Addendum)
Pre visit review using our clinic review tool, if applicable. No additional management support is needed unless otherwise documented below in the visit note.  Increase dose today to 1 tablet and then change weekly dose to take 1 tablet daily except take 1/2 tablet on Mondays and Thursdays. Recheck in 5 wks.

## 2021-06-17 NOTE — Progress Notes (Signed)
° °  Increase dose today to 1 tablet and then change weekly dose to take 1 tablet daily except take 1/2 tablet on Mondays and Thursdays. Recheck in 5 wks per pt request

## 2021-07-22 ENCOUNTER — Ambulatory Visit: Payer: 59

## 2021-07-22 ENCOUNTER — Other Ambulatory Visit: Payer: Self-pay

## 2021-07-22 DIAGNOSIS — Z7901 Long term (current) use of anticoagulants: Secondary | ICD-10-CM

## 2021-07-22 DIAGNOSIS — Z86711 Personal history of pulmonary embolism: Secondary | ICD-10-CM

## 2021-07-22 LAB — POCT INR: INR: 1.8 — AB (ref 2.0–3.0)

## 2021-07-22 NOTE — Patient Instructions (Addendum)
Pre visit review using our clinic review tool, if applicable. No additional management support is needed unless otherwise documented below in the visit note.  Increase dose today to 1 1/2 tablet and then change weekly dose to take 1 tablet daily except take 1/2 tablet on Thursdays. Recheck in 5 wks

## 2021-07-22 NOTE — Progress Notes (Signed)
Increase dose today to 1 1/2 tablet and then change weekly dose to take 1 tablet daily except take 1/2 tablet on Thursdays. Recheck in 5 wks per pt request.

## 2021-08-03 ENCOUNTER — Other Ambulatory Visit: Payer: Self-pay | Admitting: Family Medicine

## 2021-08-15 ENCOUNTER — Encounter: Payer: Self-pay | Admitting: Family Medicine

## 2021-08-16 NOTE — Telephone Encounter (Signed)
Called patient and was able to move appt up to 08/18/21 with Eugenia Pancoast, NP

## 2021-08-18 ENCOUNTER — Other Ambulatory Visit: Payer: Self-pay

## 2021-08-18 ENCOUNTER — Encounter: Payer: Self-pay | Admitting: Family

## 2021-08-18 ENCOUNTER — Ambulatory Visit: Payer: 59 | Admitting: Family

## 2021-08-18 VITALS — BP 132/84 | HR 96 | Temp 98.7°F | Ht 72.0 in | Wt 275.0 lb

## 2021-08-18 DIAGNOSIS — L03032 Cellulitis of left toe: Secondary | ICD-10-CM | POA: Diagnosis not present

## 2021-08-18 MED ORDER — CEPHALEXIN 500 MG PO CAPS: 500.0000 mg | ORAL_CAPSULE | Freq: Three times a day (TID) | ORAL | 0 refills | Status: AC

## 2021-08-18 NOTE — Assessment & Plan Note (Addendum)
Treating for cellulitis patient on blood thinner so hesitant to pop the abscess, patient has podiatrist already, Dr. Caryl Comes at [facility],  attempted to call the office to get him in sooner he will go over to the office and see if they can get him in today or tomorrow as suspected left great toenail ingrown.  I have sent cephalexin to the pharmacy and notified him to let his specialist know that he is starting an antibiotic as he is currently on Coumadin.  Also notified to do warm Epsom salt bath soaks throughout the day

## 2021-08-18 NOTE — Assessment & Plan Note (Signed)
Cephalexin sent to pharmacy patient to take start taking

## 2021-08-18 NOTE — Progress Notes (Signed)
Established Patient Office Visit  Subjective:  Patient ID: Ronald Hatfield, male    DOB: 12/06/1965  Age: 56 y.o. MRN: [id number]  CC:  Chief Complaint  Patient presents with   Nail Problem    Pt stated--injured left great toenail-have blister, discoloration and right toenail --4 days .     HPI Ronald Hatfield is here today with concerns.   Left great toenail with bruising and tenderness surrounding nail bed. There is also redness at base of nail and squishy when touched. Raised as well he does have fungus in nail.   Also noted right great toe with mild bruising surrounding nail bed, also fungus of nail. Pt was walking a longer distance four days ago with tight dress shoes.   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 Council Mechanic    Family History  Problem Relation Age of Onset   Alcohol abuse Father    Deep vein thrombosis Father        h/o phlebitis   Deep vein thrombosis Mother    Skin cancer Mother    Lung cancer Mother    Deep vein thrombosis Sister    Alzheimer's disease Paternal Grandmother    Alcohol abuse  Maternal Grandfather    Diabetes Maternal Grandmother    Coronary artery disease Maternal Grandmother    Stroke Maternal Grandmother    Prostate cancer Neg Hx    Colon cancer Neg Hx    Other Neg Hx        pituitary dz    Social History   Socioeconomic History   Marital status: Married    Spouse name: Not on file   Number of children: 2   Years of education: Not on file   Highest education level: Not on file  Occupational History   Occupation: Mining engineer since 2007  Tobacco Use   Smoking status: Never   Smokeless tobacco: Former    Types: Nurse, children's Use: Never used  Substance and Sexual Activity   Alcohol use: Yes    Comment: occasional   Drug use: No   Sexual activity: Yes  Other Topics Concern   Not on file  Social History Narrative   Married 1993   Children: 2 CHILDREN    Occupation: Pharmacist, community since 2007, ([employer]   Likes to play golf   [employer] grad   Social Determinants of Radio broadcast assistant Strain: Not on file  Food Insecurity: Not on file  Transportation Needs: Not on file  Physical Activity: Not on file  Stress: Not on file  Social Connections: Not on file  Intimate Partner Violence: Not on file    Outpatient Medications Prior to Visit  Medication Sig Dispense Refill   celecoxib (CELEBREX) 200 MG capsule TAKE ONE CAPSULE BY MOUTH ONE TIME DAILY WITH FOOD 90 capsule 0   cetirizine (ZYRTEC) 10 MG tablet Take 10 mg by mouth as needed for allergies.     sildenafil (VIAGRA) 100 MG tablet Take 1 tablet (100 mg total) by mouth daily as needed for erectile dysfunction. Take two hours prior to intercourse on an empty stomach 30 tablet 6   tadalafil (CIALIS) 5 MG tablet Take 1 tablet (5 mg total) by mouth daily. 30 tablet 11   valACYclovir (VALTREX) 1000 MG tablet TAKE TWO TABLETS BY MOUTH TWICE A DAY AS NEEDED (FOR ONE DAY) 20 tablet 11   warfarin (JANTOVEN) 3 MG tablet TAKE ONE TABLET BY  MOUTH ONE TIME DAILY EXCEPT 1/2 TABLET ON MONDAY, WEDNESDAY AND FRIDAY OR AS INSTRUCTED BY COUMADIN CLINIC 110 tablet 0   No facility-administered medications prior to visit.    No Known Allergies  ROS Review of Systems  Constitutional:  Negative for chills and fever.  Skin:  Positive for rash (left great toe with surrounding toe nail bed redness and tenderness, fungal skin change of nail).       Right toe nail with bruising right side of nail no tenderness     Objective:    Physical Exam Constitutional:      General: He is not in acute distress.    Appearance: Normal appearance. He is obese. He is not ill-appearing, toxic-appearing or diaphoretic.  Pulmonary:     Effort: Pulmonary effort is normal.  Musculoskeletal:     Right foot: Normal range of motion.     Left foot: Normal range of motion.  Feet:     Right foot:     Toenail Condition: Right toenails are abnormally thick. Fungal disease present.    Left foot:     Toenail Condition: Left toenails are abnormally thick and ingrown. Fungal disease present.    Comments: Left great toe with nail bed base with swelling and redness, tender to touch. Bruising at left lateral side, toe nail appears to be ingrown, Fluctuant and appears to be filled with blood. Small strip redness with warmth base of toe nail bed.   Right great toe with bruising right lateral side.  Neurological:     General: No focal deficit present.     Mental Status: He is alert and oriented to person, place, and time.        BP 132/84    Pulse 96    Temp 98.7 F (37.1 C)    Ht 6' (1.829 m)    Wt 275 lb (124.7 kg)    SpO2 97%    BMI 37.30 kg/m  Wt Readings from Last 3 Encounters:  08/18/21 275 lb (124.7 kg)  05/12/21 265 lb (120.2 kg)  04/05/21 265 lb (120.2 kg)     Health Maintenance Due  Topic Date Due   COVID-19 Vaccine (4 - Booster for Pfizer series) 07/05/2020   INFLUENZA VACCINE  01/31/2021    There are no preventive care reminders to  display for this patient.  Lab Results  Component Value Date   TSH 2.58 11/05/2017   Lab Results  Component Value Date   WBC 4.4 01/25/2021   HGB 15.2 01/25/2021   HCT 44.8 01/25/2021  MCV 88.2 01/25/2021   PLT 193.0 01/25/2021   Lab Results  Component Value Date   NA 140 01/25/2021   K 3.9 01/25/2021   CO2 28 01/25/2021   GLUCOSE 100 (H) 01/25/2021   BUN 16 01/25/2021   CREATININE 1.08 01/25/2021   BILITOT 0.5 01/25/2021   ALKPHOS 43 01/25/2021   AST 20 01/25/2021   ALT 29 01/25/2021   PROT 6.5 01/25/2021   ALBUMIN 3.8 01/25/2021   CALCIUM 8.9 01/25/2021   GFR 76.25 01/25/2021   Lab Results  Component Value Date   HGBA1C 5.2 04/19/2011      Assessment & Plan:   Problem List Items Addressed This Visit       Musculoskeletal and Integument   Paronychia of great toe of left foot - Primary    Treating for cellulitis patient on blood thinner so hesitant to pop the abscess, patient has podiatrist already, Dr. Caryl Comes at [facility],  attempted to call the office to get him in sooner he will go over to the office and see if they can get him in today or tomorrow as suspected left great toenail ingrown.  I have sent cephalexin to the pharmacy and notified him to let his specialist know that he is starting an antibiotic as he is currently on Coumadin.  Also notified to do warm Epsom salt bath soaks throughout the day      Relevant Medications   cephALEXin (KEFLEX) 500 MG capsule     Other   Cellulitis of great toe of left foot    Cephalexin sent to pharmacy patient to take start taking       Meds ordered this encounter  Medications   cephALEXin (KEFLEX) 500 MG capsule    Sig: Take 1 capsule (500 mg total) by mouth 3 (three) times daily for 7 days.    Dispense:  21 capsule    Refill:  0    Order Specific Question:   Supervising Provider    Answer:   BEDSOLE, AMY E [[id number]    Follow-up: No follow-ups on file.    Eugenia Pancoast, FNP

## 2021-08-19 ENCOUNTER — Ambulatory Visit: Payer: 59

## 2021-08-19 DIAGNOSIS — Z7901 Long term (current) use of anticoagulants: Secondary | ICD-10-CM | POA: Diagnosis not present

## 2021-08-19 LAB — POCT INR: INR: 3.1 — AB (ref 2.0–3.0)

## 2021-08-19 NOTE — Progress Notes (Signed)
Hold dose today and then continue 1 tablet daily except take 1/2 tablet on Thursdays. Recheck in 4 weeks.

## 2021-08-19 NOTE — Patient Instructions (Addendum)
Pre visit review using our clinic review tool, if applicable. No additional management support is needed unless otherwise documented below in the visit note.  Hold dose today and then continue 1 tablet daily except take 1/2 tablet on Thursdays. Recheck in 4 weeks.

## 2021-08-26 ENCOUNTER — Ambulatory Visit: Payer: 59

## 2021-08-29 ENCOUNTER — Ambulatory Visit: Payer: 59 | Admitting: Family Medicine

## 2021-09-15 ENCOUNTER — Ambulatory Visit: Payer: 59

## 2021-09-15 ENCOUNTER — Other Ambulatory Visit: Payer: Self-pay

## 2021-09-15 DIAGNOSIS — Z7901 Long term (current) use of anticoagulants: Secondary | ICD-10-CM

## 2021-09-15 DIAGNOSIS — Z86711 Personal history of pulmonary embolism: Secondary | ICD-10-CM

## 2021-09-15 LAB — POCT INR: INR: 1.8 — AB (ref 2.0–3.0)

## 2021-09-15 NOTE — Patient Instructions (Addendum)
Pre visit review using our clinic review tool, if applicable. No additional management support is needed unless otherwise documented below in the visit note. ? ?Increase dose today to take 1 tablet and then continue 1 tablet daily except take 1/2 tablet on Thursdays. Recheck in 4 weeks.  ?

## 2021-09-15 NOTE — Progress Notes (Signed)
Increase dose today to take 1 tablet and then continue 1 tablet daily except take 1/2 tablet on Thursdays. Recheck in 4 weeks.  ?

## 2021-10-13 ENCOUNTER — Ambulatory Visit: Payer: 59

## 2021-10-13 DIAGNOSIS — Z7901 Long term (current) use of anticoagulants: Secondary | ICD-10-CM

## 2021-10-13 LAB — POCT INR: INR: 1.7 — AB (ref 2.0–3.0)

## 2021-10-13 NOTE — Patient Instructions (Addendum)
Pre visit review using our clinic review tool, if applicable. No additional management support is needed unless otherwise documented below in the visit note.  Increase dose today to take 1 1/2 tablets and then change weekly dose to take 1 tablet daily.  Recheck in 3 weeks.  

## 2021-10-13 NOTE — Progress Notes (Signed)
Increase dose today to take 1 1/2 tablets and then change weekly dose to take 1 tablet daily.  Recheck in 3 weeks.  

## 2021-10-21 ENCOUNTER — Other Ambulatory Visit: Payer: Self-pay | Admitting: Family Medicine

## 2021-10-21 DIAGNOSIS — Z7901 Long term (current) use of anticoagulants: Secondary | ICD-10-CM

## 2021-10-24 NOTE — Telephone Encounter (Signed)
Pt is compliant with warfarin management and PCP apts. ?Sent in refill.  ?

## 2021-11-03 ENCOUNTER — Other Ambulatory Visit: Payer: Self-pay | Admitting: Family Medicine

## 2021-11-03 ENCOUNTER — Ambulatory Visit: Payer: 59

## 2021-11-03 DIAGNOSIS — Z86711 Personal history of pulmonary embolism: Secondary | ICD-10-CM

## 2021-11-03 DIAGNOSIS — E7800 Pure hypercholesterolemia, unspecified: Secondary | ICD-10-CM

## 2021-11-03 DIAGNOSIS — Z7901 Long term (current) use of anticoagulants: Secondary | ICD-10-CM | POA: Diagnosis not present

## 2021-11-03 DIAGNOSIS — R5383 Other fatigue: Secondary | ICD-10-CM

## 2021-11-03 LAB — POCT INR: INR: 3.4 — AB (ref 2.0–3.0)

## 2021-11-03 NOTE — Patient Instructions (Addendum)
Pre visit review using our clinic review tool, if applicable. No additional management support is needed unless otherwise documented below in the visit note. ? ?Hold dose today and then continue 1 tablet daily. Recheck in 4 weeks. ?

## 2021-11-03 NOTE — Progress Notes (Addendum)
Hold dose today and then continue 1 tablet daily. Recheck in 4 weeks per pt request due to being out of town. ?

## 2021-11-04 ENCOUNTER — Other Ambulatory Visit (INDEPENDENT_AMBULATORY_CARE_PROVIDER_SITE_OTHER): Payer: 59

## 2021-11-04 DIAGNOSIS — E7800 Pure hypercholesterolemia, unspecified: Secondary | ICD-10-CM

## 2021-11-04 DIAGNOSIS — R5383 Other fatigue: Secondary | ICD-10-CM | POA: Diagnosis not present

## 2021-11-04 LAB — TSH: TSH: 1.94 u[IU]/mL (ref 0.35–5.50)

## 2021-11-04 LAB — CBC WITH DIFFERENTIAL/PLATELET
Basophils Absolute: 0 10*3/uL (ref 0.0–0.1)
Basophils Relative: 0.8 % (ref 0.0–3.0)
Eosinophils Absolute: 0.3 10*3/uL (ref 0.0–0.7)
Eosinophils Relative: 6.8 % — ABNORMAL HIGH (ref 0.0–5.0)
HCT: 46.7 % (ref 39.0–52.0)
Hemoglobin: 15.9 g/dL (ref 13.0–17.0)
Lymphocytes Relative: 26 % (ref 12.0–46.0)
Lymphs Abs: 1.2 10*3/uL (ref 0.7–4.0)
MCHC: 33.9 g/dL (ref 30.0–36.0)
MCV: 88 fl (ref 78.0–100.0)
Monocytes Absolute: 0.4 10*3/uL (ref 0.1–1.0)
Monocytes Relative: 9.3 % (ref 3.0–12.0)
Neutro Abs: 2.7 10*3/uL (ref 1.4–7.7)
Neutrophils Relative %: 57.1 % (ref 43.0–77.0)
Platelets: 202 10*3/uL (ref 150.0–400.0)
RBC: 5.31 Mil/uL (ref 4.22–5.81)
RDW: 13.2 % (ref 11.5–15.5)
WBC: 4.7 10*3/uL (ref 4.0–10.5)

## 2021-11-04 LAB — COMPREHENSIVE METABOLIC PANEL
ALT: 25 U/L (ref 0–53)
AST: 16 U/L (ref 0–37)
Albumin: 4.3 g/dL (ref 3.5–5.2)
Alkaline Phosphatase: 50 U/L (ref 39–117)
BUN: 22 mg/dL (ref 6–23)
CO2: 30 mEq/L (ref 19–32)
Calcium: 9 mg/dL (ref 8.4–10.5)
Chloride: 101 mEq/L (ref 96–112)
Creatinine, Ser: 1.16 mg/dL (ref 0.40–1.50)
GFR: 69.61 mL/min (ref >60.00)
Glucose, Bld: 97 mg/dL (ref 70–99)
Potassium: 4.2 mEq/L (ref 3.5–5.1)
Sodium: 138 mEq/L (ref 135–145)
Total Bilirubin: 0.8 mg/dL (ref 0.2–1.2)
Total Protein: 6.8 g/dL (ref 6.0–8.3)

## 2021-11-04 LAB — LIPID PANEL
Cholesterol: 244 mg/dL — ABNORMAL HIGH (ref 0–200)
HDL: 54.2 mg/dL (ref >39.00)
LDL Cholesterol: 155 mg/dL — ABNORMAL HIGH (ref 0–99)
NonHDL: 190.16
Total CHOL/HDL Ratio: 5
Triglycerides: 177 mg/dL — ABNORMAL HIGH (ref 0.0–149.0)
VLDL: 35.4 mg/dL (ref 0.0–40.0)

## 2021-12-01 ENCOUNTER — Ambulatory Visit: Payer: 59

## 2021-12-01 DIAGNOSIS — Z7901 Long term (current) use of anticoagulants: Secondary | ICD-10-CM

## 2021-12-01 DIAGNOSIS — Z86711 Personal history of pulmonary embolism: Secondary | ICD-10-CM

## 2021-12-01 LAB — POCT INR: INR: 2.8 (ref 2.0–3.0)

## 2021-12-01 NOTE — Patient Instructions (Addendum)
Pre visit review using our clinic review tool, if applicable. No additional management support is needed unless otherwise documented below in the visit note.  Continue 1 tablet daily. Recheck in 6 weeks

## 2021-12-01 NOTE — Progress Notes (Signed)
Continue 1 tablet daily. Recheck in 6 weeks per pt request due to being out of town.

## 2022-01-06 ENCOUNTER — Telehealth: Payer: Self-pay | Admitting: Family Medicine

## 2022-01-06 NOTE — Telephone Encounter (Signed)
Called patient and he is not currently having chest pain or SOB. Asked patient to scheduled CPE for next month as he is due for this after 02/03/22. Patient states oh so he wants me to wait until next month and I states yes sir that is when you will be due for your CPE. He said well tell Dr. Damita Dunnings I received his message and then hung up on me.

## 2022-01-06 NOTE — Telephone Encounter (Signed)
Patient called and is requesting Dr Damita Dunnings to schedule him a calcium scan at [facility]. I offered to schedule appt but he declined because he said he will paying out of pocket.

## 2022-01-06 NOTE — Telephone Encounter (Addendum)
We should discuss his concern at a office visit, either CPE or another visit in the meantime.  If he wants to schedule sooner than his CPE, then I am okay with that.  Coronary CT may or may not be most appropriate and we need to consider that at the [place].    I called and explained the above to patient.  He asked to get scheduled in the meantime.  He denied CP or SOB or new sx that would direct triage at this point.  In this case, outpatient f/u here is reasonable.  Please call pt next week per his request and schedule OV.  Thanks.

## 2022-01-06 NOTE — Telephone Encounter (Signed)
If having chest pain or SOB then let me know.  If not, then schedule CPE and we'll talk about.  Thanks.

## 2022-01-09 NOTE — Telephone Encounter (Signed)
Called patient to schedule ov to discuss the scan he wants to have done. Patient decided to not schedule anymore and is meeting with another doctor on 01/31/22. Patient states if all goes well they be his new doctor.

## 2022-01-10 NOTE — Telephone Encounter (Signed)
I'll defer to patient.

## 2022-01-12 ENCOUNTER — Ambulatory Visit: Payer: 59

## 2022-01-19 ENCOUNTER — Ambulatory Visit: Payer: 59

## 2022-01-19 DIAGNOSIS — Z7901 Long term (current) use of anticoagulants: Secondary | ICD-10-CM | POA: Diagnosis not present

## 2022-01-19 DIAGNOSIS — Z86711 Personal history of pulmonary embolism: Secondary | ICD-10-CM

## 2022-01-19 LAB — POCT INR: INR: 1.6 — AB (ref 2.0–3.0)

## 2022-01-19 NOTE — Progress Notes (Addendum)
Possibly missed 2 doses, but definitely did miss 1 dose, yesterday and 4 days ago. Due to pt missing doses there will not be a long term change in his  dosing because he has been in range on this dose.  Increase dose today and tomorrow to take 1 1/2 tablets and then continue 1 tablet daily. Recheck in 4 weeks.

## 2022-01-19 NOTE — Patient Instructions (Addendum)
Pre visit review using our clinic review tool, if applicable. No additional management support is needed unless otherwise documented below in the visit note.  Increase dose today and tomorrow to take 1 1/2 tablets and then continue 1 tablet daily. Recheck in 4 weeks.

## 2022-01-24 ENCOUNTER — Other Ambulatory Visit: Payer: Self-pay | Admitting: Family Medicine

## 2022-01-24 DIAGNOSIS — Z7901 Long term (current) use of anticoagulants: Secondary | ICD-10-CM

## 2022-02-15 ENCOUNTER — Ambulatory Visit: Payer: Self-pay

## 2022-02-15 ENCOUNTER — Telehealth: Payer: Self-pay | Admitting: Family Medicine

## 2022-02-15 NOTE — Telephone Encounter (Signed)
Patient states he has moved over to [facility] and will not be needing any other INRs here  He is getting everything done at [facility]

## 2022-02-15 NOTE — Progress Notes (Signed)
Pt has set up with new provider outside of [facility]. Resolving anticoagulation episodes.

## 2022-02-15 NOTE — Telephone Encounter (Signed)
Please remove me as the PCP.  Thanks.

## 2022-02-15 NOTE — Telephone Encounter (Signed)
Noted, will resolve anticoagulation episodes.

## 2022-02-15 NOTE — Telephone Encounter (Signed)
Dr. Duncan has been removed as PCP. 

## 2022-02-16 ENCOUNTER — Ambulatory Visit: Payer: 59

## 2022-05-03 ENCOUNTER — Other Ambulatory Visit: Payer: Self-pay

## 2022-05-03 DIAGNOSIS — N138 Other obstructive and reflux uropathy: Secondary | ICD-10-CM

## 2022-05-09 ENCOUNTER — Other Ambulatory Visit: Payer: 59

## 2022-05-10 ENCOUNTER — Encounter: Payer: Self-pay | Admitting: Urology

## 2022-05-11 NOTE — Progress Notes (Unsigned)
3:37 PM   Ronald Hatfield 1965/09/09 [id number]  Referring provider: Tonia Ghent, MD [address] [place],  [place] [address]  Chief Complaint  Patient presents with   Benign Prostatic Hypertrophy   Erectile Dysfunction    Urological history: 1. ED -Contributing factors are age, BPH, testosterone deficiency, hyperlipidemia, Peyronie's disease and anticoagulation therapy -SHIM 9 -sildenafil 100 mg, on-demand-dosing  2.  Testosterone deficiency -Contributing factors of age, obesity and hyperglycemia -discontinued secondary to clotting issues   3. BPH with LU TS -PSA pending -I PSS 12/2   HPI: Ronald Hatfield is a 56 y.o. male who presents today for yearly visit.   He is also experiencing fatigue, loss of muscle mass, loss of libido and loss of concentration.   Patient still having spontaneous erections.  He denies any pain or curvature with erections.  He is having issues with maintaining erections.  He is taking 100 mg sildenafil, on-demand-dosing.     SHIM     Row Name 05/12/22 0932         SHIM: Over the last 6 months:   How do you rate your confidence that you could get and keep an erection? Very Low     When you had erections with sexual stimulation, how often were your erections hard enough for penetration (entering your partner)? Most Times (much more than half the time)     During sexual intercourse, how often were you able to maintain your erection after you had penetrated (entered) your partner? Almost Never or Never     During sexual intercourse, how difficult was it to maintain your erection to completion of intercourse? Extremely Difficult     When you attempted sexual intercourse, how often was it satisfactory for you? A Few Times (much less than half the time)       SHIM Total Score   SHIM 9                Score: 1-7 Severe ED 8-11 Moderate ED 12-16 Mild-Moderate ED 17-21 Mild ED 22-25 No ED  He has a weak stream in the am.   Patient denies any modifying or aggravating factors.  Patient denies any gross hematuria, dysuria or suprapubic/flank pain.  Patient denies any fevers, chills, nausea or vomiting.  He is taking tadalafil 5 mg daily.     IPSS     Row Name 05/12/22 0900         International Prostate Symptom Score   How often have you had the sensation of not emptying your bladder? Less than 1 in 5     How often have you had to urinate less than every two hours? About half the time     How often have you found you stopped and started again several times when you urinated? Less than 1 in 5 times     How often have you found it difficult to postpone urination? About half the time     How often have you had a weak urinary stream? About half the time     How often have you had to strain to start urination? Less than 1 in 5 times     How many times did you typically get up at night to urinate? None     Total IPSS Score 12       Quality of Life due to urinary symptoms   If you were to spend the rest of your life with your urinary condition just  the way it is now how would you feel about that? 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 Council Mechanic    Home Medications:  Allergies as of 05/12/2022   No Known Allergies      Medication List        Accurate as of May 12, 2022 11:59 PM. If you have any questions, ask your nurse or doctor.          celecoxib 200 MG capsule Commonly known as: CELEBREX TAKE ONE CAPSULE BY MOUTH ONE TIME DAILY WITH FOOD   cetirizine 10 MG tablet Commonly known as: ZYRTEC Take 10 mg by mouth as needed for allergies.   lisdexamfetamine 30 MG capsule Commonly known as: VYVANSE TAKE ONE CAPSULE BY MOUTH EVERY MORNING, FILL 03-09-22   Saxenda 18 MG/3ML Sopn Generic drug: Liraglutide -Weight Management Inject into the skin.   sildenafil 100 MG tablet Commonly known as: VIAGRA Take 1 tablet (100 mg total) by mouth daily as needed for erectile dysfunction. Take two hours prior to intercourse on an empty stomach   tadalafil 5 MG tablet Commonly known as: CIALIS Take 1 tablet (5 mg total) by mouth daily.   valACYclovir 1000 MG tablet Commonly known as: VALTREX TAKE TWO TABLETS BY MOUTH TWICE A DAY AS NEEDED (FOR ONE DAY)   warfarin 3 MG tablet Commonly known as: Jantoven TAKE ONE TABLET BY MOUTH ONE TIME DAILY OR AS INSTRUCTED BY COUMADIN CLINIC   Xarelto 20 MG Tabs tablet Generic drug: rivaroxaban Take 20 mg by mouth daily.        Allergies: No Known Allergies  Family History: Family History  Problem Relation Age of Onset   Alcohol abuse Father    Deep vein thrombosis Father        h/o phlebitis   Deep vein thrombosis Mother    Skin cancer Mother    Lung cancer Mother    Deep vein thrombosis Sister    Alzheimer's disease Paternal Grandmother    Alcohol abuse Maternal Grandfather    Diabetes Maternal Grandmother    Coronary artery disease Maternal  Grandmother    Stroke Maternal Grandmother    Prostate cancer Neg Hx    Colon cancer Neg Hx    Other Neg Hx        pituitary dz    Social History:  reports that he has never smoked.  He has quit using smokeless tobacco.  His smokeless tobacco use included chew. He reports current alcohol use. He reports that he does not use drugs.  ROS: For pertinent review of systems please refer to history of present illness  Physical Exam: BP 127/89   Pulse 80   Ht 6' (1.829 m)   Wt 237 lb (107.5 kg)   BMI 32.14 kg/m   Constitutional:  Well nourished. Alert and oriented, No acute distress. HEENT:  AT, moist mucus membranes.  Trachea midline Cardiovascular: No clubbing, cyanosis, or edema. Respiratory: Normal respiratory effort, no increased work of breathing. GU: No CVA tenderness.  No bladder fullness or masses.  Patient with circumcised phallus.  Urethral meatus is patent.  No penile discharge. No penile lesions or rashes. Scrotum without lesions, cysts, rashes and/or edema.  Testicles are located scrotally bilaterally. No masses are appreciated in the testicles. Left and right epididymis are normal. Rectal: Patient with  normal sphincter tone. Anus and perineum without scarring or rashes. No rectal masses are appreciated. Prostate is approximately 50 grams, could only palpate the apex and the midportion of the gland, no nodules are appreciated. Seminal vesicles could not be palpated.  Neurologic: Grossly intact, no focal deficits, moving all 4 extremities. Psychiatric: Normal mood and affect.   Laboratory Data:    Latest Ref Rng & Units 11/04/2021   10:38 AM 01/25/2021   10:16 AM 05/20/2019   10:14 AM  CBC  WBC 4.0 - 10.5 K/uL 4.7  4.4  6.5   Hemoglobin 13.0 - 17.0 g/dL 15.9  15.2  15.7   Hematocrit 39.0 - 52.0 % 46.7  44.8  45.2   Platelets 150.0 - 400.0 K/uL 202.0  193.0  239.0        Latest Ref Rng & Units 11/04/2021   10:38 AM 01/25/2021   10:16 AM 05/20/2019   10:14 AM  CMP   Glucose 70 - 99 mg/dL 97  100  100   BUN 6 - 23 mg/dL '22  16  16   '$ Creatinine 0.40 - 1.50 mg/dL 1.16  1.08  1.09   Sodium 135 - 145 mEq/L 138  140  139   Potassium 3.5 - 5.1 mEq/L 4.2  3.9  3.9   Chloride 96 - 112 mEq/L 101  105  105   CO2 19 - 32 mEq/L '30  28  26   '$ Calcium 8.4 - 10.5 mg/dL 9.0  8.9  8.8   Total Protein 6.0 - 8.3 g/dL 6.8  6.5  6.6   Total Bilirubin 0.2 - 1.2 mg/dL 0.8  0.5  0.8   Alkaline Phos 39 - 117 U/L 50  43  49   AST 0 - 37 U/L '16  20  15   '$ ALT 0 - 53 U/L '25  29  23     '$ Lipid Panel     Component Value Date/Time   CHOL 244 (H) 11/04/2021 1038   TRIG 177.0 (H) 11/04/2021 1038   HDL 54.20 11/04/2021 1038   CHOLHDL 5 11/04/2021 1038   VLDL 35.4 11/04/2021 1038   LDLCALC 155 (H) 11/04/2021 1038   LDLDIRECT 150.3 04/17/2012 1049    Component     Latest Ref Rng 11/04/2021  TSH     0.35 - 5.50 uIU/mL 1.94   I have reviewed the labs.  Pertinent Imaging N/A  Assessment & Plan:    1. Erectile dysfunction -sildenafil 100 mg, on-demand-dosing -refill sent to pharmacy -testosterone level pending  -clearance request sent  to PCP, so if his testosterone returns below 300 he can start TRT if appropriate   2. BPH with LUTS -PSA pending -DRE benign -symptoms - weak stream in the am -continue conservative management, avoiding bladder irritants and timed voiding's -continue Cialis 5 mg daily-refills given  Return in about 1 year (around 05/13/2023) for IPSS, SHIM, PSA and exam.  These notes generated with voice recognition software. I apologize for typographical errors.  [facility], [place] [address] [place], [place] [address] [phone]

## 2022-05-12 ENCOUNTER — Telehealth: Payer: Self-pay

## 2022-05-12 ENCOUNTER — Ambulatory Visit: Payer: 59 | Admitting: Urology

## 2022-05-12 VITALS — BP 127/89 | HR 80 | Ht 72.0 in | Wt 237.0 lb

## 2022-05-12 DIAGNOSIS — N401 Enlarged prostate with lower urinary tract symptoms: Secondary | ICD-10-CM | POA: Diagnosis not present

## 2022-05-12 DIAGNOSIS — N138 Other obstructive and reflux uropathy: Secondary | ICD-10-CM | POA: Diagnosis not present

## 2022-05-12 DIAGNOSIS — N529 Male erectile dysfunction, unspecified: Secondary | ICD-10-CM

## 2022-05-12 MED ORDER — SILDENAFIL CITRATE 100 MG PO TABS: 100.0000 mg | ORAL_TABLET | Freq: Every day | ORAL | 6 refills | Status: DC | PRN

## 2022-05-12 MED ORDER — TADALAFIL 5 MG PO TABS: 5.0000 mg | ORAL_TABLET | Freq: Every day | ORAL | 3 refills | Status: DC

## 2022-05-12 NOTE — Telephone Encounter (Signed)
-----   Message from Nori Riis, PA-C sent at 05/12/2022 10:09 AM EST ----- Regarding: Clearance for TRT Would you send a clearance request to Dr. Frazier Richards to start testosterone replacement therapy?  He is on Xarelto for protein C deficiency.

## 2022-05-12 NOTE — Telephone Encounter (Signed)
Request for clearance submitted 05/12/22 to Dr Sharyon Cable office.

## 2022-05-13 LAB — PSA: Prostate Specific Ag, Serum: 0.9 ng/mL (ref 0.0–4.0)

## 2022-05-13 LAB — TESTOSTERONE: Testosterone: 296 ng/dL (ref 264–916)

## 2022-05-14 ENCOUNTER — Encounter: Payer: Self-pay | Admitting: Urology

## 2022-05-16 ENCOUNTER — Other Ambulatory Visit: Payer: Self-pay

## 2022-05-16 DIAGNOSIS — E349 Endocrine disorder, unspecified: Secondary | ICD-10-CM

## 2022-05-16 NOTE — Telephone Encounter (Signed)
Please see media

## 2022-05-17 ENCOUNTER — Other Ambulatory Visit: Payer: 59

## 2022-05-17 DIAGNOSIS — E349 Endocrine disorder, unspecified: Secondary | ICD-10-CM

## 2022-05-18 ENCOUNTER — Other Ambulatory Visit: Payer: Self-pay | Admitting: Urology

## 2022-05-18 LAB — TESTOSTERONE: Testosterone: 247 ng/dL — ABNORMAL LOW (ref 264–916)

## 2022-05-18 MED ORDER — NONFORMULARY OR COMPOUNDED ITEM: Status: DC

## 2022-05-31 NOTE — Progress Notes (Unsigned)
   06/01/2022 9:15 AM  Ronald Hatfield 1965-11-25 [id number]   Referring provider: Kirk Ruths, MD [place] [facility] [place],  [place] [address]  Urological history: 1. ED -Contributing factors are age, BPH, testosterone deficiency, hyperlipidemia, Peyronie's disease and anticoagulation therapy -sildenafil 100 mg, on-demand-dosing   2.  Testosterone deficiency -Contributing factors of age, obesity and hyperglycemia -discontinued secondary to clotting issues    3. BPH with LU TS -PSA (05/2022) 0.9  Chief Complaint  Patient presents with   Erectile Dysfunction    HPI: Ronald Hatfield is a 56 y.o. male who presents today for ICI titration. .    The procedure is discussed with patient.  He is allowed to ask questions.  Questions were answered to his satisfaction.  We were able to complete the titration.  He is having issues with maintaining erections with sildenafil.    Physical Exam:  BP 134/84   Pulse 92   Ht 6' (1.829 m)   Wt 238 lb (108 kg)   BMI 32.28 kg/m   Constitutional:  Well nourished. Alert and oriented, No acute distress. GU: No CVA tenderness.  No bladder fullness or masses.  Patient with circumcised phallus.  Urethral meatus is patent.  No penile discharge. No penile lesions or rashes. Scrotum without lesions, cysts, rashes and/or edema.   Psychiatric: Normal mood and affect.  Procedure  Patient's left corpus cavernosum is identified.  An area near the base of the penis is cleansed with rubbing alcohol.  Careful to avoid the dorsal vein, 0.2 mcg of Trimix (papaverine 30 mg, phentolamine 1 mg and prostaglandin E1 10 mcg, Lot # [id number]'$  exp # 06/29/2022 is injected at a 90 degree angle into the left corpus cavernosum near the base of the penis.  Patient experienced penile fullness in 15 minutes.    Patient's right corpus cavernosum is identified.  An area near the base of the penis is cleansed with rubbing alcohol.  Careful to  avoid the dorsal vein, 0.2 mcg of Trimix (papaverine 30 mg, phentolamine 1 mg and prostaglandin E1 10 mcg, Lot # [id number]  exp # 06/29/2022 is injected at a 90 degree angle into the right corpus cavernosum near the base of the penis.  Patient experienced penile fullness erection in 15 minutes.     Assessment & Plan:    1. Erectile Dysfunction -We did not achieve a satisfactory erection with 0.4 cc of Trimix (30/1/10)  -He will continue his titration at home at a different time starting with 0.4 cc of the Trimix.  I advised him not to increase the injection dose by more than 0.1 cc as he may risk having priapism. Advised patient of the condition of priapism, painful erection lasting for more than four hours, and to contact the office or seek treatment in the ED immediately    Return for Patient to call .  07/12/2022  [facility] [address] [place], [place] [phone]  I spent 15 minutes on the day of the encounter to include pre-visit record review, face-to-face time with the patient, and post-visit ordering of tests.

## 2022-06-01 ENCOUNTER — Other Ambulatory Visit: Payer: Self-pay | Admitting: Family Medicine

## 2022-06-01 ENCOUNTER — Ambulatory Visit: Payer: 59 | Admitting: Urology

## 2022-06-01 ENCOUNTER — Encounter: Payer: Self-pay | Admitting: Urology

## 2022-06-01 VITALS — BP 134/84 | HR 92 | Ht 72.0 in | Wt 238.0 lb

## 2022-06-01 DIAGNOSIS — E349 Endocrine disorder, unspecified: Secondary | ICD-10-CM

## 2022-06-01 DIAGNOSIS — N529 Male erectile dysfunction, unspecified: Secondary | ICD-10-CM | POA: Diagnosis not present

## 2022-06-01 NOTE — Patient Instructions (Signed)
TRIMIX SELF-INJECTION INSTRUCTIONS    DETAILED PROCEDURE  1. GETTING SET UP  A. Proper hygiene is important. Wash your hands and keep the penis clean.  B. Assemble the following:  - Bottle of Trimix  - Alcohol pad  - Syringe  C. Keep the Trimix cold by returning the bottle to the refrigerator, or by placing the bottle in a cup of ice.   2. PREPARE THE SYRINGE  A. Wipe the rubber top of the vial with an alcohol pad.  B. After removing the cap of the needle, pull the plunger back to the desired dosage, filling this volume with air. Use a new needle and syringe each time.  C. Insert the needle through the rubber top and inject the air into the vial.  D. Turn the vial with needle and syringe inserted upside down. Pull back on the syringe plunger in a slow and steady motion until the desired dosage is achieved.  E. Tap the side of the syringe (1cc tuberculin syringe with a 29 gauge needle) to allow any air bubbles to float towards the needle. Avoid having these air bubbles in the syringe when self-injecting by first injecting out the collected bubbles that may form.  F. Remove the needle from the bottle and replace the protective cap on the needle.    3. SELECT AND PREPARE THE SITE FOR INJECTION  A. The proper location for injection is at the 9-11 and 1-3 o'clock positions, between the base and mid-portion of the penis.(see diagram) Avoid the midline because of potential for injury to the urethra (6 o'clock; for urinary passage) and the penile arteries and nerves (near 12 o'clock). Avoid any visible veins or arteries on the surface.  B. Grasp and pull the head of the penis toward the side of your leg with the index finger and thumb (use the left hand, if right handed). While maintaining light tension, select a site for injection.  C. Clean the site with an alcohol pad.   4: INJECT TRIMIX AND APPLY COMPRESSION  A. With a steady and continuous motion, penetrate the skin with the needle at a 90 o  angle. The needle should then be advanced to the hub. Slight resistance is encountered as the needle passes into the proper position within the erectile tissue (corporeal body).  B. Inject the Trimix over approximately 4 seconds. Withdraw the needle from the penis and apply compression to the injection site for approximately 1 minute. Several minutes of compression may be required to avoid bleeding, especially if you are an aspirin user.  C. Replace the cap on the needle and dispose of properly.   If you experience a painful erection that will not go down, take four (30 mg) tablets of pseudoephedrine (Sudafed-not the extended release) and if the erection does not go down in the next hour or increases in pain, contact the office immediately or seek treatment in the ED    

## 2022-11-29 ENCOUNTER — Encounter: Payer: Self-pay | Admitting: Gastroenterology

## 2022-12-18 ENCOUNTER — Other Ambulatory Visit: Payer: Self-pay | Admitting: Family Medicine

## 2022-12-18 MED ORDER — NONFORMULARY OR COMPOUNDED ITEM: 6 refills | Status: DC

## 2023-05-02 ENCOUNTER — Other Ambulatory Visit: Payer: Self-pay | Admitting: Urology

## 2023-05-02 DIAGNOSIS — N138 Other obstructive and reflux uropathy: Secondary | ICD-10-CM

## 2023-05-09 ENCOUNTER — Other Ambulatory Visit: Payer: Self-pay

## 2023-05-09 DIAGNOSIS — N138 Other obstructive and reflux uropathy: Secondary | ICD-10-CM

## 2023-05-10 ENCOUNTER — Other Ambulatory Visit: Payer: 59

## 2023-05-10 DIAGNOSIS — N138 Other obstructive and reflux uropathy: Secondary | ICD-10-CM

## 2023-05-10 NOTE — Progress Notes (Signed)
05/15/2023 4:23 PM   Ronald Hatfield 1966/03/31 [id number]  Referring provider: Lauro Regulus, MD [address] [facility] [place],  [place] [address]  Urological history: 1. ED -Contributing factors are age, BPH, testosterone deficiency, hyperlipidemia, Peyronie's disease and anticoagulation therapy -failed PDE5i's  -Trimix (30/1/10) 0.8   2.  Testosterone deficiency -Contributing factors of age, obesity and hyperglycemia -discontinued secondary to clotting issues    3. BPH with LU TS -PSA (05/2023) 0.9  Chief Complaint  Patient presents with   Follow-up   HPI: Ronald Hatfield is a 57 y.o. male who presents today for follow up.   Previous records reviewed.   He mentioned phenomenon that started to occur yesterday.  He states in his left groin and testicular area has the sensation of  feeling like his cell phone is vibrating.  He denied any injury to the testicles.  He has not noticed any testicular swelling.  He does have some nerve issues in his neck, but he denies any in his back.    I PSS 9/3  He has no urinary complaints.  Patient denies any modifying or aggravating factors.  Patient denies any recent UTI's, gross hematuria, dysuria or suprapubic/flank pain.  Patient denies any fevers, chills, nausea or vomiting.     IPSS     Row Name 05/15/23 1500         International Prostate Symptom Score   How often have you had the sensation of not emptying your bladder? Less than half the time     How often have you had to urinate less than every two hours? Less than 1 in 5 times     How often have you found you stopped and started again several times when you urinated? Less than 1 in 5 times     How often have you found it difficult to postpone urination? Less than 1 in 5 times     How often have you had a weak urinary stream? Less than half the time     How often have you had to strain to start urination? Less than 1 in 5 times     How many  times did you typically get up at night to urinate? 1 Time     Total IPSS Score 9       Quality of Life due to urinary symptoms   If you were to spend the rest of your life with your urinary condition just the way it is now how would you feel about that? Mixed              Score:  1-7 Mild 8-19 Moderate 20-35 Severe   SHIM 10  He is having satisfactory erections with Trimix, but he is wondering if there are some other treatment available for him.     SHIM     Row Name 05/15/23 1506         SHIM: Over the last 6 months:   How do you rate your confidence that you could get and keep an erection? Low     When you had erections with sexual stimulation, how often were your erections hard enough for penetration (entering your partner)? Sometimes (about half the time)     During sexual intercourse, how often were you able to maintain your erection after you had penetrated (entered) your partner? A Few Times (much less than half the time)     During sexual intercourse, how difficult  was it to maintain your erection to completion of intercourse? Extremely Difficult     When you attempted sexual intercourse, how often was it satisfactory for you? 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 Hetty Ely    Home Medications:  Allergies as of 05/15/2023   No Known Allergies      Medication List        Accurate as of May 15, 2023  4:23 PM. If you have any questions, ask your nurse or doctor.          STOP taking these medications    Saxenda 18 MG/3ML Sopn Generic drug: Liraglutide -Weight Management       TAKE these medications    celecoxib 200 MG capsule Commonly known as: CELEBREX TAKE ONE CAPSULE BY MOUTH ONE TIME DAILY WITH FOOD   cetirizine 10 MG tablet Commonly known as: ZYRTEC Take 10 mg by mouth as needed for allergies.   lisdexamfetamine 30 MG capsule Commonly known as: VYVANSE TAKE ONE CAPSULE BY MOUTH EVERY MORNING, FILL 03-09-22   NONFORMULARY OR COMPOUNDED ITEM Trimix (30/1/10)-(Pap/Phent/PGE)  Dosage: Inject 0.8 cc per injection   Vial 1ml  Qty #10 Refills 6  [facility] [phone] Fax [phone]   sildenafil 100 MG tablet Commonly known as: VIAGRA Take 1 tablet (100 mg total) by mouth daily as needed for erectile dysfunction. Take two hours prior to intercourse on an empty stomach   tadalafil 5 MG tablet Commonly known as: CIALIS TAKE ONE TABLET BY MOUTH ONE TIME DAILY   valACYclovir 1000 MG tablet Commonly known as: VALTREX TAKE TWO TABLETS BY MOUTH TWICE A DAY AS NEEDED (FOR ONE DAY)   warfarin 3 MG tablet Commonly known as: Jantoven TAKE ONE TABLET BY MOUTH ONE TIME DAILY OR AS INSTRUCTED BY COUMADIN CLINIC   Xarelto 20 MG Tabs tablet Generic drug: rivaroxaban Take 20 mg by mouth daily.        Allergies: No  Known Allergies  Family History: Family History  Problem Relation Age of Onset   Alcohol abuse Father    Deep vein thrombosis Father        h/o phlebitis   Deep vein thrombosis Mother    Skin cancer Mother  Lung cancer Mother    Deep vein thrombosis Sister    Alzheimer's disease Paternal Grandmother    Alcohol abuse Maternal Grandfather    Diabetes Maternal Grandmother    Coronary artery disease Maternal Grandmother    Stroke Maternal Grandmother    Prostate cancer Neg Hx    Colon cancer Neg Hx    Other Neg Hx        pituitary dz    Social History:  reports that he has never smoked. He has quit using smokeless tobacco.  His smokeless tobacco use included chew. He reports current alcohol use. He reports that he does not use drugs.  ROS: Pertinent ROS in HPI  Physical Exam: BP (!) 143/93   Pulse 66   Ht 6' (1.829 m)   Wt 225 lb (102.1 kg)   BMI 30.52 kg/m   Constitutional:  Well nourished. Alert and oriented, No acute distress. HEENT: [place] AT, moist mucus membranes.  Trachea midline, no masses. Cardiovascular: No clubbing, cyanosis, or edema. Respiratory: Normal respiratory effort, no increased work of breathing. GU: No CVA tenderness.  No bladder fullness or masses.  Patient with circumcised phallus.  Urethral meatus is patent.  No penile discharge. No penile lesions or rashes. Scrotum without lesions, cysts, rashes and/or edema.  Testicles are located scrotally bilaterally. No masses are appreciated in the testicles. Left and right epididymis are normal. Rectal: Patient with  normal sphincter tone. Anus and perineum without scarring or rashes. No rectal masses are appreciated. Prostate is approximately 50 +  grams, could not palpate the gland, no nodules are appreciated. Seminal vesicles could not be palpated Neurologic: Grossly intact, no focal deficits, moving all 4 extremities. Psychiatric: Normal mood and affect.  Laboratory Data: Comprehensive Metabolic Panel  (CMP) Order: [id number] Component Ref Range & Units 3 mo ago  Glucose 70 - 110 mg/dL 98  Sodium 295 - 284 mmol/L 140  Potassium 3.6 - 5.1 mmol/L 4  Chloride 97 - 109 mmol/L 106  Carbon Dioxide (CO2) 22.0 - 32.0 mmol/L 27.3  Urea Nitrogen (BUN) 7 - 25 mg/dL 17  Creatinine 0.7 - 1.3 mg/dL 1.1  Glomerular Filtration Rate (eGFR) >60 mL/min/1.73sq m 77  Comment: CKD-EPI (2021) does not include patient's race in the calculation of eGFR.  Monitoring changes of plasma creatinine and eGFR over time is useful for monitoring kidney function.  Interpretive Ranges for eGFR (CKD-EPI 2021):  eGFR:       >60 mL/min/1.73 sq. m - Normal eGFR:       30-59 mL/min/1.73 sq. m - Moderately Decreased eGFR:       15-29 mL/min/1.73 sq. m  - Severely Decreased eGFR:       < 15 mL/min/1.73 sq. m  - Kidney Failure   Note: These eGFR calculations do not apply in acute situations when eGFR is changing rapidly or patients on dialysis.  Calcium 8.7 - 10.3 mg/dL 9  AST 8 - 39 U/L 15  ALT 6 - 57 U/L 21  Alk Phos (alkaline Phosphatase) 34 - 104 U/L 61  Albumin 3.5 - 4.8 g/dL 4  Bilirubin, Total 0.3 - 1.2 mg/dL 0.8  Protein, Total 6.1 - 7.9 g/dL 6.3  A/G Ratio 1.0 - 5.0 gm/dL 1.7  Resulting Agency [facility]   Specimen Collected: 02/07/23 09:22   Performed by: [facility] Last Resulted: 02/07/23 11:45  Received From: [facility]  Result Received: 05/09/23 17:02   Lipid Panel w/calc LDL Order: [id number]  Component Ref Range & Units 3 mo ago  Cholesterol, Total 100 - 200 mg/dL 657  Triglyceride 35 - 199 mg/dL 846  HDL (High Density Lipoprotein) Cholesterol 29.0 - 71.0 mg/dL 52  LDL Calculated 0 - 130 mg/dL 97  VLDL Cholesterol mg/dL 24  Cholesterol/HDL Ratio 3.3  Resulting Agency [facility]   Specimen Collected: 02/07/23 09:22   Performed by: [facility] Last Resulted: 02/07/23 11:45  Received From: [facility]  Result Received: 05/09/23 17:02  I have reviewed the labs.   Pertinent Imaging: N/A  Assessment & Plan:    1. ED - We discussed LI-ESWT is a possible alternative to Trimix, but I explained to him that we do not provide this treatment here at our office but that I could refer him to [facility] for a consultation appointment, he deferred at this time and we will discuss further  2. BPH with LU TS -PSA normal -No bothersome symptoms  3. Groin tingling  -Explained that the exam was benign, but we could schedule scrotal ultrasound to look at blood flow in the internal contents of the scrotum, but admitted that he will likely be negative -He would like to continue to monitor this and will consider scrotal ultrasound if the symptoms become persistent or more intense  Return in about 1 year (around 05/14/2024) for PSA, I PSS, SHIM and exam .  These notes generated with voice recognition software. I apologize for typographical errors.  Cloretta Ned  [facility] [address]  [address] [place], [place] [address] [phone]

## 2023-05-11 LAB — PSA: Prostate Specific Ag, Serum: 0.9 ng/mL (ref 0.0–4.0)

## 2023-05-15 ENCOUNTER — Encounter: Payer: Self-pay | Admitting: Urology

## 2023-05-15 ENCOUNTER — Ambulatory Visit: Payer: 59 | Admitting: Urology

## 2023-05-15 VITALS — BP 143/93 | HR 66 | Ht 72.0 in | Wt 225.0 lb

## 2023-05-15 DIAGNOSIS — N529 Male erectile dysfunction, unspecified: Secondary | ICD-10-CM | POA: Diagnosis not present

## 2023-05-15 DIAGNOSIS — R1032 Left lower quadrant pain: Secondary | ICD-10-CM

## 2023-05-15 DIAGNOSIS — N138 Other obstructive and reflux uropathy: Secondary | ICD-10-CM

## 2023-05-15 DIAGNOSIS — N401 Enlarged prostate with lower urinary tract symptoms: Secondary | ICD-10-CM

## 2023-05-30 ENCOUNTER — Other Ambulatory Visit: Payer: Self-pay | Admitting: Medical Genetics

## 2023-07-18 ENCOUNTER — Other Ambulatory Visit: Payer: Self-pay | Admitting: Medical Genetics

## 2023-07-19 ENCOUNTER — Other Ambulatory Visit
Admission: RE | Admit: 2023-07-19 | Discharge: 2023-07-19 | Disposition: A | Discharge status: Home / Self Care | Payer: Self-pay | Source: Ambulatory Visit | Attending: Medical Genetics | Admitting: Medical Genetics

## 2023-08-02 LAB — GENECONNECT MOLECULAR SCREEN: Genetic Analysis Overall Interpretation: NEGATIVE

## 2023-08-16 ENCOUNTER — Encounter: Payer: Self-pay | Admitting: Gastroenterology

## 2023-08-17 ENCOUNTER — Encounter
Admission: RE | Disposition: A | Discharge status: Home / Self Care | Payer: Self-pay | Source: Home / Self Care | Attending: Gastroenterology

## 2023-08-17 ENCOUNTER — Ambulatory Visit: Payer: 59 | Admitting: Certified Registered"

## 2023-08-17 ENCOUNTER — Encounter: Payer: Self-pay | Admitting: Gastroenterology

## 2023-08-17 ENCOUNTER — Ambulatory Visit
Admission: RE | Admit: 2023-08-17 | Discharge: 2023-08-17 | Disposition: A | Discharge status: Home / Self Care | Payer: 59 | Attending: Gastroenterology | Admitting: Gastroenterology

## 2023-08-17 DIAGNOSIS — Z860101 Personal history of adenomatous and serrated colon polyps: Secondary | ICD-10-CM | POA: Diagnosis not present

## 2023-08-17 DIAGNOSIS — Z1211 Encounter for screening for malignant neoplasm of colon: Secondary | ICD-10-CM | POA: Insufficient documentation

## 2023-08-17 DIAGNOSIS — Z5309 Procedure and treatment not carried out because of other contraindication: Secondary | ICD-10-CM | POA: Insufficient documentation

## 2023-08-17 HISTORY — PX: COLONOSCOPY WITH PROPOFOL: SHX5780

## 2023-08-17 SURGERY — COLONOSCOPY WITH PROPOFOL
Anesthesia: General

## 2023-08-17 MED ORDER — SODIUM CHLORIDE 0.9 % IV SOLN: INTRAVENOUS | Status: DC

## 2023-08-17 NOTE — OR Nursing (Signed)
Upon admission, pt stated to have taken wegovy inj on Tuesday 08/13/23 prior to todays appt; Dr Karlton Lemon was notified of; MD discussed risk of with pt; pt and wife in agreement to have procedure rescheduled.

## 2023-08-20 ENCOUNTER — Encounter: Payer: Self-pay | Admitting: Gastroenterology

## 2023-10-02 ENCOUNTER — Encounter: Payer: Self-pay | Admitting: Gastroenterology

## 2023-10-10 ENCOUNTER — Encounter: Payer: Self-pay | Admitting: Gastroenterology

## 2023-10-12 ENCOUNTER — Other Ambulatory Visit: Payer: Self-pay

## 2023-10-12 ENCOUNTER — Encounter
Admission: RE | Disposition: A | Discharge status: Home / Self Care | Payer: Self-pay | Source: Home / Self Care | Attending: Gastroenterology

## 2023-10-12 ENCOUNTER — Ambulatory Visit: Admitting: Anesthesiology

## 2023-10-12 ENCOUNTER — Encounter: Payer: Self-pay | Admitting: Gastroenterology

## 2023-10-12 ENCOUNTER — Ambulatory Visit
Admission: RE | Admit: 2023-10-12 | Discharge: 2023-10-12 | Disposition: A | Discharge status: Home / Self Care | Payer: 59 | Attending: Gastroenterology | Admitting: Gastroenterology

## 2023-10-12 DIAGNOSIS — Z7901 Long term (current) use of anticoagulants: Secondary | ICD-10-CM | POA: Insufficient documentation

## 2023-10-12 DIAGNOSIS — D123 Benign neoplasm of transverse colon: Secondary | ICD-10-CM | POA: Diagnosis not present

## 2023-10-12 DIAGNOSIS — D125 Benign neoplasm of sigmoid colon: Secondary | ICD-10-CM | POA: Diagnosis not present

## 2023-10-12 DIAGNOSIS — D128 Benign neoplasm of rectum: Secondary | ICD-10-CM | POA: Diagnosis not present

## 2023-10-12 DIAGNOSIS — Z86718 Personal history of other venous thrombosis and embolism: Secondary | ICD-10-CM | POA: Insufficient documentation

## 2023-10-12 DIAGNOSIS — D122 Benign neoplasm of ascending colon: Secondary | ICD-10-CM | POA: Insufficient documentation

## 2023-10-12 DIAGNOSIS — Z1211 Encounter for screening for malignant neoplasm of colon: Secondary | ICD-10-CM | POA: Diagnosis present

## 2023-10-12 DIAGNOSIS — K640 First degree hemorrhoids: Secondary | ICD-10-CM | POA: Insufficient documentation

## 2023-10-12 DIAGNOSIS — D6859 Other primary thrombophilia: Secondary | ICD-10-CM | POA: Diagnosis not present

## 2023-10-12 DIAGNOSIS — Z86711 Personal history of pulmonary embolism: Secondary | ICD-10-CM | POA: Insufficient documentation

## 2023-10-12 HISTORY — DX: Other specified behavioral and emotional disorders with onset usually occurring in childhood and adolescence: F98.8

## 2023-10-12 HISTORY — DX: Other specified abnormal findings of blood chemistry: R79.89

## 2023-10-12 HISTORY — PX: COLONOSCOPY WITH PROPOFOL: SHX5780

## 2023-10-12 HISTORY — DX: Other specified coagulation defects: D68.8

## 2023-10-12 HISTORY — DX: Other pulmonary embolism without acute cor pulmonale: I26.99

## 2023-10-12 HISTORY — DX: Personal history of pulmonary embolism: Z86.711

## 2023-10-12 SURGERY — COLONOSCOPY WITH PROPOFOL
Anesthesia: General

## 2023-10-12 MED ORDER — PROPOFOL 10 MG/ML IV BOLUS
INTRAVENOUS | Status: AC
  Filled 2023-10-12: qty 40

## 2023-10-12 MED ORDER — PROPOFOL 500 MG/50ML IV EMUL
INTRAVENOUS | Status: DC | PRN
  Administered 2023-10-12: 100 ug/kg/min via INTRAVENOUS

## 2023-10-12 MED ORDER — DEXMEDETOMIDINE HCL IN NACL 80 MCG/20ML IV SOLN
INTRAVENOUS | Status: DC | PRN
Start: 2023-10-12 — End: 2023-10-12
  Administered 2023-10-12 (×2): 4 ug via INTRAVENOUS
  Administered 2023-10-12: 8 ug via INTRAVENOUS

## 2023-10-12 MED ORDER — SODIUM CHLORIDE 0.9 % IV SOLN: INTRAVENOUS | Status: DC

## 2023-10-12 MED ORDER — PROPOFOL 10 MG/ML IV BOLUS
INTRAVENOUS | Status: AC
  Filled 2023-10-12: qty 20

## 2023-10-12 MED ORDER — PROPOFOL 10 MG/ML IV BOLUS
INTRAVENOUS | Status: DC | PRN
  Administered 2023-10-12 (×2): 50 mg via INTRAVENOUS
  Administered 2023-10-12 (×2): 100 mg via INTRAVENOUS
  Administered 2023-10-12: 50 mg via INTRAVENOUS

## 2023-10-12 NOTE — Interval H&P Note (Signed)
 History and Physical Interval Note: Preprocedure H&P from 10/12/23  was reviewed and there was no interval change after seeing and examining the patient.  Written consent was obtained from the patient after discussion of risks, benefits, and alternatives. Patient has consented to proceed with Colonoscopy with possible intervention   10/12/2023 12:37 PM  Ronald Hatfield  has presented today for surgery, with the diagnosis of Z86.0101 (ICD-10-CM) - Hx of adenomatous colonic polyps.  The various methods of treatment have been discussed with the patient and family. After consideration of risks, benefits and other options for treatment, the patient has consented to  Procedure(s): COLONOSCOPY WITH PROPOFOL (N/A) as a surgical intervention.  The patient's history has been reviewed, patient examined, no change in status, stable for surgery.  I have reviewed the patient's chart and labs.  Questions were answered to the patient's satisfaction.     Jaynie Collins

## 2023-10-12 NOTE — H&P (Signed)
 Pre-Procedure H&P   Patient ID: Ronald Hatfield is a 58 y.o. male.  Gastroenterology Provider: Jaynie Collins, DO  Referring Provider: Fransico Setters, NP PCP: Lauro Regulus, MD  Date: 10/12/2023  HPI [name] is a 58 y.o. male who presents today for Colonoscopy for Personal history of colon polyps .  Patient last underwent colonoscopy in July 2019 with 4 polyps split between tubular adenomas and hyperplastic.  Melanosis and internal hemorrhoids were also demonstrated.  Colonoscopy March 2016 with 2 adenomatous polyps.  February 2016 sigmoid colitis was noted  Father with history of colon polyps.  No family history of colon cancer  History of protein C deficiency and pulmonary embolism.  He is on Xarelto has been held for the procedure (Tuesday was last dose)  Ronald Hatfield has also been held for the procedure (hasn't taken >1 week)  Hemoglobin 17 MCV 85.7 platelets 245,000   Past Medical History:  Diagnosis Date   Attention deficit disorder (ADD) without hyperactivity    Attention deficit disorder without mention of hyperactivity    Depressive disorder, not elsewhere classified    Dermatophytosis of the body    Encounter for therapeutic drug monitoring    Hereditary protein C deficiency (HCC)    History of DVT (deep vein thrombosis)    History of pulmonary embolus (PE)    Impotence of organic origin    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, hand    Long term (current) use of anticoagulants    Low serum testosterone level    Other abnormal glucose    Other pulmonary embolism and infarction    Hospitalization 09/12/08-09/16/08   Overweight(278.02)    Pulmonary emboli (HCC)    Pure hypercholesterolemia    RLS (restless legs syndrome)    in distant past   Routine general medical examination at a health care facility    Unspecified viral infection, in conditions classified elsewhere and of unspecified site     Past Surgical  History:  Procedure Laterality Date   COLONOSCOPY WITH PROPOFOL N/A 08/17/2023   Procedure: COLONOSCOPY WITH PROPOFOL;  Surgeon: Jaynie Collins, DO;  Location: [facility] ENDOSCOPY;  Service: Gastroenterology;  Laterality: N/A;   LIPOMA EXCISION  1994   Right clavicular head   VASECTOMY  02/07/02   Dr. Hetty Ely    Family History Father- colon polyps No h/o GI disease or malignancy  Review of Systems  Constitutional:  Negative for activity change, appetite change, chills, diaphoresis, fatigue, fever and unexpected weight change.  HENT:  Negative for trouble swallowing and voice change.   Respiratory:  Negative for shortness of breath and wheezing.   Cardiovascular:  Negative for chest pain, palpitations and leg swelling.  Gastrointestinal:  Negative for abdominal distention, abdominal pain, anal bleeding, blood in stool, constipation, diarrhea, nausea and vomiting.  Musculoskeletal:  Negative for arthralgias and myalgias.  Skin:  Negative for color change and pallor.  Neurological:  Negative for dizziness, syncope and weakness.  Psychiatric/Behavioral:  Negative for confusion. The patient is not nervous/anxious.   All other systems reviewed and are negative.    Medications No current facility-administered medications on file prior to encounter.   Current Outpatient Medications on File Prior to Encounter  Medication Sig Dispense Refill   celecoxib (CELEBREX) 200 MG capsule TAKE ONE CAPSULE BY MOUTH ONE TIME DAILY WITH FOOD 90 capsule 0   cetirizine (ZYRTEC) 10 MG tablet Take 10 mg by mouth as needed for allergies.     lisdexamfetamine (  VYVANSE) 30 MG capsule TAKE ONE CAPSULE BY MOUTH EVERY MORNING, FILL 03-09-22     NONFORMULARY OR COMPOUNDED ITEM Trimix (30/1/10)-(Pap/Phent/PGE)  Dosage: Inject 0.8 cc per injection   Vial 1ml  Qty #10 Refills 6  [facility] [phone] Fax [phone] 10 each 6   Semaglutide-Weight Management (WEGOVY) 2.4 MG/0.75ML SOAJ Inject  2.4 mg into the skin once a week.     sildenafil (VIAGRA) 100 MG tablet Take 1 tablet (100 mg total) by mouth daily as needed for erectile dysfunction. Take two hours prior to intercourse on an empty stomach 30 tablet 6   tadalafil (CIALIS) 5 MG tablet TAKE ONE TABLET BY MOUTH ONE TIME DAILY 90 tablet 3   valACYclovir (VALTREX) 1000 MG tablet TAKE TWO TABLETS BY MOUTH TWICE A DAY AS NEEDED (FOR ONE DAY) 20 tablet 11   warfarin (JANTOVEN) 3 MG tablet TAKE ONE TABLET BY MOUTH ONE TIME DAILY OR AS INSTRUCTED BY COUMADIN CLINIC 110 tablet 0   XARELTO 20 MG TABS tablet Take 20 mg by mouth daily.      Pertinent medications related to GI and procedure were reviewed by me with the patient prior to the procedure   Current Facility-Administered Medications:    0.9 %  sodium chloride infusion, , Intravenous, Continuous, Roma, Bierlein, DO, Last Rate: 20 mL/hr at 10/12/23 1211, New Bag at 10/12/23 1211  sodium chloride 20 mL/hr at 10/12/23 1211       No Known Allergies Allergies were reviewed by me prior to the procedure  Objective   Body mass index is 29.7 kg/m. Vitals:   10/12/23 1158  BP: (!) 161/99  Pulse: 75  Resp: 18  Temp: (!) 97.2 F (36.2 C)  TempSrc: Temporal  SpO2: 99%  Weight: 99.3 kg  Height: 6' (1.829 m)     Physical Exam Vitals and nursing note reviewed.  Constitutional:      General: He is not in acute distress.    Appearance: Normal appearance. He is not ill-appearing, toxic-appearing or diaphoretic.  HENT:     Head: Normocephalic and atraumatic.     Nose: Nose normal.     Mouth/Throat:     Mouth: Mucous membranes are moist.     Pharynx: Oropharynx is clear.  Eyes:     General: No scleral icterus.    Extraocular Movements: Extraocular movements intact.  Cardiovascular:     Rate and Rhythm: Normal rate and regular rhythm.     Heart sounds: Normal heart sounds. No murmur heard.    No friction rub. No gallop.  Pulmonary:     Effort: Pulmonary effort  is normal. No respiratory distress.     Breath sounds: Normal breath sounds. No wheezing, rhonchi or rales.  Abdominal:     General: Bowel sounds are normal. There is no distension.     Palpations: Abdomen is soft.     Tenderness: There is no abdominal tenderness. There is no guarding or rebound.  Musculoskeletal:     Cervical back: Neck supple.     Right lower leg: No edema.     Left lower leg: No edema.  Skin:    General: Skin is warm and dry.     Coloration: Skin is not jaundiced or pale.  Neurological:     General: No focal deficit present.     Mental Status: He is alert and oriented to person, place, and time. Mental status is at baseline.  Psychiatric:        Mood and  Affect: Mood normal.        Behavior: Behavior normal.        Thought Content: Thought content normal.        Judgment: Judgment normal.      Assessment:  [name] is a 58 y.o. male  who presents today for Colonoscopy for Personal history of colon polyps .  Plan:  Colonoscopy with possible intervention today  Colonoscopy with possible biopsy, control of bleeding, polypectomy, and interventions as necessary has been discussed with the patient/patient representative. Informed consent was obtained from the patient/patient representative after explaining the indication, nature, and risks of the procedure including but not limited to death, bleeding, perforation, missed neoplasm/lesions, cardiorespiratory compromise, and reaction to medications. Opportunity for questions was given and appropriate answers were provided. Patient/patient representative has verbalized understanding is amenable to undergoing the procedure.   Jaynie Collins, DO  [facility]  Portions of the record may have been created with voice recognition software. Occasional wrong-word or 'sound-a-like' substitutions may have occurred due to the inherent limitations of voice recognition software.  Read the chart  carefully and recognize, using context, where substitutions may have occurred.

## 2023-10-12 NOTE — Transfer of Care (Signed)
 Immediate Anesthesia Transfer of Care Note  Patient: DELMAN GOSHORN  Procedure(s) Performed: COLONOSCOPY WITH PROPOFOL  Patient Location: PACU  Anesthesia Type:MAC  Level of Consciousness: drowsy  Airway & Oxygen Therapy: Patient Spontanous Breathing  Post-op Assessment: Report given to RN and Post -op Vital signs reviewed and stable  Post vital signs: Reviewed and stable  Last Vitals:  Vitals Value Taken Time  BP 102/69 10/12/23 1317  Temp 35.8 C 10/12/23 1316  Pulse 78 10/12/23 1318  Resp 14 10/12/23 1318  SpO2 96 % 10/12/23 1318  Vitals shown include unfiled device data.  Last Pain:  Vitals:   10/12/23 1316  TempSrc: Temporal  PainSc: Asleep         Complications: No notable events documented.

## 2023-10-12 NOTE — Anesthesia Postprocedure Evaluation (Signed)
 Anesthesia Post Note  Patient: Ronald Hatfield  Procedure(s) Performed: COLONOSCOPY WITH PROPOFOL  Patient location during evaluation: Endoscopy Anesthesia Type: General Level of consciousness: awake and alert Pain management: pain level controlled Vital Signs Assessment: post-procedure vital signs reviewed and stable Respiratory status: spontaneous breathing, nonlabored ventilation, respiratory function stable and patient connected to nasal cannula oxygen Cardiovascular status: blood pressure returned to baseline and stable Postop Assessment: no apparent nausea or vomiting Anesthetic complications: no   No notable events documented.   Last Vitals:  Vitals:   10/12/23 1327 10/12/23 1340  BP: 108/86 126/84  Pulse: 74 73  Resp: 15 16  Temp:    SpO2: 96% 100%    Last Pain:  Vitals:   10/12/23 1340  TempSrc:   PainSc: 0-No pain                 Lenard Simmer

## 2023-10-12 NOTE — Op Note (Signed)
 [facility] Gastroenterology Patient Name: Ronald Hatfield Procedure Date: 10/12/2023 12:41 PM MRN: [id number] Account #: [id number] Date of Birth: January 10, 1966 Admit Type: Outpatient Age: 58 Room: [facility] ENDO ROOM 1 Gender: Male Note Status: Finalized Instrument Name: Colonoscope [id number] Procedure:             Colonoscopy Indications:           High risk colon cancer surveillance: Personal history                         of colonic polyps Providers:             Trenda Moots, DO Referring MD:          Marya Amsler. Dareen Piano MD, MD (Referring MD) Medicines:             Monitored Anesthesia Care Complications:         No immediate complications. Estimated blood loss:                         Minimal. Procedure:             Pre-Anesthesia Assessment:                        - Prior to the procedure, a History and Physical was                         performed, and patient medications and allergies were                         reviewed. The patient is competent. The risks and                         benefits of the procedure and the sedation options and                         risks were discussed with the patient. All questions                         were answered and informed consent was obtained.                         Patient identification and proposed procedure were                         verified by the physician, the nurse, the anesthetist                         and the technician in the endoscopy suite. Mental                         Status Examination: alert and oriented. Airway                         Examination: normal oropharyngeal airway and neck                         mobility. Respiratory Examination: clear to  auscultation. CV Examination: RRR, no murmurs, no S3                         or S4. Prophylactic Antibiotics: The patient does not                         require prophylactic antibiotics. Prior                          Anticoagulants: The patient has taken no anticoagulant                         or antiplatelet agents. ASA Grade Assessment: II - A                         patient with mild systemic disease. After reviewing                         the risks and benefits, the patient was deemed in                         satisfactory condition to undergo the procedure. The                         anesthesia plan was to use monitored anesthesia care                         (MAC). Immediately prior to administration of                         medications, the patient was re-assessed for adequacy                         to receive sedatives. The heart rate, respiratory                         rate, oxygen saturations, blood pressure, adequacy of                         pulmonary ventilation, and response to care were                         monitored throughout the procedure. The physical                         status of the patient was re-assessed after the                         procedure.                        After obtaining informed consent, the colonoscope was                         passed under direct vision. Throughout the procedure,                         the patient's blood pressure, pulse, and oxygen  saturations were monitored continuously. The                         Colonoscope was introduced through the anus and                         advanced to the the terminal ileum, with                         identification of the appendiceal orifice and IC                         valve. The colonoscopy was performed without                         difficulty. The patient tolerated the procedure well.                         The quality of the bowel preparation was evaluated                         using the BBPS [facility] Bowel Preparation Scale) with                         scores of: Right Colon = 3, Transverse Colon = 3 and                         Left Colon = 3 (entire  mucosa seen well with no                         residual staining, small fragments of stool or opaque                         liquid). The total BBPS score equals 9. The terminal                         ileum, ileocecal valve, appendiceal orifice, and                         rectum were photographed. Findings:      The perianal and digital rectal examinations were normal. Pertinent       negatives include normal sphincter tone.      The terminal ileum appeared normal. Estimated blood loss: none.      Retroflexion in the right colon was performed.      Non-bleeding internal hemorrhoids were found during retroflexion. The       hemorrhoids were Grade I (internal hemorrhoids that do not prolapse).       Estimated blood loss: none.      Seven sessile polyps were found in the rectum (3), sigmoid colon (2) and       ascending colon (2). The polyps were 1 to 2 mm in size. These polyps       were removed with a jumbo cold forceps. Resection and retrieval were       complete. Estimated blood loss was minimal.      A 6 to 7 mm polyp was found in the transverse colon. The polyp was       sessile.  The polyp was removed with a cold snare. Resection and       retrieval were complete. Estimated blood loss was minimal.      A tattoo was seen at 30 cm proximal to the anus. The tattoo site       appeared normal. Estimated blood loss: none.      The exam was otherwise without abnormality on direct and retroflexion       views. Impression:            - The examined portion of the ileum was normal.                        - Non-bleeding internal hemorrhoids.                        - Seven 1 to 2 mm polyps in the rectum, in the sigmoid                         colon and in the ascending colon, removed with a jumbo                         cold forceps. Resected and retrieved.                        - One 6 to 7 mm polyp in the transverse colon, removed                         with a cold snare. Resected and  retrieved.                        - A tattoo was seen at 30 cm proximal to the anus. The                         tattoo site appeared normal.                        - The examination was otherwise normal on direct and                         retroflexion views. Recommendation:        - Patient has a contact number available for                         emergencies. The signs and symptoms of potential                         delayed complications were discussed with the patient.                         Return to normal activities tomorrow. Written                         discharge instructions were provided to the patient.                        - Discharge patient to home.                        -  Resume previous diet.                        - Continue present medications.                        - No ibuprofen, naproxen, or other non-steroidal                         anti-inflammatory drugs for 5 days after polyp removal.                        - Resume Xarelto (rivaroxaban) at prior dose in 2                         days. Refer to managing physician for further                         adjustment of therapy.                        - Ok to restart [facility]                        - Await pathology results.                        - Repeat colonoscopy for surveillance based on                         pathology results.                        - Return to referring physician as previously                         scheduled.                        - The findings and recommendations were discussed with                         the patient. Procedure Code(s):     --- Professional ---                        [phone], Colonoscopy, flexible; with removal of                         tumor(s), polyp(s), or other lesion(s) by snare                         technique                        [id number], 59, Colonoscopy, flexible; with biopsy, single                         or multiple Diagnosis Code(s):     ---  Professional ---                        Z86.010, Personal history of colonic polyps  K64.0, First degree hemorrhoids                        D12.8, Benign neoplasm of rectum                        D12.5, Benign neoplasm of sigmoid colon                        D12.2, Benign neoplasm of ascending colon                        D12.3, Benign neoplasm of transverse colon (hepatic                         flexure or splenic flexure) CPT copyright 2022 American Medical Association. All rights reserved. The codes documented in this report are preliminary and upon coder review may  be revised to meet current compliance requirements. Attending Participation:      I personally performed the entire procedure. Elfredia Nevins, DO Jaynie Collins DO, DO 10/12/2023 1:15:08 PM This report has been signed electronically. Number of Addenda: 0 Note Initiated On: 10/12/2023 12:41 PM Scope Withdrawal Time: 0 hours 15 minutes 11 seconds  Total Procedure Duration: 0 hours 16 minutes 36 seconds  Estimated Blood Loss:  Estimated blood loss was minimal.      [facility]

## 2023-10-12 NOTE — Anesthesia Preprocedure Evaluation (Addendum)
 Anesthesia Evaluation  Patient identified by MRN, date of birth, ID band Patient awake    Reviewed: Allergy & Precautions, H&P , NPO status , Patient's Chart, lab work & pertinent test results, reviewed documented beta blocker date and time   History of Anesthesia Complications Negative for: history of anesthetic complications  Airway Mallampati: III  TM Distance: >3 FB Neck ROM: full    Dental  (+) Dental Advidsory Given, Caps, Teeth Intact   Pulmonary neg pulmonary ROS   Pulmonary exam normal breath sounds clear to auscultation       Cardiovascular Exercise Tolerance: Good negative cardio ROS Normal cardiovascular exam Rhythm:regular Rate:Normal  Hx DVT/PE on warfarin and xarelto   Neuro/Psych  PSYCHIATRIC DISORDERS (ADHD)  Depression    negative neurological ROS  negative psych ROS   GI/Hepatic negative GI ROS, Neg liver ROS,,,  Endo/Other  negative endocrine ROS  Obesity   Renal/GU negative Renal ROS   BPH negative genitourinary   Musculoskeletal   Abdominal   Peds  Hematology negative hematology ROS (+) Blood dyscrasia (protein C deficiency)   Anesthesia Other Findings Past Medical History: No date: Attention deficit disorder (ADD) without hyperactivity No date: Attention deficit disorder without mention of hyperactivity No date: Depressive disorder, not elsewhere classified No date: Dermatophytosis of the body No date: Encounter for therapeutic drug monitoring No date: Hereditary protein C deficiency (HCC) No date: History of DVT (deep vein thrombosis) No date: History of pulmonary embolus (PE) No date: Impotence of organic origin No date: Localized osteoarthrosis not specified whether primary or  secondary, hand No date: Long term (current) use of anticoagulants No date: Low serum testosterone level No date: Other abnormal glucose No date: Other pulmonary embolism and infarction     Comment:   Hospitalization 09/12/08-09/16/08 No date: Overweight(278.02) No date: Pulmonary emboli (HCC) No date: Pure hypercholesterolemia No date: RLS (restless legs syndrome)     Comment:  in distant past No date: Routine general medical examination at a health care facility No date: Unspecified viral infection, in conditions classified  elsewhere and of unspecified site   Reproductive/Obstetrics negative OB ROS                             Anesthesia Physical Anesthesia Plan  ASA: 2  Anesthesia Plan: General   Post-op Pain Management:    Induction: Intravenous  PONV Risk Score and Plan: 2 and Propofol infusion, TIVA and Treatment may vary due to age or medical condition  Airway Management Planned: Natural Airway and Nasal Cannula  Additional Equipment:   Intra-op Plan:   Post-operative Plan:   Informed Consent: I have reviewed the patients History and Physical, chart, labs and discussed the procedure including the risks, benefits and alternatives for the proposed anesthesia with the patient or authorized representative who has indicated his/her understanding and acceptance.     Dental Advisory Given  Plan Discussed with: Anesthesiologist, CRNA and Surgeon  Anesthesia Plan Comments:         Anesthesia Quick Evaluation

## 2023-10-15 ENCOUNTER — Encounter: Payer: Self-pay | Admitting: Gastroenterology

## 2023-10-15 LAB — SURGICAL PATHOLOGY

## 2023-12-03 ENCOUNTER — Other Ambulatory Visit: Payer: Self-pay | Admitting: Urology

## 2023-12-03 DIAGNOSIS — N529 Male erectile dysfunction, unspecified: Secondary | ICD-10-CM

## 2024-02-29 ENCOUNTER — Other Ambulatory Visit: Payer: Self-pay | Admitting: Urology

## 2024-02-29 MED ORDER — NONFORMULARY OR COMPOUNDED ITEM: 6 refills | Status: AC

## 2024-03-25 NOTE — Progress Notes (Signed)
 Goals     . Follow my doctor's care plan       PHQ 2/9 last 3 flowsheet values     01/31/2022 02/12/2023 03/25/2024  PHQ-9 Depression Screening   Little interest or pleasure in doing things  0 0  Feeling down, depressed, or hopeless  0 1  (OBSOLETE) Little interest or pleasure in doing things 0    (OBSOLETE) Feeling down, depressed, or hopeless (or irritable for Teens only)? 1    (OBSOLETE) Total Score = 1        Depression Severity and Treatment Recommendations:  0-4= None  5-9= Mild / Treatment: Support, educate to call if worse; return in one month  10-14= Moderate / Treatment: Support, watchful waiting; Antidepressant or Psychotherapy  15-19= Moderately severe / Treatment: Antidepressant OR Psychotherapy  >= 20 = Major depression, severe / Antidepressant AND Psychotherapy  Please note approximately 15 minutes was spent and depression screening by me and nursing staff.     Ronald Hatfield is a 58 y.o. male here for his annual exam with health maintenance   Chief Complaint  Annual exam   HISTORY OF PRESENT ILLNESS  Health maintenance examination Colon last 4-25 with polyp history and due in 3 years per path   Review of Systems; Constitutional; No weight loss, fever, chills, weakness  HEENT: No visual loss, blurred vision, hearing loss, ear pain, runny nose or sore throat SKIN; No rash or itching CARDIOVASCULAR; No chest pain, pressure. No palpitations or edema RESPIRATORY; No shortness of breath, cough or sputum GASTROINTESTINAL; No nausea, vomiting, diarrhea or dysphagia GENITOURINARY; No dysuria, new incontinence, suprapubic pain NEUROLOGICAL; No headache, dizziness, syncopy, tingling MUSCULOSKELETAL; No muscle or joint pain or injuries HEMATOLOGICAL; No anemia, bleeding or abnormal bruising noted PSYCHIATRIC; No manic symptoms or severely blue mood ENDOCRINE; No sweating, temperature intolerance or polyuria, polydipsia  Patient Active Problem List   Diagnosis  . History of pulmonary embolus (PE)  . Hereditary protein C deficiency (CMS/HHS-HCC)  . Class 2 severe obesity with serious comorbidity in adult (CMS/HHS-HCC)  . Attention deficit disorder (ADD) without hyperactivity  . Health maintenance examination  . Low serum testosterone  level    Past Medical History:  Diagnosis Date  . Anxiety 2007   have not been addressing in 5 years  . Depression 2007   have not been addressing for 5 years  . Hypertension 2023   possibly indicated in previous blood work this Spring.  . Sleep apnea 2016   weight induced    Past Surgical History:  Procedure Laterality Date  . Colon @ [facility]  10/12/2023   Tubular adenomas/Hyperplastic polyps/Repeat 36yrs/SMR  . VASECTOMY      Family History  Problem Relation Name Age of Onset  . Deep vein thrombosis (DVT or abnormal blood clot formation) Mother Aeric Burnham   . High blood pressure (Hypertension) Mother Lafe Clerk   . Alcohol abuse Father Tatem Fesler        Alcoholic - Sober since 28  . Anxiety Father Hardin Hardenbrook   . Colon polyps Father Christipher Rieger   . Depression Father Houa Ackert   . Anxiety Sister Suzann Jama Senters   . Deep vein thrombosis (DVT or abnormal blood clot formation) Sister Suzann Jama Senters   . Depression Sister Suzann Jama Senters   . Obesity Sister Suzann Jama Senters   . Thyroid  disease Sister Suzann Jama Senters        2014 - Hashimoto's  . Anxiety Brother Elsie Cornwall   .  Depression Brother Elsie Cornwall   . Thyroid  disease Brother Elsie Cornwall        2017 - Hashimoto's    No Known Allergies    Social History   Socioeconomic History  . Marital status: Married  Tobacco Use  . Smoking status: Never  . Smokeless tobacco: Former  Advertising Account Planner  . Vaping status: Never Used  Substance and Sexual Activity  . Alcohol use: Yes    Alcohol/week: 9.0 standard drinks of alcohol    Types: 9 Cans of beer per week    Comment: Tpically on weekend/golf course.  Currently not drinking alco  . Drug use: Not Currently    Types: Other-see comments  . Sexual activity: Yes    Partners: Female    Comment: Vasectomy - 2003   Social Drivers of Health   Financial Resource Strain: Low Risk  (03/24/2024)   Overall Financial Resource Strain (CARDIA)   . Difficulty of Paying Living Expenses: Not very hard  Food Insecurity: No Food Insecurity (03/24/2024)   Hunger Vital Sign   . Worried About Programme Researcher, Broadcasting/film/video in the Last Year: Never true   . Ran Out of Food in the Last Year: Never true  Transportation Needs: No Transportation Needs (03/24/2024)   PRAPARE - Transportation   . Lack of Transportation (Medical): No   . Lack of Transportation (Non-Medical): No  Housing Stability: Low Risk  (03/24/2024)   Housing Stability Vital Sign   . Unable to Pay for Housing in the Last Year: No   . Number of Times Moved in the Last Year: 0   . Homeless in the Last Year: No      Current Outpatient Medications:  .  celecoxib  (CELEBREX ) 200 MG capsule, Take 1 capsule (200 mg total) by mouth once daily, Disp: 90 capsule, Rfl: 3 .  lisdexamfetamine (VYVANSE ) 30 MG capsule, Take 1 capsule (30 mg total) by mouth every morning, Disp: 30 capsule, Rfl: 0 .  pantoprazole (PROTONIX) 40 MG DR tablet, TAKE ONE TABLET BY MOUTH ONE TIME DAILY, Disp: 30 tablet, Rfl: 5 .  tadalafiL  (CIALIS ) 5 MG tablet, Take 5 mg by mouth once daily, Disp: , Rfl:  .  valACYclovir  (VALTREX ) 1000 MG tablet, Take 1,000 mg by mouth 2 (two) times daily as needed, Disp: , Rfl:  .  WEGOVY 2.4 mg/0.75 mL pen injector, INJECT THE CONTENTS OF ONE PEN UNDER THE SKIN ONCE WEEKLY ON THE SAME DAY EACH WEEK, Disp: 3 mL, Rfl: 11 .  XARELTO 20 mg tablet, TAKE ONE TABLET BY MOUTH ONE TIME DAILY, Disp: 30 tablet, Rfl: 11  Vitals:   03/25/24 1323  BP: 134/79  Pulse: 84   Body mass index is 32.69 kg/m. No acute distress, pleasant  HEENT: Normocephalic and Atraumatic, Oropharynx is clear, Tympanic membranes clear,  conjunctiva have normal color NECK: No bruits, thyromegalia or adenopathy is noted CHEST; No distress, normal to inspection, clear to auscultation CARDIOVASCULAR; Regular rate and rhythm, no murmurs rubs or gallops appreciated. Peripheral pulses were palpated and present.  ABDOMEN; Soft and flat and nontender with bowel sounds appreciated in the normal range EXTREMITIES; No clubbing cyanosis or edema NEUROLOGICAL; Alert and responsive with good insight. Motor function and sensation are intact. Reflexes are present SKIN; No suspicious lesions are noted.    Appointment on 03/18/2024  Component Date Value Ref Range Status  . WBC (White Blood Cell Count) 03/18/2024 4.2  4.1 - 10.2 10^3/uL Final  . RBC (Red Blood Cell Count) 03/18/2024 4.88  4.69 - 6.13 10^6/uL Final  . Hemoglobin 03/18/2024 15.3  14.1 - 18.1 gm/dL Final  . Hematocrit 90/83/7974 42.6  40.0 - 52.0 % Final  . MCV (Mean Corpuscular Volume) 03/18/2024 87.3  80.0 - 100.0 fl Final  . MCH (Mean Corpuscular Hemoglobin) 03/18/2024 31.4 (H)  27.0 - 31.2 pg Final  . MCHC (Mean Corpuscular Hemoglobin * 03/18/2024 35.9  32.0 - 36.0 gm/dL Final  . Platelet Count 03/18/2024 231  150 - 450 10^3/uL Final  . RDW-CV (Red Cell Distribution Widt* 03/18/2024 12.2  11.6 - 14.8 % Final  . MPV (Mean Platelet Volume) 03/18/2024 10.0  9.4 - 12.4 fl Final  . Glucose 03/18/2024 107  70 - 110 mg/dL Final  . Sodium 90/83/7974 142  136 - 145 mmol/L Final  . Potassium 03/18/2024 4.3  3.6 - 5.1 mmol/L Final  . Chloride 03/18/2024 107  97 - 109 mmol/L Final  . Carbon Dioxide (CO2) 03/18/2024 31.0  22.0 - 32.0 mmol/L Final  . Urea Nitrogen (BUN) 03/18/2024 17  7 - 25 mg/dL Final  . Creatinine 90/83/7974 1.1  0.7 - 1.3 mg/dL Final  . Glomerular Filtration Rate (eGFR) 03/18/2024 77  >60 mL/min/1.73sq m Final  . Calcium 03/18/2024 9.0  8.7 - 10.3 mg/dL Final  . AST  90/83/7974 20  8 - 39 U/L Final  . ALT  03/18/2024 25  6 - 57 U/L Final  . Alk Phos (alkaline  Phosphatase) 03/18/2024 60  34 - 104 U/L Final  . Albumin 03/18/2024 4.1  3.5 - 4.8 g/dL Final  . Bilirubin, Total 03/18/2024 0.7  0.3 - 1.2 mg/dL Final  . Protein, Total 03/18/2024 6.3  6.1 - 7.9 g/dL Final  . A/G Ratio 90/83/7974 1.9  1.0 - 5.0 gm/dL Final  . Hemoglobin J8R 03/18/2024 4.9  4.2 - 5.6 % Final  . Average Blood Glucose (Calc) 03/18/2024 94  mg/dL Final  . Cholesterol, Total 03/18/2024 196  100 - 200 mg/dL Final  . Triglyceride 90/83/7974 133  35 - 199 mg/dL Final  . HDL (High Density Lipoprotein) Cho* 03/18/2024 58.0  29.0 - 71.0 mg/dL Final  . LDL Calculated 03/18/2024 888  0 - 130 mg/dL Final  . VLDL Cholesterol 03/18/2024 27  mg/dL Final  . Cholesterol/HDL Ratio 03/18/2024 3.4   Final    ASSESSMENT  AND PLAN:  Diagnoses and all orders for this visit:  Routine general medical examination at a health care facility  Depression screening (Z13.31) -     Depression Screen -(PHQ- 2/9, BDI)  Attention deficit disorder (ADD) without hyperactivity -     lisdexamfetamine (VYVANSE ) 30 MG capsule; Take 1 capsule (30 mg total) by mouth every morning  Other orders -     celecoxib  (CELEBREX ) 200 MG capsule; Take 1 capsule (200 mg total) by mouth once daily      Goals     . Follow my doctor's care plan

## 2024-05-13 NOTE — Progress Notes (Unsigned)
 05/14/2024 9:50 AM   Ronald Hatfield December 23, 1965 [id number]  Referring provider: Lenon Layman ORN, MD [address] [facility] [address] [place],  [place] [address]  Urological history: 1. ED -Contributing factors are age, BPH, testosterone  deficiency, hyperlipidemia, Peyronie's disease and anticoagulation therapy -failed PDE5i's  -Trimix (30/1/10) 0.8   2. Hypogonadism -Contributing factors of age, obesity and hyperglycemia -discontinued secondary to clotting issues    3. BPH with LU TS -PSA (05/2023) 0.9 - Tadalafil  5 mg daily  No chief complaint on file.  HPI: Ronald Hatfield is a 58 y.o. male who presents today for follow up.   Previous records reviewed.   I PSS ***  He reports sensation of incomplete bladder emptying,   urinary frequency,   urinary intermittency,   urinary urgency,   a weak urinary stream,   having to strain to void,   nocturia x ***,   leaking before being able to reach the restroom,   leaking with coughing,   leaking without awareness,   and post void dribbling.     He is wearing *** pads//depends  daily.    Patient denies any modifying or aggravating factors.  Patient denies any recent UTI's, gross hematuria, dysuria or suprapubic/flank pain.  Patient denies any fevers, chills, nausea or vomiting.  ***  He has a family history of PCa, colon cancer, ovarian cancer and/or breast cancer with ***.   He does not have a family history of PCa, colon cancer, ovarian cancer, and/or breast cancer .***     UA***  PVR***  PSA pending   Serum creatinine (03/2024) 1.1, eGFR 7.7   Hemoglobin A1c (03/2024) 4.9  BPH meds: Tadalafil  5 mg daily  Fluid consumption: ***  SHIM ***  He does not have confidence that he could get and keep an erection, his erections are not firm enough for penetrative intercourse, he has difficulty maintaining his erections,  and he is not finding intercourse satisfactory for him.  ***  Patient  still having spontaneous erections.  ***   He denies any pain or curvature with erections.    He is not able to ejaculate, has pain with ejaculation, and has blood in his ejaculate fluid.   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, hand    Long term (current) use of anticoagulants    Low serum testosterone  level    Other abnormal glucose    Other pulmonary embolism and infarction    Hospitalization 09/12/08-09/16/08   Overweight(278.02)    Pulmonary emboli (HCC)    Pure hypercholesterolemia    RLS (restless legs syndrome)    in distant past   Routine general medical examination at a health care facility    Unspecified viral infection, in conditions classified elsewhere and of unspecified site     Surgical History: Past Surgical History:  Procedure Laterality Date   COLONOSCOPY WITH PROPOFOL  N/A 08/17/2023   Procedure: COLONOSCOPY  WITH PROPOFOL ;  Surgeon: Onita Ronald Sharper, DO;  [facility] ENDOSCOPY;  Service: Gastroenterology;  Laterality: N/A;   COLONOSCOPY WITH PROPOFOL  N/A 10/12/2023   Procedure: COLONOSCOPY WITH PROPOFOL ;  Surgeon: Onita Ronald Sharper, DO;  Location: [facility] ENDOSCOPY;  Service: Gastroenterology;  Laterality: N/A;   LIPOMA EXCISION  1994   Right clavicular head   VASECTOMY  02/07/02   Dr. Bartley    Home Medications:  Allergies as of 05/14/2024   No Known Allergies      Medication List        Accurate as of May 13, 2024  9:50 AM. If you have any questions, ask your nurse or doctor.          celecoxib  200 MG capsule Commonly known as: CELEBREX  TAKE ONE CAPSULE BY MOUTH ONE TIME DAILY WITH FOOD   cetirizine  10 MG tablet Commonly known as: ZYRTEC  Take 10 mg by mouth as needed for allergies.   lisdexamfetamine 30 MG capsule Commonly known as: VYVANSE  TAKE ONE CAPSULE BY MOUTH EVERY MORNING, FILL 03-09-22   NONFORMULARY OR COMPOUNDED ITEM Trimix (30/1/10)-(Pap/Phent/PGE)  Dosage: Inject 0.8 cc per injection   Vial 1ml  Qty #10 Refills 6  [facility] [phone] Fax [phone]   sildenafil  100 MG tablet Commonly known as: VIAGRA  TAKE ONE TABLET BY MOUTH ONE TIME DAILY AS NEEDED FOR ERECTILE DYSFUNCTION. TAKE TWO HOURS PRIOR TO INTERCOURSE ON AN EMPTY STOMACH.   tadalafil  5 MG tablet Commonly known as: CIALIS  TAKE ONE TABLET BY MOUTH ONE TIME DAILY   valACYclovir  1000 MG tablet Commonly known as: VALTREX  TAKE TWO TABLETS BY MOUTH TWICE A DAY AS NEEDED (FOR ONE DAY)   warfarin 3 MG tablet Commonly known as: Jantoven  TAKE ONE TABLET BY MOUTH ONE TIME DAILY OR AS INSTRUCTED BY COUMADIN  CLINIC   Wegovy 2.4 MG/0.75ML Soaj SQ injection Generic drug: semaglutide-weight management Inject 2.4 mg into the skin once a week.   Xarelto 20 MG Tabs tablet Generic drug: rivaroxaban Take 20 mg by mouth daily.        Allergies: No Known Allergies  Family History: Family History  Problem Relation Age of Onset   Alcohol abuse Father    Deep vein thrombosis Father        h/o phlebitis   Deep vein thrombosis Mother    Skin cancer Mother    Lung cancer Mother    Deep vein thrombosis Sister    Alzheimer's disease Paternal Grandmother    Alcohol abuse Maternal Grandfather    Diabetes Maternal Grandmother    Coronary artery disease Maternal Grandmother    Stroke Maternal Grandmother    Prostate cancer Neg Hx    Colon cancer Neg Hx    Other Neg Hx        pituitary dz     Social History:  reports that he has never smoked. He has quit using smokeless tobacco.  His smokeless tobacco use included chew. He reports current alcohol use. He reports that he does not use drugs.  ROS: Pertinent ROS in HPI  Physical Exam: There were no vitals taken for this visit.  Constitutional:  Well nourished. Alert and oriented, No acute distress. HEENT: [place] AT, moist mucus membranes.  Trachea midline, no masses. Cardiovascular: No clubbing, cyanosis, or edema. Respiratory: Normal respiratory effort, no increased work of breathing. GI: Abdomen is soft, non tender, non distended, no abdominal masses. Liver and spleen not palpable.  No hernias appreciated.  Stool sample for occult testing is not indicated.  GU: No CVA tenderness.  No bladder fullness or masses.  Patient with circumcised/uncircumcised phallus. ***Foreskin easily retracted***  Urethral meatus is patent.  No penile discharge. No penile lesions or rashes. Scrotum without lesions, cysts, rashes and/or edema.  Testicles are located scrotally bilaterally. No masses are appreciated in the testicles. Left and right epididymis are normal. Rectal: Patient with  normal sphincter tone. Anus and perineum without scarring or rashes. No rectal masses are appreciated. Prostate is approximately *** grams, *** nodules are appreciated. Seminal vesicles are normal. Skin: No rashes, bruises or suspicious lesions. Lymph: No cervical or inguinal adenopathy. Neurologic: Grossly intact, no focal deficits, moving all 4 extremities. Psychiatric: Normal mood and affect.   Laboratory Data: See Epic and HPI   I have reviewed the labs.   Pertinent Imaging: N/A  Assessment & Plan:    1. ED - ***  2. BPH with LU TS -PSA pending  -No bothersome symptoms   No follow-ups on file.  These notes generated with voice recognition software. I apologize for typographical errors.  CLOTILDA HELON RIGGERS  [facility] [address]  [address] [place], [place] [address] [phone]

## 2024-05-14 ENCOUNTER — Encounter: Payer: Self-pay | Admitting: Urology

## 2024-05-14 ENCOUNTER — Ambulatory Visit: Payer: Self-pay | Admitting: Urology

## 2024-05-14 VITALS — BP 137/94 | HR 82 | Ht 72.0 in | Wt 230.0 lb

## 2024-05-14 DIAGNOSIS — N401 Enlarged prostate with lower urinary tract symptoms: Secondary | ICD-10-CM

## 2024-05-14 DIAGNOSIS — N138 Other obstructive and reflux uropathy: Secondary | ICD-10-CM | POA: Diagnosis not present

## 2024-05-14 DIAGNOSIS — N529 Male erectile dysfunction, unspecified: Secondary | ICD-10-CM

## 2024-05-14 MED ORDER — TADALAFIL 20 MG PO TABS: ORAL_TABLET | ORAL | 3 refills | Status: AC

## 2024-05-14 MED ORDER — TADALAFIL 5 MG PO TABS: 5.0000 mg | ORAL_TABLET | Freq: Every day | ORAL | 3 refills | Status: AC

## 2024-05-15 ENCOUNTER — Ambulatory Visit: Payer: Self-pay | Admitting: Urology

## 2024-05-15 LAB — PSA: Prostate Specific Ag, Serum: 1.1 ng/mL (ref 0.0–4.0)

## 2025-05-14 ENCOUNTER — Ambulatory Visit: Admitting: Urology
